# Patient Record
Sex: Female | Born: 1955
Health system: Southern US, Community
[De-identification: ages and names within clinical notes are randomized; demographics above are authoritative.]

## PROBLEM LIST (undated history)

## (undated) DIAGNOSIS — I739 Peripheral vascular disease, unspecified: Secondary | ICD-10-CM

## (undated) DIAGNOSIS — E119 Type 2 diabetes mellitus without complications: Secondary | ICD-10-CM

## (undated) DIAGNOSIS — I219 Acute myocardial infarction, unspecified: Secondary | ICD-10-CM

## (undated) DIAGNOSIS — Z91199 Patient's noncompliance with other medical treatment and regimen due to unspecified reason: Secondary | ICD-10-CM

## (undated) DIAGNOSIS — E785 Hyperlipidemia, unspecified: Secondary | ICD-10-CM

## (undated) DIAGNOSIS — I2510 Atherosclerotic heart disease of native coronary artery without angina pectoris: Secondary | ICD-10-CM

## (undated) DIAGNOSIS — L932 Other local lupus erythematosus: Secondary | ICD-10-CM

## (undated) DIAGNOSIS — F329 Major depressive disorder, single episode, unspecified: Secondary | ICD-10-CM

## (undated) DIAGNOSIS — M1990 Unspecified osteoarthritis, unspecified site: Secondary | ICD-10-CM

## (undated) DIAGNOSIS — F32A Depression, unspecified: Secondary | ICD-10-CM

## (undated) DIAGNOSIS — F1911 Other psychoactive substance abuse, in remission: Secondary | ICD-10-CM

## (undated) DIAGNOSIS — R222 Localized swelling, mass and lump, trunk: Secondary | ICD-10-CM

## (undated) DIAGNOSIS — Z9119 Patient's noncompliance with other medical treatment and regimen: Secondary | ICD-10-CM

## (undated) DIAGNOSIS — R803 Bence Jones proteinuria: Principal | ICD-10-CM

## (undated) DIAGNOSIS — I209 Angina pectoris, unspecified: Secondary | ICD-10-CM

## (undated) DIAGNOSIS — F419 Anxiety disorder, unspecified: Secondary | ICD-10-CM

## (undated) HISTORY — DX: Other local lupus erythematosus: L93.2

## (undated) HISTORY — DX: Depression, unspecified: F32.A

## (undated) HISTORY — PX: ANTERIOR CRUCIATE LIGAMENT REPAIR: SHX115

## (undated) HISTORY — DX: Peripheral vascular disease, unspecified: I73.9

## (undated) HISTORY — DX: Bence Jones proteinuria: R80.3

## (undated) HISTORY — DX: Other psychoactive substance abuse, in remission: F19.11

## (undated) HISTORY — DX: Patient's noncompliance with other medical treatment and regimen: Z91.19

## (undated) HISTORY — PX: CORONARY ANGIOPLASTY WITH STENT PLACEMENT: SHX49

## (undated) HISTORY — PX: HERNIA REPAIR: SHX51

## (undated) HISTORY — DX: Anxiety disorder, unspecified: F41.9

## (undated) HISTORY — DX: Hyperlipidemia, unspecified: E78.5

## (undated) HISTORY — PX: BREAST SURGERY: SHX581

## (undated) HISTORY — PX: TONSILLECTOMY: SUR1361

## (undated) HISTORY — DX: Atherosclerotic heart disease of native coronary artery without angina pectoris: I25.10

## (undated) HISTORY — DX: Major depressive disorder, single episode, unspecified: F32.9

## (undated) HISTORY — DX: Patient's noncompliance with other medical treatment and regimen due to unspecified reason: Z91.199

## (undated) HISTORY — PX: EXCISIONAL HEMORRHOIDECTOMY: SHX1541

---

## 1997-07-16 DIAGNOSIS — L932 Other local lupus erythematosus: Secondary | ICD-10-CM

## 1997-07-16 HISTORY — PX: ABDOMINAL HYSTERECTOMY: SHX81

## 1997-07-16 HISTORY — DX: Other local lupus erythematosus: L93.2

## 1997-12-14 ENCOUNTER — Encounter: Admission: RE | Admit: 1997-12-14 | Discharge: 1998-03-14 | Payer: Self-pay | Admitting: Orthopedic Surgery

## 1998-01-08 ENCOUNTER — Emergency Department (HOSPITAL_COMMUNITY): Admission: EM | Admit: 1998-01-08 | Discharge: 1998-01-08 | Payer: Self-pay | Admitting: Emergency Medicine

## 1998-02-01 ENCOUNTER — Ambulatory Visit (HOSPITAL_COMMUNITY): Admission: RE | Admit: 1998-02-01 | Discharge: 1998-02-01 | Payer: Self-pay | Admitting: Orthopedic Surgery

## 1998-02-15 ENCOUNTER — Encounter: Admission: RE | Admit: 1998-02-15 | Discharge: 1998-05-16 | Payer: Self-pay | Admitting: Orthopedic Surgery

## 1998-06-19 ENCOUNTER — Emergency Department (HOSPITAL_COMMUNITY): Admission: EM | Admit: 1998-06-19 | Discharge: 1998-06-19 | Payer: Self-pay | Admitting: Emergency Medicine

## 1998-07-08 ENCOUNTER — Emergency Department (HOSPITAL_COMMUNITY): Admission: EM | Admit: 1998-07-08 | Discharge: 1998-07-08 | Payer: Self-pay | Admitting: *Deleted

## 1998-10-25 ENCOUNTER — Inpatient Hospital Stay (HOSPITAL_COMMUNITY): Admission: RE | Admit: 1998-10-25 | Discharge: 1998-10-27 | Payer: Self-pay | Admitting: *Deleted

## 1999-02-21 ENCOUNTER — Encounter: Payer: Self-pay | Admitting: Family Medicine

## 1999-02-21 ENCOUNTER — Ambulatory Visit (HOSPITAL_COMMUNITY): Admission: RE | Admit: 1999-02-21 | Discharge: 1999-02-21 | Payer: Self-pay | Admitting: Family Medicine

## 1999-02-23 ENCOUNTER — Ambulatory Visit (HOSPITAL_COMMUNITY): Admission: RE | Admit: 1999-02-23 | Discharge: 1999-02-23 | Payer: Self-pay | Admitting: Family Medicine

## 1999-02-23 ENCOUNTER — Encounter: Payer: Self-pay | Admitting: Family Medicine

## 1999-06-05 ENCOUNTER — Other Ambulatory Visit: Admission: RE | Admit: 1999-06-05 | Discharge: 1999-06-05 | Payer: Self-pay | Admitting: *Deleted

## 1999-08-21 ENCOUNTER — Emergency Department (HOSPITAL_COMMUNITY): Admission: EM | Admit: 1999-08-21 | Discharge: 1999-08-21 | Payer: Self-pay | Admitting: *Deleted

## 1999-08-30 ENCOUNTER — Ambulatory Visit (HOSPITAL_COMMUNITY): Admission: RE | Admit: 1999-08-30 | Discharge: 1999-08-31 | Payer: Self-pay | Admitting: Cardiology

## 2000-03-01 ENCOUNTER — Emergency Department (HOSPITAL_COMMUNITY): Admission: EM | Admit: 2000-03-01 | Discharge: 2000-03-01 | Payer: Self-pay | Admitting: Emergency Medicine

## 2000-03-01 ENCOUNTER — Encounter: Payer: Self-pay | Admitting: Emergency Medicine

## 2000-03-26 ENCOUNTER — Encounter: Payer: Self-pay | Admitting: Emergency Medicine

## 2000-03-26 ENCOUNTER — Emergency Department (HOSPITAL_COMMUNITY): Admission: EM | Admit: 2000-03-26 | Discharge: 2000-03-26 | Payer: Self-pay | Admitting: Emergency Medicine

## 2000-05-13 ENCOUNTER — Encounter: Payer: Self-pay | Admitting: Emergency Medicine

## 2000-05-13 ENCOUNTER — Emergency Department (HOSPITAL_COMMUNITY): Admission: EM | Admit: 2000-05-13 | Discharge: 2000-05-13 | Payer: Self-pay | Admitting: Emergency Medicine

## 2000-06-11 ENCOUNTER — Other Ambulatory Visit: Admission: RE | Admit: 2000-06-11 | Discharge: 2000-06-11 | Payer: Self-pay | Admitting: *Deleted

## 2000-07-02 ENCOUNTER — Ambulatory Visit (HOSPITAL_COMMUNITY): Admission: RE | Admit: 2000-07-02 | Discharge: 2000-07-03 | Payer: Self-pay | Admitting: Orthopedic Surgery

## 2000-08-01 ENCOUNTER — Inpatient Hospital Stay (HOSPITAL_COMMUNITY): Admission: RE | Admit: 2000-08-01 | Discharge: 2000-08-04 | Payer: Self-pay | Admitting: Orthopedic Surgery

## 2000-08-13 ENCOUNTER — Encounter: Admission: RE | Admit: 2000-08-13 | Discharge: 2000-11-11 | Payer: Self-pay | Admitting: Orthopedic Surgery

## 2000-11-12 ENCOUNTER — Encounter: Admission: RE | Admit: 2000-11-12 | Discharge: 2000-11-21 | Payer: Self-pay | Admitting: Orthopedic Surgery

## 2000-11-22 ENCOUNTER — Encounter: Admission: RE | Admit: 2000-11-22 | Discharge: 2000-11-22 | Payer: Self-pay | Admitting: Orthopedic Surgery

## 2000-11-22 ENCOUNTER — Encounter: Payer: Self-pay | Admitting: Orthopedic Surgery

## 2001-01-07 ENCOUNTER — Emergency Department (HOSPITAL_COMMUNITY): Admission: EM | Admit: 2001-01-07 | Discharge: 2001-01-07 | Payer: Self-pay | Admitting: Emergency Medicine

## 2001-05-04 ENCOUNTER — Emergency Department (HOSPITAL_COMMUNITY): Admission: EM | Admit: 2001-05-04 | Discharge: 2001-05-04 | Payer: Self-pay | Admitting: Emergency Medicine

## 2001-05-25 ENCOUNTER — Emergency Department (HOSPITAL_COMMUNITY): Admission: EM | Admit: 2001-05-25 | Discharge: 2001-05-25 | Payer: Self-pay | Admitting: Emergency Medicine

## 2001-05-26 ENCOUNTER — Encounter: Payer: Self-pay | Admitting: Emergency Medicine

## 2001-05-26 ENCOUNTER — Emergency Department (HOSPITAL_COMMUNITY): Admission: EM | Admit: 2001-05-26 | Discharge: 2001-05-26 | Payer: Self-pay | Admitting: Emergency Medicine

## 2001-05-28 ENCOUNTER — Other Ambulatory Visit: Admission: RE | Admit: 2001-05-28 | Discharge: 2001-05-28 | Payer: Self-pay | Admitting: *Deleted

## 2001-05-30 ENCOUNTER — Ambulatory Visit (HOSPITAL_COMMUNITY): Admission: RE | Admit: 2001-05-30 | Discharge: 2001-05-31 | Payer: Self-pay | Admitting: Cardiology

## 2001-06-30 ENCOUNTER — Emergency Department (HOSPITAL_COMMUNITY): Admission: EM | Admit: 2001-06-30 | Discharge: 2001-06-30 | Payer: Self-pay | Admitting: *Deleted

## 2001-09-22 ENCOUNTER — Encounter: Admission: RE | Admit: 2001-09-22 | Discharge: 2001-10-22 | Payer: Self-pay | Admitting: Orthopedic Surgery

## 2002-02-22 ENCOUNTER — Emergency Department (HOSPITAL_COMMUNITY): Admission: EM | Admit: 2002-02-22 | Discharge: 2002-02-22 | Payer: Self-pay | Admitting: Emergency Medicine

## 2002-03-22 ENCOUNTER — Emergency Department (HOSPITAL_COMMUNITY): Admission: EM | Admit: 2002-03-22 | Discharge: 2002-03-22 | Payer: Self-pay | Admitting: *Deleted

## 2002-04-09 ENCOUNTER — Encounter: Admission: RE | Admit: 2002-04-09 | Discharge: 2002-04-09 | Payer: Self-pay | Admitting: *Deleted

## 2002-04-09 ENCOUNTER — Encounter: Payer: Self-pay | Admitting: *Deleted

## 2002-04-28 ENCOUNTER — Ambulatory Visit (HOSPITAL_COMMUNITY): Admission: RE | Admit: 2002-04-28 | Discharge: 2002-04-28 | Payer: Self-pay | Admitting: Orthopedic Surgery

## 2002-04-29 ENCOUNTER — Encounter: Payer: Self-pay | Admitting: Orthopedic Surgery

## 2002-05-28 ENCOUNTER — Other Ambulatory Visit: Admission: RE | Admit: 2002-05-28 | Discharge: 2002-05-28 | Payer: Self-pay | Admitting: *Deleted

## 2003-04-29 ENCOUNTER — Encounter: Payer: Self-pay | Admitting: *Deleted

## 2003-04-29 ENCOUNTER — Encounter: Admission: RE | Admit: 2003-04-29 | Discharge: 2003-04-29 | Payer: Self-pay | Admitting: *Deleted

## 2003-05-28 ENCOUNTER — Emergency Department (HOSPITAL_COMMUNITY): Admission: EM | Admit: 2003-05-28 | Discharge: 2003-05-29 | Payer: Self-pay | Admitting: Emergency Medicine

## 2003-06-03 ENCOUNTER — Other Ambulatory Visit: Admission: RE | Admit: 2003-06-03 | Discharge: 2003-06-03 | Payer: Self-pay | Admitting: *Deleted

## 2003-07-29 ENCOUNTER — Emergency Department (HOSPITAL_COMMUNITY): Admission: EM | Admit: 2003-07-29 | Discharge: 2003-07-29 | Payer: Self-pay | Admitting: Emergency Medicine

## 2003-10-09 ENCOUNTER — Emergency Department (HOSPITAL_COMMUNITY): Admission: EM | Admit: 2003-10-09 | Discharge: 2003-10-09 | Payer: Self-pay | Admitting: Emergency Medicine

## 2003-10-16 ENCOUNTER — Emergency Department (HOSPITAL_COMMUNITY): Admission: EM | Admit: 2003-10-16 | Discharge: 2003-10-16 | Payer: Self-pay | Admitting: Emergency Medicine

## 2003-10-28 ENCOUNTER — Encounter: Admission: RE | Admit: 2003-10-28 | Discharge: 2003-12-16 | Payer: Self-pay | Admitting: Orthopedic Surgery

## 2003-11-26 ENCOUNTER — Encounter: Admission: RE | Admit: 2003-11-26 | Discharge: 2003-12-07 | Payer: Self-pay | Admitting: Orthopaedic Surgery

## 2004-03-03 ENCOUNTER — Emergency Department (HOSPITAL_COMMUNITY): Admission: EM | Admit: 2004-03-03 | Discharge: 2004-03-03 | Payer: Self-pay

## 2004-05-02 ENCOUNTER — Inpatient Hospital Stay (HOSPITAL_COMMUNITY): Admission: EM | Admit: 2004-05-02 | Discharge: 2004-05-03 | Payer: Self-pay | Admitting: Emergency Medicine

## 2004-05-22 ENCOUNTER — Encounter: Admission: RE | Admit: 2004-05-22 | Discharge: 2004-05-22 | Payer: Self-pay | Admitting: *Deleted

## 2004-06-01 ENCOUNTER — Other Ambulatory Visit: Admission: RE | Admit: 2004-06-01 | Discharge: 2004-06-01 | Payer: Self-pay | Admitting: *Deleted

## 2004-10-29 ENCOUNTER — Emergency Department (HOSPITAL_COMMUNITY): Admission: EM | Admit: 2004-10-29 | Discharge: 2004-10-29 | Payer: Self-pay | Admitting: Emergency Medicine

## 2005-02-10 ENCOUNTER — Emergency Department (HOSPITAL_COMMUNITY): Admission: EM | Admit: 2005-02-10 | Discharge: 2005-02-10 | Payer: Self-pay | Admitting: Emergency Medicine

## 2005-02-18 ENCOUNTER — Emergency Department (HOSPITAL_COMMUNITY): Admission: EM | Admit: 2005-02-18 | Discharge: 2005-02-18 | Payer: Self-pay | Admitting: Emergency Medicine

## 2005-04-09 ENCOUNTER — Emergency Department (HOSPITAL_COMMUNITY): Admission: EM | Admit: 2005-04-09 | Discharge: 2005-04-10 | Payer: Self-pay | Admitting: Emergency Medicine

## 2005-06-11 ENCOUNTER — Other Ambulatory Visit: Admission: RE | Admit: 2005-06-11 | Discharge: 2005-06-11 | Payer: Self-pay | Admitting: *Deleted

## 2005-06-13 ENCOUNTER — Encounter: Admission: RE | Admit: 2005-06-13 | Discharge: 2005-06-13 | Payer: Self-pay | Admitting: *Deleted

## 2005-09-02 ENCOUNTER — Emergency Department (HOSPITAL_COMMUNITY): Admission: EM | Admit: 2005-09-02 | Discharge: 2005-09-02 | Payer: Self-pay | Admitting: Emergency Medicine

## 2005-09-04 ENCOUNTER — Ambulatory Visit (HOSPITAL_COMMUNITY): Admission: RE | Admit: 2005-09-04 | Discharge: 2005-09-04 | Payer: Self-pay | Admitting: Family Medicine

## 2006-06-13 ENCOUNTER — Other Ambulatory Visit: Admission: RE | Admit: 2006-06-13 | Discharge: 2006-06-13 | Payer: Self-pay | Admitting: *Deleted

## 2006-06-14 ENCOUNTER — Emergency Department (HOSPITAL_COMMUNITY): Admission: EM | Admit: 2006-06-14 | Discharge: 2006-06-14 | Payer: Self-pay | Admitting: Emergency Medicine

## 2006-06-17 ENCOUNTER — Encounter: Admission: RE | Admit: 2006-06-17 | Discharge: 2006-06-17 | Payer: Self-pay | Admitting: *Deleted

## 2006-07-30 ENCOUNTER — Inpatient Hospital Stay (HOSPITAL_COMMUNITY): Admission: AD | Admit: 2006-07-30 | Discharge: 2006-07-31 | Payer: Self-pay | Admitting: Cardiology

## 2006-10-08 ENCOUNTER — Encounter: Admission: RE | Admit: 2006-10-08 | Discharge: 2006-10-08 | Payer: Self-pay | Admitting: Cardiology

## 2006-11-19 ENCOUNTER — Encounter: Admission: RE | Admit: 2006-11-19 | Discharge: 2006-11-19 | Payer: Self-pay | Admitting: Family Medicine

## 2007-02-12 ENCOUNTER — Encounter: Admission: RE | Admit: 2007-02-12 | Discharge: 2007-05-13 | Payer: Self-pay | Admitting: Orthopaedic Surgery

## 2007-03-25 ENCOUNTER — Observation Stay (HOSPITAL_COMMUNITY): Admission: AD | Admit: 2007-03-25 | Discharge: 2007-03-26 | Payer: Self-pay | Admitting: Cardiology

## 2007-06-23 ENCOUNTER — Other Ambulatory Visit: Admission: RE | Admit: 2007-06-23 | Discharge: 2007-06-23 | Payer: Self-pay | Admitting: *Deleted

## 2007-07-14 ENCOUNTER — Encounter: Admission: RE | Admit: 2007-07-14 | Discharge: 2007-07-14 | Payer: Self-pay | Admitting: *Deleted

## 2008-01-28 ENCOUNTER — Emergency Department (HOSPITAL_COMMUNITY): Admission: EM | Admit: 2008-01-28 | Discharge: 2008-01-28 | Payer: Self-pay | Admitting: Emergency Medicine

## 2008-01-31 ENCOUNTER — Emergency Department (HOSPITAL_COMMUNITY): Admission: EM | Admit: 2008-01-31 | Discharge: 2008-01-31 | Payer: Self-pay | Admitting: Emergency Medicine

## 2008-03-19 ENCOUNTER — Emergency Department (HOSPITAL_BASED_OUTPATIENT_CLINIC_OR_DEPARTMENT_OTHER): Admission: EM | Admit: 2008-03-19 | Discharge: 2008-03-19 | Payer: Self-pay | Admitting: Emergency Medicine

## 2008-06-02 ENCOUNTER — Encounter: Admission: RE | Admit: 2008-06-02 | Discharge: 2008-06-02 | Payer: Self-pay | Admitting: Gastroenterology

## 2008-06-22 ENCOUNTER — Other Ambulatory Visit: Admission: RE | Admit: 2008-06-22 | Discharge: 2008-06-22 | Payer: Self-pay | Admitting: Gynecology

## 2008-08-09 ENCOUNTER — Encounter: Admission: RE | Admit: 2008-08-09 | Discharge: 2008-08-09 | Payer: Self-pay | Admitting: Gynecology

## 2009-01-03 ENCOUNTER — Inpatient Hospital Stay (HOSPITAL_COMMUNITY): Admission: EM | Admit: 2009-01-03 | Discharge: 2009-01-05 | Payer: Self-pay | Admitting: Emergency Medicine

## 2009-01-13 ENCOUNTER — Encounter (HOSPITAL_COMMUNITY): Admission: RE | Admit: 2009-01-13 | Discharge: 2009-04-13 | Payer: Self-pay | Admitting: Cardiology

## 2009-02-24 ENCOUNTER — Ambulatory Visit: Payer: Self-pay | Admitting: Surgery

## 2009-03-24 ENCOUNTER — Ambulatory Visit (HOSPITAL_COMMUNITY): Admission: RE | Admit: 2009-03-24 | Discharge: 2009-03-25 | Payer: Self-pay | Admitting: Cardiology

## 2009-08-22 ENCOUNTER — Encounter: Admission: RE | Admit: 2009-08-22 | Discharge: 2009-08-22 | Payer: Self-pay | Admitting: Gynecology

## 2009-11-19 ENCOUNTER — Emergency Department (HOSPITAL_COMMUNITY): Admission: EM | Admit: 2009-11-19 | Discharge: 2009-11-19 | Payer: Self-pay | Admitting: Emergency Medicine

## 2010-05-03 ENCOUNTER — Encounter: Admission: RE | Admit: 2010-05-03 | Discharge: 2010-05-03 | Payer: Self-pay | Admitting: Family Medicine

## 2010-05-08 ENCOUNTER — Ambulatory Visit: Payer: Self-pay | Admitting: Cardiology

## 2010-05-15 ENCOUNTER — Ambulatory Visit: Payer: Self-pay | Admitting: Cardiology

## 2010-09-15 ENCOUNTER — Other Ambulatory Visit: Payer: Self-pay | Admitting: Gynecology

## 2010-09-15 DIAGNOSIS — Z1231 Encounter for screening mammogram for malignant neoplasm of breast: Secondary | ICD-10-CM

## 2010-09-25 ENCOUNTER — Ambulatory Visit
Admission: RE | Admit: 2010-09-25 | Discharge: 2010-09-25 | Disposition: A | Discharge status: Home / Self Care | Payer: Federal, State, Local not specified - PPO | Source: Ambulatory Visit | Attending: Gynecology | Admitting: Gynecology

## 2010-09-25 DIAGNOSIS — Z1231 Encounter for screening mammogram for malignant neoplasm of breast: Secondary | ICD-10-CM

## 2010-10-09 ENCOUNTER — Other Ambulatory Visit (INDEPENDENT_AMBULATORY_CARE_PROVIDER_SITE_OTHER): Payer: Federal, State, Local not specified - PPO | Admitting: *Deleted

## 2010-10-09 ENCOUNTER — Other Ambulatory Visit: Payer: Self-pay | Admitting: Cardiology

## 2010-10-09 ENCOUNTER — Telehealth: Payer: Self-pay | Admitting: *Deleted

## 2010-10-09 DIAGNOSIS — R0789 Other chest pain: Secondary | ICD-10-CM

## 2010-10-09 DIAGNOSIS — Z79899 Other long term (current) drug therapy: Secondary | ICD-10-CM

## 2010-10-09 NOTE — Telephone Encounter (Signed)
Pt called, c/o bruising for several weeks and today pt developed a nose bleed.  Pt is unsure if bruising is coming from her Lupus or Plavix.  No other complaints.  Norma Fredrickson NP notified and instructed RN to have pt come in today for CBC.  Pt notified and will come in at 11 am.    KSpencer RN

## 2010-10-10 ENCOUNTER — Telehealth: Payer: Self-pay | Admitting: *Deleted

## 2010-10-10 LAB — CBC WITH DIFFERENTIAL/PLATELET
Eosinophils Relative: 1 % (ref 0–5)
HCT: 37.7 % (ref 36.0–46.0)
Lymphocytes Relative: 55 % — ABNORMAL HIGH (ref 12–46)
Lymphs Abs: 3.4 10*3/uL (ref 0.7–4.0)
MCV: 92.9 fL (ref 78.0–100.0)
Monocytes Absolute: 0.4 10*3/uL (ref 0.1–1.0)
Neutro Abs: 2.4 10*3/uL (ref 1.7–7.7)
Platelets: 291 10*3/uL (ref 150–400)
RBC: 4.06 MIL/uL (ref 3.87–5.11)
WBC: 6.2 10*3/uL (ref 4.0–10.5)

## 2010-10-10 NOTE — Telephone Encounter (Signed)
RN informed pt of lab results and instructed pt to cut back Plavix to every other day.  Also, pt told to remain on Aspirin 81 mg.  Pt will call back in two weeks and let us know how bruising is and if pt has had any more nose bleeds.

## 2010-10-10 NOTE — Telephone Encounter (Signed)
Message copied by Barnetta Hammersmith on Tue Oct 10, 2010  9:55 AM ------      Message from: Norma Fredrickson      Created: Tue Oct 10, 2010  8:27 AM       Hemoglobin and platelets are ok.If no stent in the past 12 months, then cut back the Plavix to every other day. Continue the low dose aspirin.

## 2010-10-20 LAB — COMPREHENSIVE METABOLIC PANEL
ALT: 21 U/L (ref 0–35)
Alkaline Phosphatase: 66 U/L (ref 39–117)
BUN: 13 mg/dL (ref 6–23)
CO2: 29 mEq/L (ref 19–32)
Chloride: 109 mEq/L (ref 96–112)
GFR calc non Af Amer: >60 mL/min (ref >60)
Glucose, Bld: 101 mg/dL — ABNORMAL HIGH (ref 70–99)
Potassium: 3.6 mEq/L (ref 3.5–5.1)
Sodium: 145 mEq/L (ref 135–145)
Total Bilirubin: 0.3 mg/dL (ref 0.3–1.2)

## 2010-10-20 LAB — POCT I-STAT, CHEM 8
Calcium, Ion: 1.23 mmol/L (ref 1.12–1.32)
Chloride: 110 mEq/L (ref 96–112)
Creatinine, Ser: 0.5 mg/dL (ref 0.4–1.2)
Glucose, Bld: 95 mg/dL (ref 70–99)
HCT: 36 % (ref 36.0–46.0)

## 2010-10-20 LAB — CBC
HCT: 34.7 % — ABNORMAL LOW (ref 36.0–46.0)
Hemoglobin: 11.4 g/dL — ABNORMAL LOW (ref 12.0–15.0)
RBC: 3.75 MIL/uL — ABNORMAL LOW (ref 3.87–5.11)
RDW: 14.8 % (ref 11.5–15.5)
WBC: 4.7 10*3/uL (ref 4.0–10.5)

## 2010-10-20 LAB — POCT I-STAT 3, ART BLOOD GAS (G3+)
Acid-base deficit: 1 mmol/L (ref 0.0–2.0)
Bicarbonate: 22.6 mEq/L (ref 20.0–24.0)
pCO2 arterial: 34 mmHg — ABNORMAL LOW (ref 35.0–45.0)
pO2, Arterial: 156 mmHg — ABNORMAL HIGH (ref 80.0–100.0)

## 2010-10-20 LAB — CARDIAC PANEL(CRET KIN+CKTOT+MB+TROPI)
CK, MB: 1 ng/mL (ref 0.3–4.0)
CK, MB: 1.2 ng/mL (ref 0.3–4.0)
Total CK: 93 U/L (ref 7–177)
Troponin I: 0.01 ng/mL (ref 0.00–0.06)

## 2010-10-20 LAB — D-DIMER, QUANTITATIVE: D-Dimer, Quant: <0.22 ug/mL-FEU (ref 0.00–0.48)

## 2010-10-23 ENCOUNTER — Telehealth: Payer: Self-pay | Admitting: Cardiology

## 2010-10-23 LAB — CBC
HCT: 38.5 % (ref 36.0–46.0)
Hemoglobin: 12.7 g/dL (ref 12.0–15.0)
MCV: 91 fL (ref 78.0–100.0)
MCV: 92.1 fL (ref 78.0–100.0)
Platelets: 270 10*3/uL (ref 150–400)
Platelets: 278 10*3/uL (ref 150–400)
Platelets: 300 10*3/uL (ref 150–400)
RBC: 3.83 MIL/uL — ABNORMAL LOW (ref 3.87–5.11)
RDW: 14.1 % (ref 11.5–15.5)
RDW: 14.1 % (ref 11.5–15.5)
WBC: 3.8 10*3/uL — ABNORMAL LOW (ref 4.0–10.5)
WBC: 4.2 10*3/uL (ref 4.0–10.5)

## 2010-10-23 LAB — DIFFERENTIAL
Basophils Absolute: 0 10*3/uL (ref 0.0–0.1)
Basophils Absolute: 0 10*3/uL (ref 0.0–0.1)
Basophils Relative: 0 % (ref 0–1)
Basophils Relative: 1 % (ref 0–1)
Eosinophils Absolute: 0 10*3/uL (ref 0.0–0.7)
Eosinophils Relative: 1 % (ref 0–5)
Lymphs Abs: 2.8 10*3/uL (ref 0.7–4.0)
Monocytes Absolute: 0.3 10*3/uL (ref 0.1–1.0)
Neutrophils Relative %: 27 % — ABNORMAL LOW (ref 43–77)

## 2010-10-23 LAB — CK TOTAL AND CKMB (NOT AT ARMC)
CK, MB: 1 ng/mL (ref 0.3–4.0)
CK, MB: 1.4 ng/mL (ref 0.3–4.0)
Relative Index: INVALID (ref 0.0–2.5)
Relative Index: INVALID (ref 0.0–2.5)
Total CK: 71 U/L (ref 7–177)

## 2010-10-23 LAB — COMPREHENSIVE METABOLIC PANEL
AST: 26 U/L (ref 0–37)
Albumin: 4.1 g/dL (ref 3.5–5.2)
Alkaline Phosphatase: 76 U/L (ref 39–117)
BUN: 13 mg/dL (ref 6–23)
CO2: 22 mEq/L (ref 19–32)
Chloride: 109 mEq/L (ref 96–112)
Creatinine, Ser: 0.6 mg/dL (ref 0.4–1.2)
GFR calc non Af Amer: >60 mL/min (ref >60)
Potassium: 3.9 mEq/L (ref 3.5–5.1)
Total Bilirubin: 0.4 mg/dL (ref 0.3–1.2)

## 2010-10-23 LAB — TROPONIN I: Troponin I: 0.02 ng/mL (ref 0.00–0.06)

## 2010-10-23 LAB — HEPARIN LEVEL (UNFRACTIONATED)
Heparin Unfractionated: 1.07 IU/mL — ABNORMAL HIGH (ref 0.30–0.70)
Heparin Unfractionated: 1.32 IU/mL — ABNORMAL HIGH (ref 0.30–0.70)
Heparin Unfractionated: <0.1 IU/mL — ABNORMAL LOW (ref 0.30–0.70)

## 2010-10-23 LAB — BASIC METABOLIC PANEL
Calcium: 9.3 mg/dL (ref 8.4–10.5)
Creatinine, Ser: 0.74 mg/dL (ref 0.4–1.2)
GFR calc Af Amer: >60 mL/min (ref >60)
GFR calc non Af Amer: >60 mL/min (ref >60)

## 2010-10-23 LAB — PROTIME-INR: Prothrombin Time: 11.7 seconds (ref 11.6–15.2)

## 2010-10-23 NOTE — Telephone Encounter (Signed)
[name], Ask Weyman Croon if she can just go to QOD schedule for her Plavix. She has not had a recent stent if I remember correctly.

## 2010-10-23 NOTE — Telephone Encounter (Signed)
Pt called, to inform RN that bruising is completely gone with the Plavix 75 mg every other day.  RN advised pt to continue Plavix 75 mg every other day.

## 2010-10-23 NOTE — Telephone Encounter (Signed)
Follow-up two week call question about continued use of Plavix after symptoms cleared.

## 2010-11-28 NOTE — Discharge Summary (Signed)
[name], [name] NO.:  [id number]   MEDICAL RECORD NO.:  [id number]          PATIENT TYPE:  INP   LOCATION:  3729                         FACILITY:  [facility]   PHYSICIAN:  Elmore Guise., M.D.DATE OF BIRTH:  July 30, 1955   DATE OF ADMISSION:  03/25/2007  DATE OF DISCHARGE:  03/26/2007                               DISCHARGE SUMMARY   DISCHARGE DIAGNOSES:  1. History of coronary artery disease.  2. Dyslipidemia.  3. Cutaneous lupus.  4. Anxiety and depression.  5. Tobacco dependence.  6. History of noncompliance.   HISTORY OF PRESENT ILLNESS:  [name] a very pleasant 55 year old  African American female who presented with increased stress and  substernal chest pain.  She was admitted for observation and to rule out  myocardial infarction.   HOSPITAL COURSE:  The patient's hospital course was uncomplicated.  On  arrival to the hospital the patient had significant improvement in her  symptoms.  With rest, she had no recurrence of her chest pain.  She  ruled out for myocardial function and serial EKGs showed no significant  changes.  She has now been up and ambulatory, back on her normal  medications with no further symptoms.  Her last cardiac evaluation was  earlier this year when she underwent cardiac catheterization showing  continued small vessel disease.  She will have further workup as an  outpatient.   DISCHARGE MEDICATIONS:  1. Aspirin 81 mg daily.  2. Vytorin 10/20 mg daily.  3. Plaquenil 200 mg daily.  4. Lexapro 10 mg daily.  5. Toprol XL 50 mg daily.  6. Nitroglycerin p.r.n. chest pain.  7. Ambien p.r.n. insomnia.   FOLLOW UP:  Her follow-up appointment will be with Dr. Tamera Reason cardiology on October 10.  She is to call the office with any  questions or concerns.  I did discuss the importance of  medication  compliance as well as total cessation of all tobacco products.  The  patient understands the importance of all of  these instructions.      Elmore Guise., M.D.  Electronically Signed    TWK/MEDQ  D:  03/27/2007  T:  03/27/2007  Job:  [id number]

## 2010-11-28 NOTE — Discharge Summary (Signed)
[name], [name] NO.:  [id number]   MEDICAL RECORD NO.:  [id number]          PATIENT TYPE:  INP   LOCATION:  2502                         FACILITY:  [facility]   PHYSICIAN:  Colleen Can. Deborah Chalk, M.D.DATE OF BIRTH:  03/16/1956   DATE OF ADMISSION:  01/03/2009  DATE OF DISCHARGE:  01/05/2009                               DISCHARGE SUMMARY   DISCHARGE DIAGNOSES:  1. Chest pain with subsequent elective cardiac catheterization and      subsequent stenting x2 to the right coronary artery.  2. Ongoing substance abuse.  3. Cutaneous lupus.  4. Anxiety and depression.  5. Hyperlipidemia, on Vytorin.  6. Known ischemic heart disease with remote PCI to the RCA.   HISTORY OF PRESENT ILLNESS:  [name] presented for admission after having  an approximate 1-week episode of neck burning.  She had been seen in the  office the week prior to admission with a burning sensation in her neck  and it had started after eating a pastrami sandwich.  She was placed on  proton pump inhibitor.  Her EKG was unchanged.  Her CK and troponin were  unremarkable.  Her symptoms were very different from her previous chest  pain syndrome.  She subsequently saw GI, and had a negative evaluation.  She subsequently presented with exertional neck burning and had more ST  and T wave changes inferiorly, and subsequently was admitted from the  office for further evaluation.   Please see the history and physical for further patient presentation and  profile.   LABORATORY DATA ON ADMISSION:  CBC was normal.  Chemistries were normal  except for glucose of 103.  Her cardiac enzymes were negative.  Her  chest x-ray showed slight hyperaeration and borderline cardiomegaly with  no active lung disease.   HOSPITAL COURSE:  The patient was admitted.  She was placed on IV  nitroglycerin and IV heparin.  Her cardiac enzymes were negative.  We  proceeded on with cardiac catheterization the following day.  That  procedure was tolerated well without any no known complications.  The  left main coronary artery was normal.  The LAD was normal.  The left  circumflex had 50-60% narrowings.  The intermediate was normal.  The  right coronary artery was 100% occluded prior to the old stent.  Subsequently 2 stents were placed with a 2.75 x 23 mm distal and a 2.75  x 28 mm proximal.  Overall, satisfactory result was obtained.  Postprocedure, she was transferred to 2500 and today on January 05, 2009,  she is doing well without complaints.  She has been up and ambulatory  without problems, groin unremarkable.  Postprocedure lab shows a glucose  of 127, BUN was 8, and creatinine 0.7.  Her white count was 3.8 and  platelets are 270.  Her hematocrit was 35.  Overall, she is felt to be a  satisfactory candidate for discharge today.   DISCHARGE CONDITION:  Stable.   DISCHARGE DIET:  Low-salt, heart-healthy.   WOUND CARE:  She is use an ice pack if needed to the groin.   She is not  to engage in any heavy lifting for 1 week.  She may shower  and bathe and her activity is to be increased as tolerated.   We will have her return to work next Tuesday.  We will plan on seeing  her back in the office in approximately 2 weeks and she is asked to call  to set that appointment up.   DISCHARGE MEDICINES:  1. Enteric-coated aspirin 325 a day.  2. Vytorin 10-40 daily.  3. Toprol-XL 25 mg a day.  4. Plavix 75 mg a day for the next 1 year.  5. Ambien p.r.n.  6. Nitroglycerin p.r.n.   She is to call if any problems arise in the interim.   Greater than 30 minutes spent for discharge.      [name], N.P.      Colleen Can. Deborah Chalk, M.D.  Electronically Signed    LC/MEDQ  D:  01/05/2009  T:  01/05/2009  Job:  [id number]

## 2010-11-28 NOTE — H&P (Signed)
[name], [name]               ACCOUNT NO.:  [id number]   MEDICAL RECORD NO.:  [id number]           PATIENT TYPE:  INP   LOCATION:                               FACILITY:  [facility]   PHYSICIAN:  Colleen Can. Deborah Chalk, M.D.DATE OF BIRTH:  01/24/1956   DATE OF ADMISSION:  01/03/2009  DATE OF DISCHARGE:                              HISTORY & PHYSICAL   CHIEF COMPLAINT:  Chest pain.   HISTORY OF PRESENT ILLNESS:  [name] comes into the office today as a work-  in appointment for recurrent chest pain.  She was seen last week with a  burning sensation in her neck.  That had started after eating a pastrami  sandwich.  She was placed on proton pump inhibitor.  She has had a  negative GI evaluation.  Now she is presenting with exertional neck  discomfort.  She has more ST and T-wave changes inferiorly.  She is  subsequently admitted for further evaluation.  At the time of her visit`  today, she is currently pain free.   PAST MEDICAL HISTORY:  1. Known ischemic heart disease.  She had remote angioplasty to the      right coronary in February 2001.  She had stent placement to the      right coronary in November 2002.  Her last catheterization was in      January 2008 and at that time showed an EF of 55-60%, totally      occluded continuation branch of the left circumflex in the AV      groove with right to left collaterals, 50-60% narrowing in the      proximal portion of the left circumflex, 20-30% narrowing in the      right coronary after the acute margin, but before the stent that      was previously placed with persistent patency of the stent in the      right coronary.  Her findings suggested that she could be safely      managed medically.  2. Ongoing substance abuse.  3. Cutaneous lupus.  4. Anxiety and depression.  5. Hyperlipidemia.  6. Past history of noncompliance.  7. History of Raynaud's.  8. History of iritis.   SURGICAL HISTORY:  1. Hemorrhoidectomy.  2. Partial  hysterectomy in 1999 and subsequent total hysterectomy in      2000.  3. Right knee surgery in 1998 and 1999.  4. Left inguinal hernia surgery.  5. Benign breast lump excision on the left.  6. Ventral hernia x2 in the past.   ALLERGIES:  None known.   CURRENT MEDICATIONS:  1. Aspirin which she takes occasionally.  2. Vytorin 10/40 daily.  3. Toprol 25 mg a day.  4. Ambien p.r.n.  5. Nexium 40 mg a day.  6. Imdur 30 mg a day as well.   FAMILY HISTORY:  Father died at 55 with heart disease, hypertension and  stroke.  He actually died of heart failure.  Her mother died at age 33  with a history of lung cancer and hypertension.  She states she has  stopped  smoking cigarettes but she does smoke marijuana and she does  have social alcohol use.   REVIEW OF SYSTEMS:  She has had no recent fever, flu or chills.  She has  had no cough.  She is not short of breath.  Her chest pain is as  described above.  It is clearly different from her previous chest pain  syndrome, but is now exertional in nature.  She has had no significant  belching or burping.  She had no abdominal pain.  No constipation or  diarrhea.  Otherwise, all other review of systems are negative.   PHYSICAL EXAMINATION:  GENERAL:  She is pleasant.  She is in no acute  distress and she states she is pain free.  VITAL SIGNS:  Blood pressure is 110/80 sitting, 108/80 standing, heart  rate 52 and regular.  Her weight 126 pounds and she is 4 feet 11 inches.  SKIN:  Warm and dry.  Color is unremarkable.  HEENT:  Normocephalic, atraumatic.  Pupils are equal and reactive.  Conjunctiva is clear.  Sclerae is nonicteric.  NECK:  Supple without masses and no JVD.  LUNGS:  Clear to auscultate.  She is not short of breath.  CARDIAC:  Exam shows a regular rhythm.  No murmur.  ABDOMEN:  Soft positive bowel sounds, nontender.  EXTREMITIES: Without edema.  MUSCULOSKELETAL:  Shows gait and range of motion to be intact.  Strength  is  symmetric.  NEUROLOGIC:  Shows no gross focal deficits.   PERTINENT LABORATORY DATA:  EKG shows worsening ST and T-wave changes  inferiorly.  Other labs are pending.   OVERALL IMPRESSION:  1. Atypical chest pain, question of unstable angina.  2. Abnormal EKG.  3. Known ischemic heart disease.   PLAN:  We will proceed on with admission to the hospital.  She will play  be placed on IV nitroglycerin and IV heparin.  Will make plans for her  to undergo repeat cardiac catheterization tomorrow afternoon per Dr.  Deborah Chalk.  Home medicines will be continued.      [name], N.P.      Colleen Can. Deborah Chalk, M.D.  Electronically Signed    LC/MEDQ  D:  01/03/2009  T:  01/03/2009  Job:  [id number]

## 2010-11-28 NOTE — Cardiovascular Report (Signed)
NAMEDONN, Krista NO.:  [id number]   MEDICAL RECORD NO.:  [id number]          PATIENT TYPE:  INP   LOCATION:  2502                         FACILITY:  [facility]   PHYSICIAN:  Colleen Can. Deborah Chalk, M.D.DATE OF BIRTH:  [birth year]   DATE OF PROCEDURE:  01/04/2009  DATE OF DISCHARGE:                            CARDIAC CATHETERIZATION   [name] is a 55 year old female with history of previous angioplasty  of right coronary artery in 2002.  She has had ongoing cigarette abuse,  a history of cutaneous lupus.  She has a history of previous  noncompliance.  She presents with ST and T wave changes inferiorly and  was admitted for cardiac catheterization and possible angioplasty.   PROCEDURES:  1. Left heart catheterization with selective coronary angiography.  2. Left ventricular angiography.  3. Angioplasty and stent placed in the right coronary artery.   TYPE AND SITE OF ENTRY:  Percutaneous right femoral artery with Angio-  Seal.   CATHETERS:  A 6-French 4 curve Judkins right-left coronary cath, 6-  French pigtail ventriculographic catheter, 6-French 3.5 cm curve with  side holes guide, Prowater guidewire, initially a 2.5 x 12 mm Apex  balloon, subsequent stents being a 2.75 x 23 mm Xience stent (drug  eluting) distally with a 2.75 x 28 mm Xience stent more proximal  location but in one long segmental area just prior to the crux.   MEDICATIONS GIVEN PRIOR TO PROCEDURE:  Valium 10 mg p.o.   MEDICATIONS GIVEN DURING THE PROCEDURE:  IV nitro, Plavix, Angiomax, and  Versed.   COMMENTS:  The patient tolerated the procedure well.   HEMODYNAMIC DAT:  The aortic pressure is 152/85, LV is 157/2.  There is  no aortic valve gradient on pullback.   ANGIOGRAPHIC DATA:  Left ventricular angiogram was performed in RAO  position.  Overall cardiac size and silhouette were normal.  Global  ejection fraction is 60%.   Coronary arteries.  The coronary arteries arise and in  general  distribute normal, but there is a high atrial branch that gives  collaterals from the right coronary artery to the continuation of the  left circumflex and AV groove.   1. Left main coronary artery is normal.  2. Left anterior descending is essentially normal, wraps around the      apex.  3. Left circumflex has main continuation as a marginal vessel.  It has      a 50-60% narrowing proximally.  4. Intermediate coronary is normal.  5. Right coronary artery.  The right coronary artery is totally      occluded just prior to the crux.  It is prior to the previous      placed stent.  There is mild 20-30% narrowing in the proximal      portions of the right coronary artery.   ANGIOPLASTY PROCEDURE:  Using a 6-French 3.5 guide with side holes, we  had satisfactory backup.  We easily crossed the occluded portion of the  right coronary artery with Prowater guidewire.  There was a long segment  of stenotic area that was somewhat surprising from the  crux back to the  acute margin of the heart.  Once all of these areas were dilated, we had  satisfactory distal flow and the collaterals from the left coronary  system that were previously noted were reversed.  We initially placed a  2.75 x 23 mm Xience stent distally, inflated it to 14 atmospheres.  We  returned to place a 28 mm x 2.75 Xience stent in the more proximal  location and overlap that approximately 2-4 mm.  This supplied a long  area of stented vessel but it was satisfactory.  I went back to what  would appear to be normal vessel.  The distal flow was TIMI grade 3.  Intracoronary nitroglycerin was given.   OVERALL IMPRESSION:  1. Totally occluded right coronary artery with left-to-right      collaterals initially.  2. Successful stent placement in the right coronary artery distally      with 2 sequential 28 and then subsequent 23 mm stent that were 2.75      mm in diameter and drug eluting.  3. Normal left ventricular  function.  4. Moderate coronary atherosclerosis in the left circumflex system      with the presence of right-to-left collaterals by an atrial branch      to a continuation of left circumflex.   DISCUSSION:  [name] will need to be on Plavix certainly for a year and  perhaps even longer term than that.  She does have moderate  atherosclerosis in the left circumflex and we will need to have  aggressive risk factor modification.      Colleen Can. Deborah Chalk, M.D.  Electronically Signed     SNT/MEDQ  D:  01/04/2009  T:  01/05/2009  Job:  [id number]

## 2010-11-28 NOTE — H&P (Signed)
NAMELAPORCHE, MARTELLE              ACCOUNT NO.:  [id number]   MEDICAL RECORD NO.:  [id number]          PATIENT TYPE:  INP   LOCATION:  3729                         FACILITY:  [facility]   PHYSICIAN:  Colleen Can. Deborah Chalk, M.D.DATE OF BIRTH:  08-Jan-1956   DATE OF ADMISSION:  03/25/2007  DATE OF DISCHARGE:                              HISTORY & PHYSICAL   CHIEF COMPLAINT:  Chest pain.   HISTORY OF PRESENT ILLNESS:  [name] is a 55 year old black female who  has known ischemic heart disease.  She has ongoing substance abuse as  well as hyperlipidemia.  She has been noncompliant with medicines.  She  presents to the office as a work-in appointment earlier this morning  from work with complaints of exertional chest pain.  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  She began having 8/10 chest discomfort  located over the left breast.  It is clearly exertional in nature.  She  has taken nitroglycerin x2 with reduction of the intensity.  She  continues to have discomfort at rest.  It has been associated with  diaphoresis.  She has not been short of breath and had no nausea or  vomiting.  She admits that she has only been taking her Toprol  occasionally.  Lipid lowering medicines have only been taken on an  occasional basis as well.  She has been under more stress at home.  Unfortunately, she continues to smoke about 1/2 pack of cigarettes a  day.   PAST MEDICAL HISTORY:  1. Known ischemic heart disease.  She had a previous admission in      January 2008 for chest pain that was consistent with unstable      angina.  She had cardiac catheterization showing totally occluded      continuation branch of the left circumflex in the A-V groove with      right-to-left collaterals, 50-60% narrowing in the proximal      portions of the left circumflex with 20-30% narrowing in the right      coronary.  She was started on medical management.  2. Ongoing substance abuse.  3. Hyperlipidemia.  4.  Cutaneous lupus.  5. Anxiety and depression.   ALLERGIES:  None.   CURRENT MEDICINES:  1. Baby aspirin daily.  2. Plaquenil 200 mg daily.  3. Vytorin 10/20 occasionally.  4. Tylenol p.r.n.  5. Toprol XL 25 mg taken occasionally.   SOCIAL HISTORY:  She is single.  She smokes 1/2 pack of cigarettes a  day.  She has had past marijuana use.  She does consume alcohol.  She  denies cocaine.   FAMILY HISTORY:  Her father died at 85 with heart disease, hypertension,  and stroke and actually died of heart failure.  Her mother died at age  59 with lung cancer and hypertension.   REVIEW OF SYSTEMS:  As noted above.   EXAM:  She is a pleasant black female.  She is currently in no acute  distress.  Her weight is 110 pounds.  Blood pressure is 120/88, heart rate 68 and  regular, respirations are  18.  She is afebrile.  Skin is warm and dry.  Color is unremarkable.  LUNGS:  Somewhat coarse.  CARDIAC:  Regular rhythm.  She has no chest wall tenderness.  ABDOMEN:  Soft.  EXTREMITIES:  Without edema.  NEUROLOGIC:  No gross focal deficits.   PERTINENT LABS:  Her EKG shows inferior T wave changes.   OVERALL IMPRESSION:  1. Exertional chest pain.  2. Known ischemic heart disease.  3. Ongoing substance abuse.  4. History of noncompliance.   PLAN:  She will be admitted to the hospital.  IV nitroglycerin will be  initiated as well as subcutaneous Lovenox.  We will check serial cardiac  enzymes.  Medicines will be restarted.  Toprol will actually be  increased to 50 mg as long as her blood pressure and heart rate will  support that.      [name], N.P.      Colleen Can. Deborah Chalk, M.D.  Electronically Signed    LC/MEDQ  D:  03/25/2007  T:  03/25/2007  Job:  [id number]

## 2010-12-01 NOTE — Discharge Summary (Signed)
NAMEGLENDI, [name] NO.:  [id number]   MEDICAL RECORD NO.:  [id number]          PATIENT TYPE:  INP   LOCATION:  3705                         FACILITY:  [facility]   PHYSICIAN:  Elmore Guise., M.D.DATE OF BIRTH:  1956/02/21   DATE OF ADMISSION:  05/02/2004  DATE OF DISCHARGE:  05/03/2004                                 DISCHARGE SUMMARY   DISCHARGE DIAGNOSES:  1.  Atypical chest pain.  2.  History of coronary artery disease status post percutaneous coronary      intervention 2002.  3.  Dyslipidemia.  4.  Tobacco dependence.  5.  Depression.  6.  History of substance abuse.   HISTORY OF PRESENT ILLNESS:  The patient is a very pleasant 55 year old  African-American female admitted on May 02, 2004 after an episode of  chest pain while playing bingo.  The patient reports increased stress over  the last couple months in taking care of her nieces and nephews.  She had  stopped most of her medications and her chief complaints are really dry  eyes.  She has no exertional kind of symptoms.  She reported left-sided  chest pain after a game of bingo yesterday.  She went home directly.  Had no  exertional worsening.  Tried to take a nitroglycerin, however, had no  available medications there, so called 911.  She had total relief of her  chest pain after one nitroglycerin.   HOSPITAL COURSE:  The patient was admitted for observation.  She remained  chest pain free.  Her serial EKGs and serial enzymes were all negative for  any type of heart involvement.  She was counseled on tobacco cessation as  well as avoiding any illegal substances.  She will be discharged on the  following medications.   1.  Aspirin 325 mg daily.  2.  Crestor 10 mg daily.  3.  Lexapro 10 mg daily.  4.  Nitroglycerin spray on a p.r.n. basis.   Pain management is nonapplicable.   Activity is as tolerated.  However, we will limit all strenuous activity and  work related activity until she  has a stress test.  Her diet is low  cholesterol, low fat.  Special instructions were for tobacco cessation.  She  will follow up for stress test at 8:45 on October 21 to evaluate for any  inducible ischemia.  Should she have any further problems, she will notify  the office for further treatment and recommendations.      TWK/MEDQ  D:  05/03/2004  T:  05/03/2004  Job:  [id number]

## 2010-12-01 NOTE — Cardiovascular Report (Signed)
NAMEJONETTE, [name] NO.:  [id number]   MEDICAL RECORD NO.:  [id number]          PATIENT TYPE:  INP   LOCATION:  2040                         FACILITY:  [facility]   PHYSICIAN:  Colleen Can. Deborah Chalk, M.D.DATE OF BIRTH:  August 26, 1955   DATE OF PROCEDURE:  07/31/2006  DATE OF DISCHARGE:  07/31/2006                            CARDIAC CATHETERIZATION   HISTORY:  [name] is a 55 year old female with a history of inferior  myocardial infarction.  She has had previous stent in her distal right  coronary artery.  She presents with worsening chest pain and angina.  She has ongoing cigarette abuse.   PROCEDURE:  Left heart catheterization with selective coronary  angiography, left ventricular angiography.   TYPE AND SITE OF ENTRY:  Percutaneous right femoral artery with Angio-  Seal.   CATHETER:  6-French 4 curved Judkins right and left coronary catheter, 6-  French pigtail ventriculographic catheter.   CONTRAST MATERIAL:  Omnipaque.   MEDICATIONS:  Given prior procedure, Valium 10 mg.   MEDICATIONS:  Given during the procedure, Versed 2 mg IV.   COMMENT:  The patient tolerated the procedure well.   HEMODYNAMIC DATA:  The aortic pressure was 99/65, LV was 123/2-14.  There is no aortic valve gradient on pullback.   ANGIOGRAPHIC DATA:  1. Right coronary artery.  The right coronary artery is a dominant      vessel.  There is 30-40% irregularities distal to the acute margin.      There is a stent between this area of segmental disease and the      crux.  The stent is widely patent.  Beyond the stent, there is a      posterior descending and posterior lateral branch.  Posterior      lateral branch gives extensive collaterals to the distal left      circumflex vessel that is occluded from the more proximal portions      of the left circumflex.  There is a secondary source of collaterals      to this circumflex portion via an atrial branch off more proximal      portions of  the right coronary artery.  2. Left main coronary artery is normal.  3. Intermediate coronary. There is moderate size intermediate coronary      and is free of significant disease.  4. Left anterior descending. Left anterior descending extends to and      wraps around the apex.  There are small diagonal vessels.  Left      anterior descending is free of significant disease.  5. Left circumflex. Left circumflex continues primarily as a large      obtuse marginal.  The continuation branch of left circumflex is      totally occluded.  It receives collaterals from the right coronary      artery.  In the more proximal portion of the left circumflex, there      is a 50-60% narrowing and tortuosity.   Left ventricular angiogram was performed in RAO position.  Overall  cardiac size and silhouette normal.  There is a discrete area  of  inferior basal hypokinesia.  The global ejection fraction would be  estimated at 55-60%.  There is no mitral regurgitation.   OVERALL IMPRESSION:  1. Totally occluded continuation branch of left circumflex in the AV      groove with right to left collaterals.  2. There is 50-60% narrowing in the proximal portions of the left      circumflex.  3. There is 20-30% narrowings in the right coronary artery after the      acute margin but before the stent that was previously placed with      persistent patency of this stent to the right coronary artery that      was previously placed just before the crux.   DISCUSSION:  These findings would suggest that [name] can be safely  managed medically.  We will encourage smoking cessation as well as  medical management.      Colleen Can. Deborah Chalk, M.D.  Electronically Signed     SNT/MEDQ  D:  07/31/2006  T:  08/01/2006  Job:  [id number]

## 2010-12-01 NOTE — H&P (Signed)
[place]. [facility]  Patient:    [name], [name] Visit Number: [id number] MRN: [id number]          Service Type: EMS Location: ED Attending Physician:  Hanley Seamen Dictated by:   Jennet Maduro Earl Gala, R.N., A.N.P.-C. Admit Date:  05/26/2001 Discharge Date: 05/26/2001   CC:         [facility]  Demetria Pore. Coral Spikes, M.D.   History and Physical  CHIEF COMPLAINT:  Recurrent exertional chest discomfort.  HISTORY OF PRESENT ILLNESS:  [name] is a 55 year old black female who has known coronary disease.  She had angioplasty to the right coronary artery in February 2001.  She had last been seen in our office in August 2001 and was basically felt to be stable from a cardiac standpoint at that point in time. She has continued to smoke cigarettes and presents to the office for evaluation on May 27, 2001 with complaints of exertional chest discomfort.  She notes that over the past several weeks - even possibly to the past several months - that she has begun having chest tightness that occurs with walking. It is associated with some shortness of breath.  The discomfort is identical to her previous chest pain syndrome.  However, before, she had considerable shortness of breath.  She is continuing tobacco abuse as well as marijuana. There is no other substance abuse reported.  She has been placed on Pletal for reported lower extremity vascular compromise.  She was subsequently seen in the office and referred on for elective coronary angiography.  PAST MEDICAL HISTORY: 1. Atherosclerotic cardiovascular disease.  She had angioplasty to the right    coronary artery on August 30, 1999.  Her other findings at that time    showed mild inferior basilar hypokinesis, two-vessel coronary disease,    with a totally occluded distal right coronary with subsequent angioplasty,    totally occluded distal left circumflex with collaterals from the right  coronary artery via way of atrial branches, normal right heart pressures,    and a relatively low cardiac output. 2. Hypercholesterolemia, off statin therapy. 3. Ongoing tobacco abuse. 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 5. Status post right knee surgery followed by Dr. Montez Morita.  ALLERGIES:  None known.  CURRENT MEDICATIONS: 1. Aspirin daily. 2. Premarin daily. 3. Celebrex p.r.n. 4. Plaquenil 200 mg a day.  FAMILY HISTORY:  Both of her parents are deceased.  Father had heart disease, hypertension, as well as stroke and subsequently died with heart failure.  Her mother died at the age of 84 due to hypertension and liver cancer.  SOCIAL HISTORY:  She is single.  She is working for the [employer] and is currently on [employer] due to a previous knee injury.  She is continuing to smoke cigarettes as well as marijuana.  She does drink occasional beer.  REVIEW OF SYSTEMS:  Is basically as noted above.  She was seen and evaluation in the emergency room department on May 26, 2001 and reportedly had a negative evaluation.  She has had no fever, flu, or cough.  No real shortness of breath.  No palpitations.  Her chest pain is as described above and is identical to her previous chest pain syndrome, and otherwise review of systems is unremarkable.  PHYSICAL EXAMINATION:  GENERAL:  She is a thin black female who appears younger than her stated age.  VITAL SIGNS:  Blood pressure 90/50 sitting, 110/70 standing.  Heart rate 72 and regular.  Weight is 114 pounds.  She is afebrile.  SKIN:  Warm and dry.  Color is unremarkable.  NECK:  Supple.  There is no JVD, no bruits, no lymphadenopathy, no thyromegaly.  LUNGS:  Clear to auscultation.  There is no dyspnea noted.  BACK:  Unremarkable.  CARDIOVASCULAR:  Shows a split S2.  There was no murmur that could be appreciated.  She is in a regular rhythm.  ABDOMEN:  Soft, positive bowel sounds, without  masses.  EXTREMITIES:  Show somewhat diminished pulses with no edema.  NEUROLOGIC:  She is intact with no gross focal deficit.  LABORATORY DATA:  A 12-lead electrocardiogram showing sinus rhythm.  There are inverted T waves in lead III, which is unchanged.  There are no acute changes.  Other labs are pending.  OVERALL IMPRESSION: 1. Recurrent exertional chest discomfort. 2. Known atherosclerotic cardiovascular disease with known two-vessel coronary    disease and history of angioplasty to the right coronary. 3. Hypercholesterolemia. 4. Substance abuse. 5. Cutaneous lupus.  PLAN:  The patient is placed back on Pravachol 20 mg a day.  She is given a prescription for sublingual nitroglycerin to use.  She is reminded to stay on her aspirin therapy and once again, is advised to stop smoking.  Will proceed on with coronary angiography on Friday, May 30, 2001.  The procedure is reviewed in detail and she is willing to proceed.Dictated by:   Jennet Maduro Earl Gala, R.N., A.N.P.-C. Attending Physician:  Hanley Seamen DD:  05/28/01 TD:  05/28/01 Job: [id number]

## 2010-12-01 NOTE — Discharge Summary (Signed)
[place]. [facility]  Patient:    [name], [name] Visit Number: [id number] MRN: [id number]          Service Type: CAT Location: 3700 [phone] Attending Physician:  Eleanora Neighbor Dictated by:   Jennet Maduro Earl Gala, R.N., A.N.P. Admit Date:  05/30/2001 Discharge Date: 05/31/2001   CC:         Demetria Pore. Coral Spikes, M.D.                           Discharge Summary  PRIMARY DISCHARGE DIAGNOSES: 1. Recurrent angina with subsequent coronary angiography showing normal left    main, essentially normal left anterior descending, normal large    intermediate, 50% narrowing in the early portion of the circumflex with a    small nub of the continuation branch which is totally occluded and fills    from right-to-left collaterals, and a 90% focal stenosis in the right    coronary artery just before the crux with subsequent stent placement. 2. Ongoing tobacco abuse as well as substance abuse. 3. Hypercholesterolemia, currently restarted on Pravachol. 4. Cutaneous lupus.  HISTORY OF PRESENT ILLNESS:  [name] is a 55 year old black female who has known coronary disease.  She had previous angioplasty to the right coronary in February of 2001. She presented to our office on May 27, 2001, with complaints of recurrent chest pain.  She was subsequently set up for elective coronary angiography.  Please see the dictated history and physical for further patient presentation and profile.  LABORATORY DATA ON ADMISSION:  Hematocrit was 32.8. Chemistries were satisfactory.  HOSPITAL COURSE:  The patient was admitted on May 30, 2001, to undergo elective coronary angiography. The procedure was tolerated well without any known complications.  The results are as stated above.  An expressed 12 mm stent which was 2.5 mm in diameter was placed across the lesion. There was TIMI-3 flow throughout the procedure and the overall satisfactory result was felt to be excellent  with no residual narrowing. She received TB3A inhibitors post procedure and was transferred to the [facility] for further monitoring and evaluation.  The following day, on May 31, 2001, she was seen by Peter M. [place], M.D., and was deemed stable for discharge.  Her post procedural cardiac enzyme was negative and she was felt to be a stable candidate for discharge.  DISCHARGE CONDITION:  Stable.  DISCHARGE MEDICATIONS:  She would resume all of her home medications which included the Pletal as before, aspirin daily, Plavix 75 mg for 21 days, and Pravachol 20 mg a day.  New prescription for nitroglycerin was also provided.  DISCHARGE INSTRUCTIONS:  She is to call our office for any problems and will ask her to follow up with the nurse practitioner in approximately 10 days for reevaluation.  She was strongly encouraged not to smoke cigarettes or other type products. She is to call for any problems. Dictated by:   Jennet Maduro Earl Gala, R.N., A.N.P. Attending Physician:  Eleanora Neighbor DD:  06/03/01 TD:  06/03/01 Job: [id number]

## 2010-12-01 NOTE — H&P (Signed)
[place]. [facility]  Patient:    [name], [name]                     MRN: [id number] Adm. Date:  [id number] Disc. Date: [id number] Attending:  Wende Mott                         History and Physical  CHIEF COMPLAINT: Pain and weakness of the right knee.  HISTORY OF PRESENT ILLNESS: This patient is a 55 year old black female who has had a history of having had ACL reconstruction to her right knee approximately two years ago.  The patient more recently had noted progressive weakness in the right knee at work as well as at home, with slippage of her kneecap and of the knee itself.  The patient developed pain about the patella and knee swelling.  The patient was seen in the office and found to have loosening of the tibial ACL screw, and significant instability of her knee.  PAST MEDICAL/SURGICAL HISTORY:  1. Hysterectomy.  2. Right knee surgery.  3. Hernia repair.  4. Lupus.  ALLERGIES: No known drug allergies.  CURRENT MEDICATIONS:  1. Premarin.  2. Celebrex.  3. Plaquenil.  4. Aspirin.  SOCIAL HISTORY: The patient occasionally uses alcohol, once a week, and smokes half-a-pack of cigarettes.  FAMILY HISTORY: Noncontributory.  REVIEW OF SYSTEMS: No cardiac or respiratory symptoms.  No urinary or bowel symptoms.  PHYSICAL EXAMINATION:  VITAL SIGNS: Temperature 98.2 degrees, pulse 80, respirations 16, blood pressure 110/60.  Height 4 feet 11 inches.  Weight 102 pounds.  HEENT: Head normocephalic, atraumatic.  Conjunctivae and sclerae clear.  NECK: Supple.  CHEST: Clear.  CARDIAC: S1 and S2, regular.  EXTREMITIES: Old surgical scar of right knee noted, tender both medially and laterally and over a palpable screw at the anterior tibial area.  Range of motion is good.  Positive drawer test.  Positive Lachman test with +3 shift. Tender over entire patella, with weakness of quadriceps muscle, with muscle atrophy and patellofemoral  crepitus, with tenderness to palpation.  LABORATORY DATA: X-rays obtained in the office revealed loosening of the tibial screw and loss of joint space.  IMPRESSION:  1. Loose hardware of right knee.  2. Internal derangement of right knee.  3. Unstable right knee. DD:  07/17/00 TD:  07/18/00 Job: [id number]

## 2010-12-01 NOTE — Cardiovascular Report (Signed)
[place]. [facility]  Patient:    [name], [name] Visit Number: [id number] MRN: [id number]          Service Type: CAT Location: 3700 [phone] Attending Physician:  Eleanora Neighbor Dictated by:   Colleen Can. Deborah Chalk, M.D. Proc. Date: 05/30/01 Admit Date:  05/30/2001   CC:         Demetria Pore. Coral Spikes, M.D.   Cardiac Catheterization  HISTORY:  Ms. Guerin is a 55 year old female with a history of lupus and previous angioplasty of the right coronary artery in February 2001.  She presents with recurrent angina.  She is known to have a total occlusion of the left circumflex.  PROCEDURE:  Left heart catheterization with selective coronary angiography and left ventricular angiography with stent placement in the right coronary artery.  CARDIOLOGIST:  Colleen Can. Deborah Chalk, M.D.  TYPE AND SITE OF ENTRY:  Percutaneous right femoral artery.  CATHETERS:  6-French 4 curved Judkins right and left coronary catheters, 6-French pigtail ventricular guiding catheter.  CONTRAST MATERIAL:  Omnipaque.  COMMENTS:  The patient tolerated the procedure well.  MEDICATIONS GIVEN PRIOR TO THE PROCEDURE:  Valium 10 mg p.o.  MEDICATIONS GIVEN DURING PROCEDURE:  Versed, heparin, Integrilin, and IV nitroglycerin.  HEMODYNAMIC DATA:  The aortic pressure was 96/70.  LV was 96/6-16.  There was no aortic valve gradient noted on pullback.  ANGIOGRAPHIC DATA:  1. Left main coronary artery is normal. 2. Left anterior descending artery:  The left anterior descending artery is a    reasonably large vessel that extends around the apex.  It is essentially    normal. 3. Intermediate Coronary: There is a large intermediate coronary which is    normal. 4. Left circumflex: The left circumflex has a 50% narrowing in the early    portion of the vessel.  There is a small nub of the continuation branch    which is totally occluded and fills from right-to-left collaterals. 5. Right coronary  artery: The right coronary artery is a dominant vessel that    supplies extensive collaterals to the continuation branch of the left    circumflex.  There is a 90% focal stenosis in the right coronary artery    before the crux.  LEFT VENTRICULAR ANGIOGRAM:  The left ventricular angiogram was performed in the RAO position.  Overall cardiac size and silhouette were normal.  There was a moderate and discrete inferior hypokinesis.  Global ejection fraction was 60%.  There was no mitral regurgitation, intracoronary calcification, or intracavitary filling defect.  ANGIOPLASTY PROCEDURE:  We then turned our attention to the right coronary artery. A JR4 guide with side holes and high-torque floppy guidewire were used.  We placed an Express 12 mm stent, 2.5 mm in diameter, across the lesion.  It was inflated to a maximum of 18 atmospheres before there was final expansion of the balloon.  This produced an excellent result with no residual gradient.  There remained TIMI-3 flow throughout the procedure.  OVERALL IMPRESSION: 1. Severe stenosis in the right coronary artery before the crux with    successful stent placement. 2. Totally occluded distal left circumflex with right-to-left collaterals. 3. Discrete inferior hypokinesis with overall well preserved global ejection    fraction. 4. Minimal coronary disease in the left anterior descending system. Dictated by:   Colleen Can Deborah Chalk, M.D. Attending Physician:  Eleanora Neighbor DD:  05/30/01 TD:  05/30/01 Job: [id number]

## 2010-12-01 NOTE — H&P (Signed)
NAMEGWYNN, CHALKER NO.:  [id number]   MEDICAL RECORD NO.:  [id number]          PATIENT TYPE:  EMS   LOCATION:  MAJO                         FACILITY:  [facility]   PHYSICIAN:  Colleen Can. Deborah Chalk, M.D.DATE OF BIRTH:  05/04/1956   DATE OF ADMISSION:  06/14/2006  DATE OF DISCHARGE:  06/14/2006                              HISTORY & PHYSICAL   CHIEF COMPLAINT:  Chest pain.   HISTORY OF PRESENT ILLNESS:  [name] is a very pleasant 55 year old black  female who has known ischemic heart disease.  She has ongoing substance  abuse as well as hyperlipidemia.  She has been taken off of her  cholesterol medicine due to possible adverse reaction since Christmas.  She presents to the office with complaints of chest pain off and on over  the course of the past three weeks.  She was seen on December 27th after  having a crushing-like sensation three days previous.  This required  nitroglycerin.  She has been very tired and easily fatigued since that  time.  Continues to have a pressure-like sensation over the left breast  that is exertional in nature.  She was seen in the office today on  July 30, 2006, and basically has had chest pain all day.  She was  treated with nitroglycerin and subsequently referred for admission for  plans for repeat catheterization.   PAST MEDICAL HISTORY:  1. Known atherosclerotic cardiovascular disease.  She had angioplasty      to the right coronary in February 2001.  She had known residual      disease at that time.  She subsequently had stent placement to the      right coronary in November 2002 and that was the time of her last      catheterization.  Other findings at that time showed severe      stenosis in the right coronary before the crux with successful      stent placement with a 12.0-mm x 2.5-mm express stent.  She has a      known totally occluded distal left circumflex of the right and left      collaterals and well-preserved LV  function.  2. Ongoing tobacco use.  3. Cutaneous lupus.  4. Hyperlipidemia.  SHE IS INTOLERANT TO ZOCOR.   ALLERGIES:  None.   CURRENT MEDICINES:  Aspirin  Plaquenil 200 mg a day.  Nitroglycerin p.r.n.   SURGICAL HISTORY:  1. Hemorrhoidectomy.  2. Partial hysterectomy with subsequent total hysterectomy in April      2000.  3. Previous knee surgery x2.  4. Previous inguinal hernia repair.  5. History of a benign left breast lump.   FAMILY HISTORY:  Father died at 74 with heart disease, hypertension,  stroke, and actually died of heart failure.  Mother died at 73 with lung  cancer and had a history of hypertension.   SOCIAL HISTORY:  She is single.  She does smoke 1/2 pack of cigarettes a  day and also uses marijuana frequently.  She does consume alcohol.  She  denies any cocaine use.   REVIEW OF  SYSTEMS:  As noted above.  Otherwise, unremarkable.   ON EXAM:  GENERAL:  She is a thin black female in no acute distress.  She is complaining of chest pressure during the visit.  VITAL SIGNS:  Weight 116.  Blood pressure 119/80 sitting, 119/78  standing, subsequently up to 150/90.  Heart rate 72.  Respirations 18.  Afebrile.  SKIN:  Warm and dry.  Color is unremarkable.  LUNGS:  Somewhat coarse.  CARDIAC EXAM:  Shows a regular rhythm.  ABDOMEN:  Soft.  Positive bowel sounds.  Nontender.  EXTREMITIES:  Without edema.  NEUROLOGIC:  Shows no gross focal deficits.   PERTINENT LABS:  Her EKG shows a left atrial abnormality with T-waves  inferiorly consistent with ischemia.  Her other labs are pending.   OVERALL IMPRESSION:  1. Unstable angina.  2. Known ischemic heart disease.  3. Substance abuse.  4. Hyperlipidemia.   PLAN:  She will be admitted to the hospital.  She will be placed on IV  nitroglycerin and IV Heparin.  We will restart Lipitor and continue with  Aspirin.  We will make plans for her to undergo cardiac catheterization  on the afternoon of 07/31/06.      [name], N.P.      Colleen Can. Deborah Chalk, M.D.  Electronically Signed    LC/MEDQ  D:  07/30/2006  T:  07/30/2006  Job:  [id number]   cc:   Demetria Pore. Coral Spikes, M.D.

## 2010-12-01 NOTE — Discharge Summary (Signed)
[place]. [facility]  Patient:    [name], [name]                     MRN: [id number] Adm. Date:  [id number] Disc. Date: [id number] Attending:  Wende Mott                           Discharge Summary  ADMITTING DIAGNOSES:  1. Internal derangement of right knee.  2. Loose anterior cruciate ligament screw of right knee.  DISCHARGE DIAGNOSES:  1. Disrupted reconstructed anterior cruciate ligament of right knee.  2. Loose anterior cruciate ligament screw of right knee.  3. Synovitis of right knee.  HISTORY OF PRESENT ILLNESS: This patient is a 55 year old with a history of a reconstructed ACL approximately two years ago.  The patient has had progressive worsening of symptoms over the past few weeks with instability, pain, and swelling anteriorly, and progressive weakening of the right knee.  PHYSICAL EXAMINATION:  Examination of the right knee revealed a palpable screw anteriorly with tenderness anteriorly, medially, and laterally, with patellofemoral crepitus with subluxation of the patella, quadriceps atrophy, and instability of the right knee.  HOSPITAL COURSE: The patient underwent removal of the loose ACL screw and arthroscopic synovectomy of the right knee.  The patient tolerated the procedure quite well and was able to be discharged home with the use of a cane.  DISCHARGE MEDICATIONS: Vicodin 1 q.4h p.r.n. pain.  DISCHARGE INSTRUCTIONS: The patient was instructed in use of ice packs and elevation.  FOLLOW-UP: The patient is to return to the office in one week.  DISCHARGE CONDITION: The patient was discharged in stable and satisfactory condition. DD:  07/17/00 TD:  07/18/00 Job: [id number]

## 2010-12-01 NOTE — Discharge Summary (Signed)
[place]. [facility]  Patient:    [name], [name]                     MRN: [id number] Adm. Date:  [id number] Disc. Date: [id number] Attending:  Eleanora Neighbor Dictator:   Jennet Maduro. Earl Gala, R.N., A.N.P. CC:         Colleen Can. Deborah Chalk, M.D.             [facility]             Demetria Pore. Coral Spikes, M.D.                           Discharge Summary  DISCHARGE DIAGNOSES: 1.  Chest pain, with a previous negative 2-D echocardiogram and negative adenosine     Cardiolite, yet with recurrent exertional chest pain and shortness of breath,     with subsequent cardiac catheterization and subsequent angioplasty of the     right coronary artery. 2.  Residual coronary disease with 50 per cent left circumflex proximally, with 100     per cent of the A-V groove branch that fills by atrial branches from the right     coronary. 3.  Cutaneous lupus. 4.  Hypercholesterolemia, currently to be initiated on Statin therapy. 5.  Ongoing tobacco abuse.  HISTORY OF PRESENT ILLNESS:  [name] is a 55 year old black female who presents for cardiac catheterization due to complaints of exertional chest pressure and shortness of breath that has been occurring for the past two weeks.  She had a previous negative adenosine Cardiolite and negative echocardiogram back in August 2000.  It was felt that it was in her best interest to proceed on with cardiac catheterization to further define the etiology of her symptoms.  Please see the dictated History and Physical for further patient presentation and profile.  ADMISSION LABORATORY DATA:  EKG showed normal sinus rhythm.  Cholesterol was 237, HDL 71, LDL 149, triglycerides 86.  PT and PTT were unremarkable.  Chemistries revealed a sodium of 138, potassium 4.4, chloride 99, CO2 32, BUN 12, creatinine 0.7, glucose 127.  CBC showed WBC of 9.2, hemoglobin 13, hematocrit 40, platelets 395,000.  HOSPITAL COURSE:  Ms.  [name] was admitted from the short-stay center in order o undergo cardiac catheterization.  This procedure was performed without any known complications.  Left and right heart catheterizations were performed.  LV demonstrated mild inferobasal hypokinesis with ejection fraction 50 to 60 per cent, left main coronary normal, left circumflex with a 50 per cent proximal narrowing, 100 per cent narrowing of the A-V groove branch that fills by atrial branches from the right coronary.  LAD is normal.  There are left-to-right collaterals to the  distal right coronary.  Angioplasty was subsequently performed to the right coronary artery with a 1.5 - balloon and subsequent 2.0 - balloon to a maximum f 16 atmospheres with a less than 30 per cent residual stenoses.  There were attempts to cross the total occlusion of the left circumflex but this was unsuccessful. It was very tortuous with poor backup.  At this time the procedure was stopped and she was subsequently transferred to 6500 for further monitoring and evaluation. She did receive IV ReoPro post infusion and today, August 31, 1999, she is doing well.  She has no complaints, she has been up and ambulatory without chest pain, the groin has remained stable, hematocrit has dropped slightly at 31.7  and this is to be followed up as an outpatient.  CK-MB is negative, and she is felt to be stable for discharge today.  DISCHARGE CONDITION:  Improved.  DISCHARGE MEDICATIONS: 1.  She will finish her prednisone as previously prescribed. 2.  Plaquenil 200 mg q.d. 3.  Premarin 0.625 mg q.d. 4.  Aspirin 1 q.d. 5.  Zocor 40 mg q.d. 6.  Imdur 30 mg q.d. 7.  Also given prescription for nitroglycerin p.r.n.  ACTIVITY:  To be light for the next few days.  She is not to return to work until September 04, 1999 and will do light duty x one week and then may return with no  restrictions.  DIET:  Low-fat/low-cholesterol.  WOUND CARE:  She is  to place an ice pack to the groin and instructed to call for any problems.  DISPOSITION:  We will otherwise see her back in our office in approximately ten  days, and she is asked to call to set that appointment up. DD:  08/31/99 TD:  08/31/99 Job: [id number]

## 2010-12-01 NOTE — Discharge Summary (Signed)
[name], [name] NO.:  [id number]   MEDICAL RECORD NO.:  [id number]          PATIENT TYPE:  INP   LOCATION:  2040                         FACILITY:  [facility]   PHYSICIAN:  [name], M.D.DATE OF BIRTH:  July 31, 1955   DATE OF ADMISSION:  07/30/2006  DATE OF DISCHARGE:  07/31/2006                               DISCHARGE SUMMARY   PRIMARY DISCHARGE DIAGNOSIS:  1. Chest pain, consistent with unstable angina with subsequent cardiac      catheterization showing a totally occluded continuation branch of      the left circumflex in the atrioventricular groove with right-to-      left collaterals, 50% to 60% narrowing in the proximal portions of      the left circumflex, with 28% to 30% narrowing in the right      coronary.  The patient will continue with medical management.  2. Ongoing substance abuse.  3. Hyperlipidemia with reinitiation of statin therapy.  4. Cutaneous lupus.   HISTORY OF PRESENT ILLNESS:  The patient is a 55 year old, black female  who has nonischemic heart disease.  She has ongoing substance abuse as  well as hyperlipidemia.  She was taken off of Vytorin at Christmas due  to possible adverse reaction when she was complaining of significant  fatigue and sleepiness.  She has had 3 weeks of chest pain off and on  that is exertional in nature.  When she was seen in the office as a work-  in appointment,  she had had chest pain all day that was responsive to  nitroglycerin.  She was, subsequently, admitted to the hospital from the  office for further evaluation.   Please see the history and physical dictated on July 30, 2006, for  further patient presentation and profile.   LABORATORY DATA:  On admission, her EKG showed a left atrial abnormality  with T-wave inversion inferiorly that was consistent with ischemia.   Her cardiac enzymes were negative. Drug screen was positive only for  marijuana.  Urinalysis was unremarkable.  CBC was  normal.  TSH was  normal. Chemistries were all normal as well.   Chest x-ray showed cardiomegaly and premature vascular calcification  with no acute findings.   HOSPITAL COURSE:  The patient was admitted electively.  She was placed  on IV nitroglycerin, IV heparin.  She was subsequently pain-free.  She  ruled out negative for myocardial infarction per serial enzymes.  We  proceeded on with cardiac catheterization the following afternoon.  Those results are as noted above.  The patient is felt to need to  continue with cardiovascular risk factor modification.  After her bed  rest with complete and the groin pain remained unremarkable, she was  discharged that evening.   DISCHARGE CONDITION:  Stable.   DISCHARGE MEDICINES:  She will resume all of her previous medicines that  she was taking before.  This includes:  1. Aspirin.  2. Plaquenil 200 mg daily.  3. Will reinitiate Zocor 40 mg daily.  4. She is also placed on Toprol XL 25 mg daily.   We will plan on  seeing her back in the office on Monday morning at 9  a.m., certainly sooner if any problems would arise in the interim, and  she is to call if any problems arise.      [name], N.P.      [name], M.D.  Electronically Signed    LC/MEDQ  D:  08/02/2006  T:  08/02/2006  Job:  [id number]   cc:   [name]. [name], M.D.  [name], [place].P.

## 2010-12-01 NOTE — Cardiovascular Report (Signed)
[place]. [facility]  Patient:    [name], [name]                     MRN: [id number] Proc. Date: 08/30/99 Adm. Date:  [id number] Attending:  Eleanora Neighbor CC:         Demetria Pore. Coral Spikes, M.D.                        Cardiac Catheterization  INDICATIONS:  [name] is a 55 year old female with a history of discoid lupus. She had negative adenosine Cardiolite and echocardiogram in August 2000.  She was referred for evaluation of recurrent clear-cut exertional chest discomfort and dyspnea.  She is a cigarette smoker and has cutaneous lupus.  PROCEDURES PERFORMED:  Left heart catheterization with selective coronary angiography, left ventricular angiography with angioplasty of the right coronary artery with right heart catheterization.  TYPE AND SITE OF ENTRY:  Percutaneous, right femoral artery; percutaneous, right femoral vein.  CATHETERS:  Six French 3.5 curved Judkins left and right coronary catheters, 6-French pigtail ventriculography catheter, 7-French Swan-Ganz thermodilution catheter, 6-French JR-3.5 with sideholes guide catheter, a 1.5 x 20 mm Adante and a 2.0 x 20 mm Adante with a high-torque floppy guide wire and subsequent attempt o angioplasty the left circumflex with a 3.5 curved Judkins 6-French guide and then attempt with a 6-French Voda guide (unsuccessful on the last).  CONTRAST MATERIAL:  Isovue.  MEDICATIONS GIVEN DURING PROCEDURE:  ReoPro, heparin 2700 units IV.  COMMENTS:  The patient tolerated the procedure well.  She did receive 2 mg of Versed and had nice sedation.  RESULTS:  HEMODYNAMIC DATA: 1. The right atrial pressure had an A-wave, V-wave of 3, and mean of 3. 2. RV pressure 24/5. 3. PA pressure 26/10, with a mean of 16. 4. Pulmonary capillary wedge pressure had A-wave of 7, V-wave of 9, mean of 7. 5. Aortic pressure 122/83. 6. LV pressure 140/14. 7. Pulmonary artery saturation 65. 8. Aortic saturation  95.  Fick cardiac output was 2.61, with cardiac index of 1.74. Thermodilution cardiac output was 3.0, with cardiac index of 2.0.  ANGIOGRAPHIC DATA: 1. The left main coronary artery was normal.  2. The left anterior descending artery was essentially normal.  It had a large    proximal diagonal vessel.  3. The left circumflex had tortuosity in its early mid portion.  There was a small    nub present in the vessel in the AV groove and this was filled by collaterals    from the right coronary artery.  4. The right coronary artery was totally occluded before the crux.  It was a    relatively small vessel.  There were extensive left-to-right collaterals from    the left anterior descending artery.  There are collaterals from the right    coronary artery going to the continuation branch of the left circumflex.  LEFT VENTRICULAR ANGIOGRAM:  The left ventricular angiogram was performed in the RAO position.  Overall cardiac size and silhouette were normal.  There was a discrete area of inferior hypokinesis.  The global ejection fraction would be approximately 55%.  ANGIOPLASTY PROCEDURE:  We changed for a 6-French guide with sideholes and used a high-torque floppy guide wire.  We were able to cross the total occlusion.  We initially dilated with a 1.5 mm CrossSail.  The balloon was satisfactorily inflated, but after inflation and with flow there was an enlargement of the  vessel. We returned with the 2.0 mm and inflated it to a maximum of 16 atm, which gave s a 2.2 mm diameter balloon.  There was a mild 30% residual stenosis as we finished and we elected not to proceed on with higher balloon pressures or a larger balloon.   We then turned our attention to the left coronary artery.  With repeat infusion, we had reversed the collateral flow to the distal right coronary artery.  The left  circumflex had a short segment of disease in a tortuous segment and out of that  segment there  was clearly the nub of the occluded vessel to the AV groove.  We attempted to cross this with a guide wire and had problems with tortuosity and backup.  There was also a moderate stenosis before and after the origin of this nub and it was somewhat concerning to be aggressively manipulating within this vessel with these two moderate stenoses before and after the totally occluded side-branch. We attempted to cross with a high-torque floppy and then a high-torque intermediate and returned and tried to use a Voda guide, but we were unable to cross.  We decided to abort the procedure at that point in time.  OVERALL IMPRESSION: 1. Mild inferior basilar hypokinesis. 2. Two-vessel coronary artery disease; totally occluded distal right coronary    artery with successful angioplasty. 3. Totally occluded distal left circumflex with collaterals from the right coronary    artery via way of atrial branches, but with unsuccessful attempt at    recanalization and dilatation of that vessel. 4. Normal right heart pressures. 5. Relatively low cardiac output. DD:  08/30/99 TD:  08/30/99 Job: [id number]

## 2010-12-01 NOTE — Op Note (Signed)
[place]. [facility]  Patient:    [name], [name]                     MRN: [id number] Proc. Date: 07/02/00 Adm. Date:  [id number] Disc. Date: [id number] Attending:  Wende Mott                           Operative Report  PREOPERATIVE DIAGNOSES:  1. Nonfunctional anterior cruciate ligament with disruption.  2. Chronic thickening of synovium.  3. Loose anterior cruciate ligament screw.  POSTOPERATIVE DIAGNOSES:  1. Nonfunctional anterior cruciate ligament with disruption.  2. Chronic thickening of synovium.  3. Loose anterior cruciate ligament screw.  OPERATION/PROCEDURE:  1. Arthroscopic synovectomy with resection of loose fragments of     reconstructed anterior cruciate ligament.  2. Removal of anterior cruciate ligament screw from the tibia.  SURGEON: Kennieth Rad, M.D.  ANESTHESIA: General.  DESCRIPTION OF PROCEDURE: The patient was taken to the operating room after being given adequate preoperative medication and given general anesthesia and intubated.  The right knee was prepped with DuraPrep and draped in a sterile manner.  A tourniquet was used for hemostasis.  Initially a midline incision was made over the previous surgical scar, over the palpable ACL screw.  The screw was identified and removed.  Copious irrigation was then done and wound closure done.  Next, a puncture wound was made along the medial and lateral joint line and entrance was through the medial suprapatellar pouch area. Inspection of the joint revealed fibers of the ACL, old reconstructed ACL disruption, and obvious demonstration of complete attenuation of the ACL with shifting of the tibia underneath the femur.  Some chondromalacia changes of the patella were noted with thickened synovium noted of both medial and lateral compartments as well as suprapatellar pouch area.  Complete synovectomy was done and removal of the disrupted fibers of the ACL.  The lateral  femoral condyle was fairly well preserved as well as the lateral meniscus.  The medial compartment had some eburnation with some articular surface breakdown.  Copious irrigation was then done.  Wound closure was then done with 4-0 Nylon and a compressive dressing applied, and a knee immobilizer applied.  The patient tolerated the procedure quite well and was taken to the recovery room in stable and satisfactory condition. DD:  07/17/00 TD:  07/18/00 Job: [id number]

## 2010-12-01 NOTE — Op Note (Signed)
[place]. [facility]  Patient:    [name], [name]                     MRN: [id number] Proc. Date: 08/01/00 Adm. Date:  [id number] Disc. Date: [id number] Attending:  Wende Mott                           Operative Report  PREOPERATIVE DIAGNOSIS:  Reconstructed ACL tear and ACL screw right femur.  POSTOPERATIVE DIAGNOSIS:  Reconstructed ACL tear and ACL screw right femur.  ANESTHESIA:  General.  PROCEDURE:  Reconstruction right ACL with cadaver bone graft, bone-tendon-bone, and removal of old ACL screw right femur.  DESCRIPTION:  Patient taken to the operating room and after given adequate preoperative medication given general anesthesia in her bed.  The right lower leg was prepped with DuraPrep and draped in sterile manner.  A tourniquet was used for hemostasis.  A one-half inch puncture wound was made along the anteromedial and lateral joint line.  ______ was to the medial suprapatellar pouch area.  Inspection of the joint revealed the disrupted ACL reconstructed ligament.  This was completely resected with the use of arthroscopic shaver. Next, an ACL tibial guide was put in place and a small incision was made over the tibia.  With the knee in the flexed position a guidewire was placed through the guide up into the femur under direct vision with use of the arthroscope.  After adequate reaming through the tibia, copious irrigation was done and with the use of the shaver the entrance to the joint itself was debrided with the use of arthroscopic shaver and then smoothed with a rasp. This was done with the use of a 9 mm reamer.  Next, guidewire was placed up into the femur.  The previous ACL screw was identified and with the use of a screwdriver this was removed.  The guidewire was placed at a different site into the femur for the femoral attachment of the graft.  Next, reaming was done over the guidewire into the femur and this was done down to 20  mm into the femoral condyle.  Copious irrigation was done to remove any loose fragments.  Next, the graft was then worked on and thinned down with bone-tendon-bone graft, sizing it to 9 mm.  Drill holes were placed into both ends of the bone ends and sutures tied to the graft area.  Guidewire was placed through the femoral drill hole and penetrated through the quadriceps and incision made into the skin, allowing [name] to retrieve the guidewire. Suture was tied to the guidewire and placed through the tibia into the femur and pulled in place.  Excess bone was removed from the tibial end of the graft and a 9 mm x 20 mm absorbable screw was placed into the femur and into the tibia.  Anterior drawer and Lachmans test were done and the knee was found to be good and stable with good flexion and extension and without any impingement on the graft.  Further inspection was done for any other loose fragments and copious irrigation was done.  Wound closure was then done with 2-0 Vicryl and skin staples, compressive dressing was applied, the knee immobilizer applied. Patient tolerated the procedure quite well and went to the recovery room in stable and satisfactory condition. DD:  09/03/00 TD:  09/03/00 Job: [id number]

## 2010-12-01 NOTE — Discharge Summary (Signed)
[place]. [facility]  Patient:    [name], [name]                     MRN: [id number] Adm. Date:  [id number] Disc. Date: [id number] Attending:  Wende Mott                           Discharge Summary  ADMITTING DIAGNOSIS:  Torn ACL reconstruction and ACL screw, right femur.  DISCHARGE DIAGNOSIS:  Torn ACL reconstruction and ACL screw, right femur.  COMPLICATIONS:  None.  CONSULTATIONS:  None.  OPERATION:  Reconstruction right ACL with cadaver bone-tendon-bone graft and removal of ACL screw.  PERTINENT HISTORY:  This is a 55 year old who previously had arthroscopic surgery done for the removal of loose ACL screw and found to have disrupted reconstructed ACL tendon and incompetent tendon substance.  Patient returns presently for a reconstruction of the ACL tendon with complaint of instability, pain, and discomfort.  PERTINENT PHYSICAL DATA:  RIGHT KNEE:  Range of motion was good.  Positive Lachmans, positive pivot-shift and anterior drawer test.  Mild effusion.  Previous arthroscopic wounds have healed quite well.  HOSPITAL COURSE:  Patient underwent reconstruction with use of bone-tendon-bone graft.  Postoperative course was very benign.  Patient placed on IV pain medication, IV antibiotics, physical therapy, ambulation, placed in a postoperative ACL brace with 30 degrees of flexion and full extension. Patient did quite well postoperatively and was able to be discharged in stable and satisfactory condition.  DISCHARGE MEDICATIONS: 1. Cipro 250 daily. 2. Celebrex 200 daily. 3. Ambien 10 mg h.s.  SPECIAL INSTRUCTIONS:  Ice packs, elevation, use of crutches, partial weightbearing on the right side, 30 degree functional knee movement in the right knee with postoperative knee brace.  FOLLOW-UP:  Return to the office in one week.  DISPOSITION:  Patient discharged in stable and satisfactory condition. DD:  09/03/00 TD:  09/03/00 Job:  [id number]

## 2010-12-01 NOTE — H&P (Signed)
[place]. [facility]  Patient:    [name], [name]                     MRN: [id number] Adm. Date:  [id number] Disc. Date: [id number] Attending:  Wende Mott                         History and Physical  CHIEF COMPLAINT:  Painful swollen right knee with ACL tear.  HISTORY OF PRESENT ILLNESS:  This is a 55 year old female who recently had arthroscopic surgery to her right knee because of instability, swelling, and pain in the right knee.  Patient was found to have loose tibial ACL screw; also, a dysfunctional disruption of her ACL reconstruction.  Patient presently is to have ACL reconstructed with use of cadaver graft and removal of the femoral screw.  PAST MEDICAL HISTORY:  Hysterectomy in 1999.  Arthroscopic right knee surgery followed by ACL right knee reconstruction followed by arthroscopy with removal of tibial ACL screw.  Angioplasty in 2000.  Hernia repair.  ALLERGIES:  None known.  MEDICATIONS:  Premarin, ______, and Celebrex.  HABITS:  Occasional use of alcohol.  Smokes one-half pack a day.  FAMILY HISTORY:  Noncontributory.  REVIEW OF SYSTEMS:  Recurrent cough.  No urinary or bowel symptoms.  No cardiac symptoms.  PHYSICAL EXAMINATION:  VITAL SIGNS:  Temperature 97.7, pulse 62, respirations 12, blood pressure 90/60.  Height 4 feet 11 inches, weight 102.  HEENT:  Normocephalic.  Eyes show conjunctivae and sclerae clear.  LUNGS:  Some rhonchi at the bases.  ABDOMEN:  Soft, active bowel sounds.  EXTREMITIES:  Right knee:  Old surgical scars.  Positive anterior drawer test, positive Lachmans test.  There is audible and palpable click.  Neurovascular status is intact.  IMPRESSION:  Tear, right ACL reconstruction and ACL screw in the femur. DD:  09/03/00 TD:  09/03/00 Job: [id number]

## 2010-12-04 ENCOUNTER — Other Ambulatory Visit (INDEPENDENT_AMBULATORY_CARE_PROVIDER_SITE_OTHER): Payer: Federal, State, Local not specified - PPO | Admitting: *Deleted

## 2010-12-04 ENCOUNTER — Encounter: Payer: Self-pay | Admitting: Nurse Practitioner

## 2010-12-04 ENCOUNTER — Ambulatory Visit (INDEPENDENT_AMBULATORY_CARE_PROVIDER_SITE_OTHER): Payer: Federal, State, Local not specified - PPO | Admitting: Nurse Practitioner

## 2010-12-04 VITALS — BP 122/90 | HR 64 | Ht 59.0 in | Wt 111.4 lb

## 2010-12-04 DIAGNOSIS — T148XXA Other injury of unspecified body region, initial encounter: Secondary | ICD-10-CM | POA: Insufficient documentation

## 2010-12-04 DIAGNOSIS — Z720 Tobacco use: Secondary | ICD-10-CM | POA: Insufficient documentation

## 2010-12-04 DIAGNOSIS — I2510 Atherosclerotic heart disease of native coronary artery without angina pectoris: Secondary | ICD-10-CM

## 2010-12-04 DIAGNOSIS — I259 Chronic ischemic heart disease, unspecified: Secondary | ICD-10-CM

## 2010-12-04 DIAGNOSIS — E785 Hyperlipidemia, unspecified: Secondary | ICD-10-CM

## 2010-12-04 DIAGNOSIS — Z79899 Other long term (current) drug therapy: Secondary | ICD-10-CM

## 2010-12-04 DIAGNOSIS — I10 Essential (primary) hypertension: Secondary | ICD-10-CM | POA: Insufficient documentation

## 2010-12-04 DIAGNOSIS — E7800 Pure hypercholesterolemia, unspecified: Secondary | ICD-10-CM

## 2010-12-04 DIAGNOSIS — L932 Other local lupus erythematosus: Secondary | ICD-10-CM | POA: Insufficient documentation

## 2010-12-04 DIAGNOSIS — F419 Anxiety disorder, unspecified: Secondary | ICD-10-CM | POA: Insufficient documentation

## 2010-12-04 LAB — LIPID PANEL
Cholesterol: 123 mg/dL (ref 0–200)
HDL: 49.8 mg/dL (ref >39.00)
VLDL: 9 mg/dL (ref 0.0–40.0)

## 2010-12-04 LAB — BASIC METABOLIC PANEL
BUN: 14 mg/dL (ref 6–23)
CO2: 28 mEq/L (ref 19–32)
Calcium: 9.2 mg/dL (ref 8.4–10.5)
GFR: 104.15 mL/min (ref >60.00)
Glucose, Bld: 77 mg/dL (ref 70–99)

## 2010-12-04 LAB — HEPATIC FUNCTION PANEL
AST: 26 U/L (ref 0–37)
Albumin: 3.8 g/dL (ref 3.5–5.2)
Total Protein: 6 g/dL (ref 6.0–8.3)

## 2010-12-04 MED ORDER — METOPROLOL SUCCINATE ER 25 MG PO TB24: 25.0000 mg | ORAL_TABLET | Freq: Every day | ORAL | Status: DC

## 2010-12-04 MED ORDER — CLOPIDOGREL BISULFATE 75 MG PO TABS: 75.0000 mg | ORAL_TABLET | ORAL | Status: DC

## 2010-12-04 MED ORDER — EZETIMIBE-SIMVASTATIN 10-40 MG PO TABS: 1.0000 | ORAL_TABLET | Freq: Every day | ORAL | Status: DC

## 2010-12-04 NOTE — Assessment & Plan Note (Signed)
She is encouraged to stop. She does have a prescription for Chantix to try.

## 2010-12-04 NOTE — Assessment & Plan Note (Signed)
No meds taken today. She will continue with his current regimen.

## 2010-12-04 NOTE — Assessment & Plan Note (Signed)
Doing well clinically. We will keep her on her current regimen. We will have her follow up with Dr. Sanjuana Kava for follow up in 6 months since Dr. Deborah Chalk is retiring. Patient is agreeable to this plan and will call if any problems develop in the interim.

## 2010-12-04 NOTE — Patient Instructions (Addendum)
Stay on your current medicines. I have refilled your Toprol, Vytorin and your Plavix. Stop smoking! You will do better in the long run. Consider the Chantix

## 2010-12-04 NOTE — Assessment & Plan Note (Signed)
This is better with taking the Plavix every other day. We will continue this regimen.

## 2010-12-04 NOTE — Assessment & Plan Note (Signed)
We will check her labs today.  

## 2010-12-04 NOTE — Progress Notes (Signed)
    [name] Date of Birth: 04/27/1956   History of Present Illness: [name] is seen today for her 6 month visit. She is seen for Dr. Deborah Chalk. She has known ischemic heart disease. She has ongoing tobacco/marijuana use. She is not having chest pain. She feels good overall. She is trying to lose weight. She does not check her blood pressure at home. She has no complaint.   Current Outpatient Prescriptions on File Prior to Visit  Medication Sig Dispense Refill  . aspirin 81 MG tablet Take 81 mg by mouth daily.        . clopidogrel (PLAVIX) 75 MG tablet Take 75 mg by mouth every other day.       . ezetimibe-simvastatin (VYTORIN) 10-40 MG per tablet Take 1 tablet by mouth at bedtime.        . hydroxychloroquine (PLAQUENIL) 200 MG tablet Take 200 mg by mouth daily. 2 DAILY      . metoprolol succinate (TOPROL-XL) 25 MG 24 hr tablet Take 25 mg by mouth daily.        . Zolpidem Tartrate (AMBIEN PO) Take by mouth as needed.         No Known Allergies  Past Medical History  Diagnosis Date  . Bruising   . IHD (ischemic heart disease)   . Cutaneous lupus erythematosus   . Anxiety   . Depression   . Noncompliance   . PVD (peripheral vascular disease)     MILD  . H/O: substance abuse   . Hyperlipidemia     Past Surgical History  Procedure Date  . Cardiac catheterization 01/04/2009    CARDIAC SIZE NORMAL. EF IS 60%  . Coronary stent placement 12/2008    X2  . Coronary angioplasty 08/1999  . Coronary stent placement 05/2001    History  Smoking status  . Current Everyday Smoker -- 0.5 packs/day  Smokeless tobacco  . Not on file    History  Alcohol Use  . Yes    Family History  Problem Relation Age of Onset  . Hypertension Mother   . Lung cancer Mother   . Heart disease Father   . Hypertension Father   . Stroke Father   . Heart failure Father     Review of Systems: The review of systems is as above.  All other systems were reviewed and are negative.  Physical  Exam: BP 122/90  Pulse 64  Ht 4\' 11"  (1.499 m)  Wt 111 lb 6.4 oz (50.531 kg)  BMI 22.50 kg/m2 She has not had her medicines yet today. Weight is down 4 pounds from her last visit. Patient is very pleasant and in no acute distress. She smells of tobacco. Skin is warm and dry. Color is normal.  HEENT is unremarkable. Normocephalic/atraumatic. PERRL. Sclera are nonicteric. Neck is supple. No masses. No JVD. Lungs are coarse. Cardiac exam shows a regular rate and rhythm. Abdomen is soft. Extremities are without edema. Gait and ROM are intact. No gross neurologic deficits noted.  LABORATORY DATA:  PENDING   Assessment / Plan:

## 2010-12-05 ENCOUNTER — Telehealth: Payer: Self-pay | Admitting: Cardiology

## 2010-12-05 NOTE — Telephone Encounter (Signed)
Pt called regarding lab results. Informed her that they have not been reviewed and we would call her back as soon as they were reviewed.

## 2010-12-07 ENCOUNTER — Telehealth: Payer: Self-pay | Admitting: *Deleted

## 2010-12-07 NOTE — Telephone Encounter (Signed)
L/M for pt to call back regarding lab work 

## 2010-12-07 NOTE — Telephone Encounter (Signed)
Lab results reported to pt

## 2010-12-14 ENCOUNTER — Telehealth: Payer: Self-pay | Admitting: Cardiology

## 2010-12-14 ENCOUNTER — Other Ambulatory Visit (INDEPENDENT_AMBULATORY_CARE_PROVIDER_SITE_OTHER): Payer: Federal, State, Local not specified - PPO | Admitting: *Deleted

## 2010-12-14 DIAGNOSIS — T148XXA Other injury of unspecified body region, initial encounter: Secondary | ICD-10-CM

## 2010-12-14 NOTE — Telephone Encounter (Signed)
Bruises on left thigh- size of small frying pan, on right arm at fatting part.

## 2010-12-14 NOTE — Telephone Encounter (Signed)
Pt called, c/o large bruises on legs and arms.  Pt is concerned and would like to know what to do.  Pt is on Plavix 75 mg every other day, Aspirin 81 mg daily, and Plaquenil 200 mg BID.  Dr. Deborah Chalk notified and instructed RN to have pt come in today for CBC with diff.  Pt notified and will come in today for CBC.

## 2010-12-15 ENCOUNTER — Telehealth: Payer: Self-pay | Admitting: *Deleted

## 2010-12-15 LAB — CBC WITH DIFFERENTIAL/PLATELET
Basophils Absolute: 0 10*3/uL (ref 0.0–0.1)
Eosinophils Relative: 1 % (ref 0.0–5.0)
HCT: 35.4 % — ABNORMAL LOW (ref 36.0–46.0)
Hemoglobin: 12.3 g/dL (ref 12.0–15.0)
Lymphocytes Relative: 69.7 % — ABNORMAL HIGH (ref 12.0–46.0)
Lymphs Abs: 3.3 10*3/uL (ref 0.7–4.0)
Monocytes Relative: 6.5 % (ref 3.0–12.0)
Platelets: 260 10*3/uL (ref 150.0–400.0)
WBC: 4.8 10*3/uL (ref 4.5–10.5)

## 2010-12-15 NOTE — Telephone Encounter (Signed)
Dr. Deborah Chalk notified of CBC results and instructed RN to have pt f/u ASAP with Dr. Lendon Colonel regarding low neutrophils and that it is ok to stop Plavix due to pt's bruising. Pt notified of CBC results and was instructed to f/u Rheumatologist Dr. Lendon Colonel ASAP regarding low neutrophils and to stop Plavix.  Pt verbalized to RN understanding of instructions.

## 2010-12-20 NOTE — Progress Notes (Signed)
Pt notified of CBC results on 12/15/10.  See telephone call on 12/15/10.

## 2011-01-04 ENCOUNTER — Other Ambulatory Visit: Payer: Self-pay | Admitting: Cardiology

## 2011-02-04 IMAGING — CR DG CERVICAL SPINE 2 OR 3 VIEWS
3 series · 3 of 3 positions shown · non-contrast
Comparison: 01/28/2008.

CLINICAL DATA: Neck pain for for 5 days.

CERVICAL SPINE - 2-3 VIEW

[w c-spine lat]
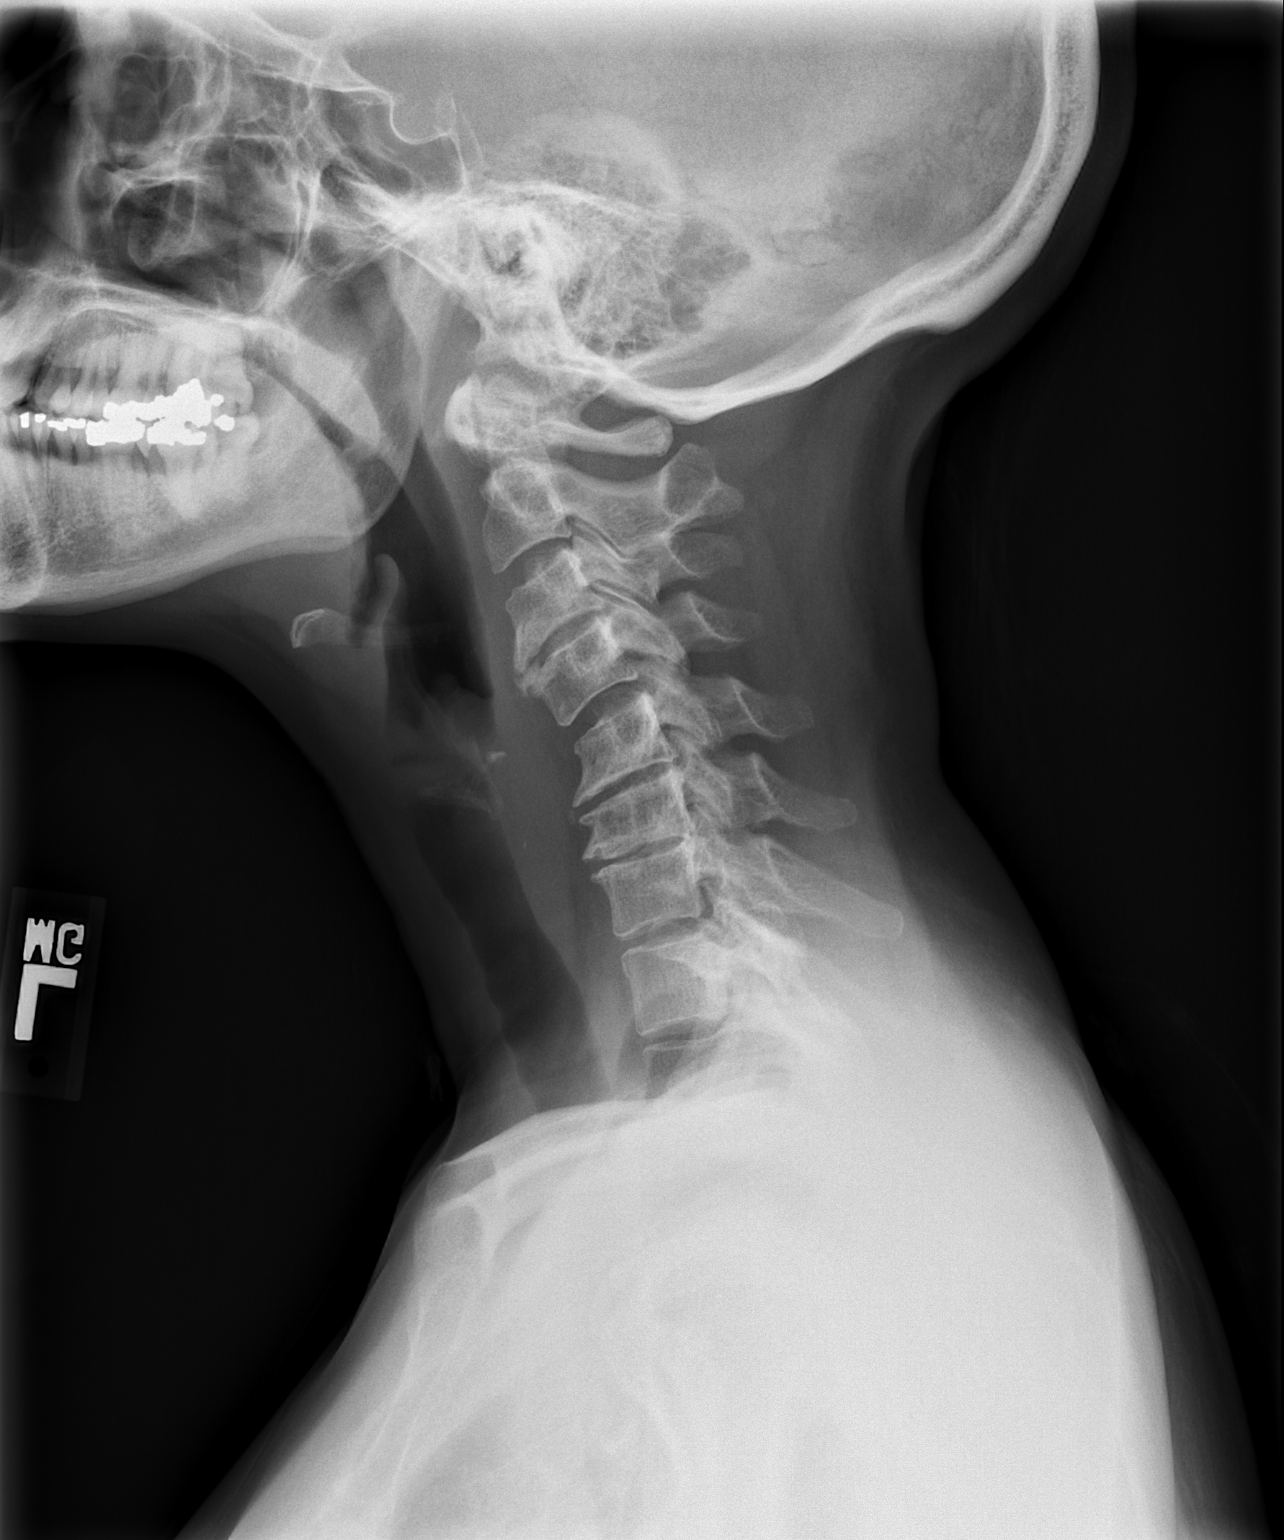

[w c-spine a.p. *]
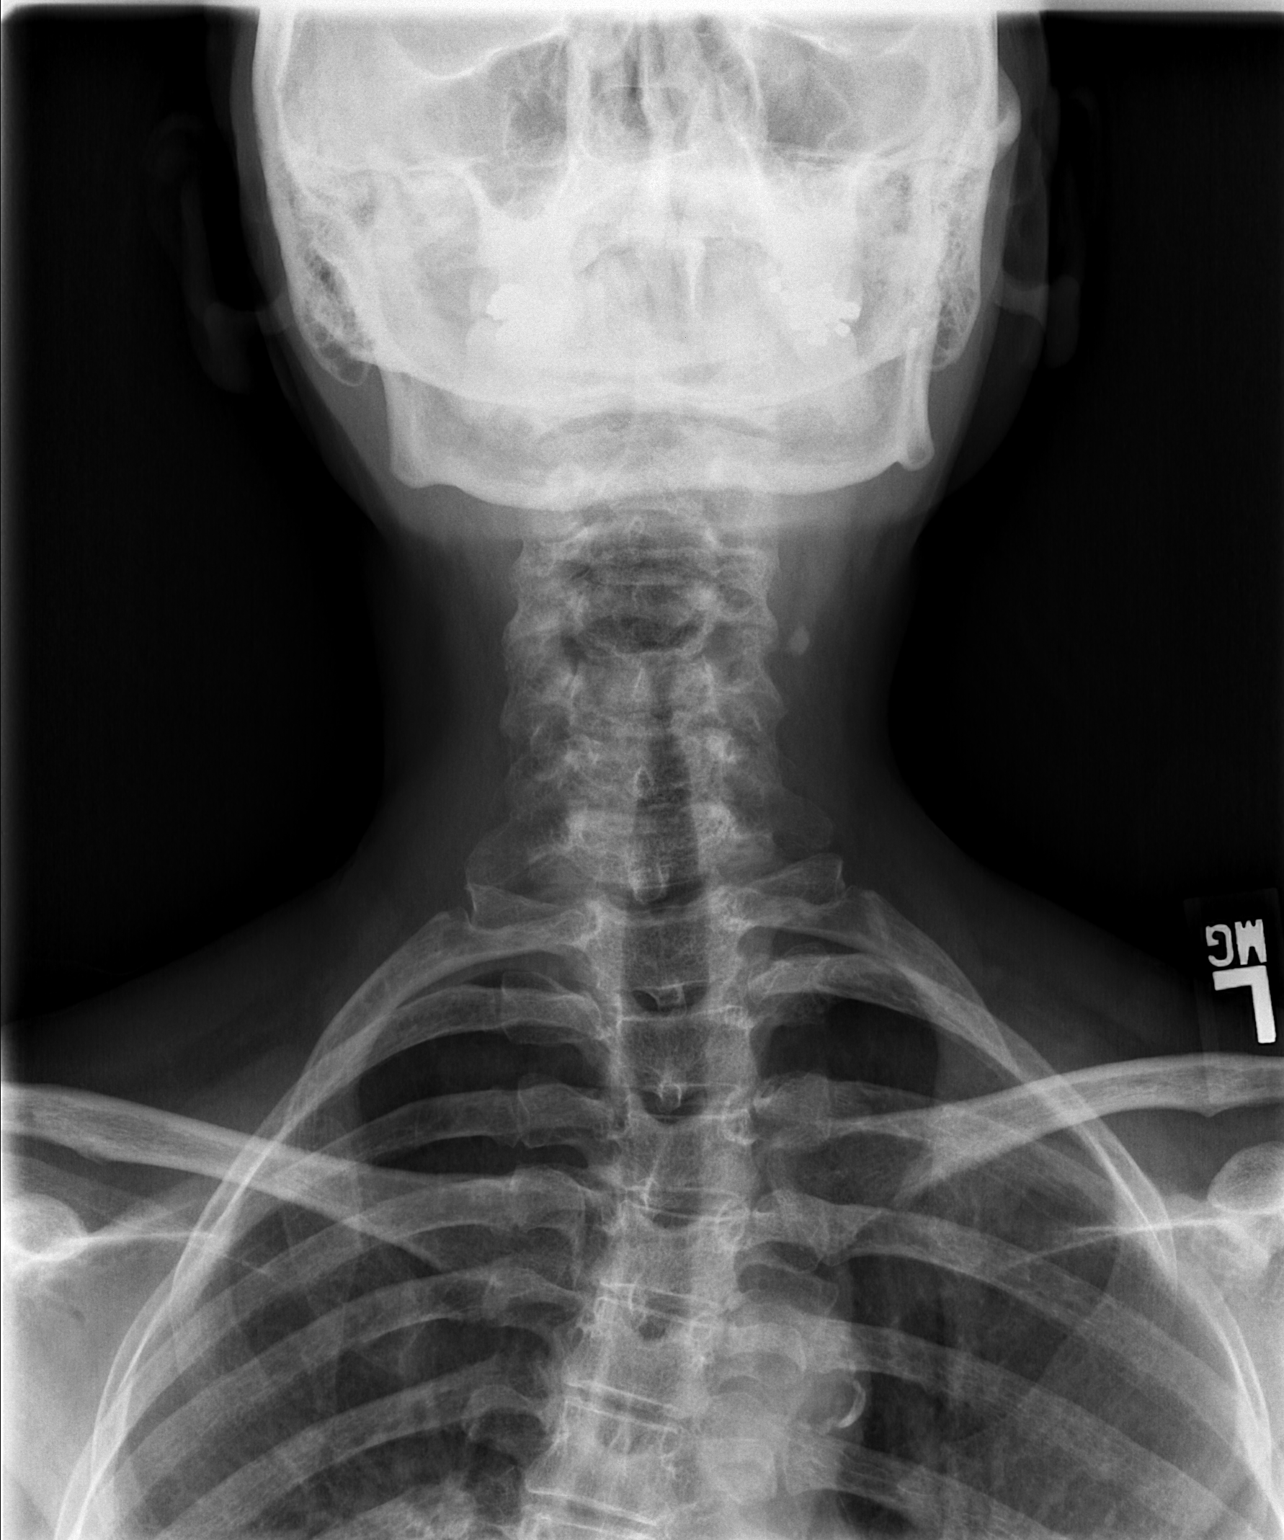

[w c-spine odontoid *]
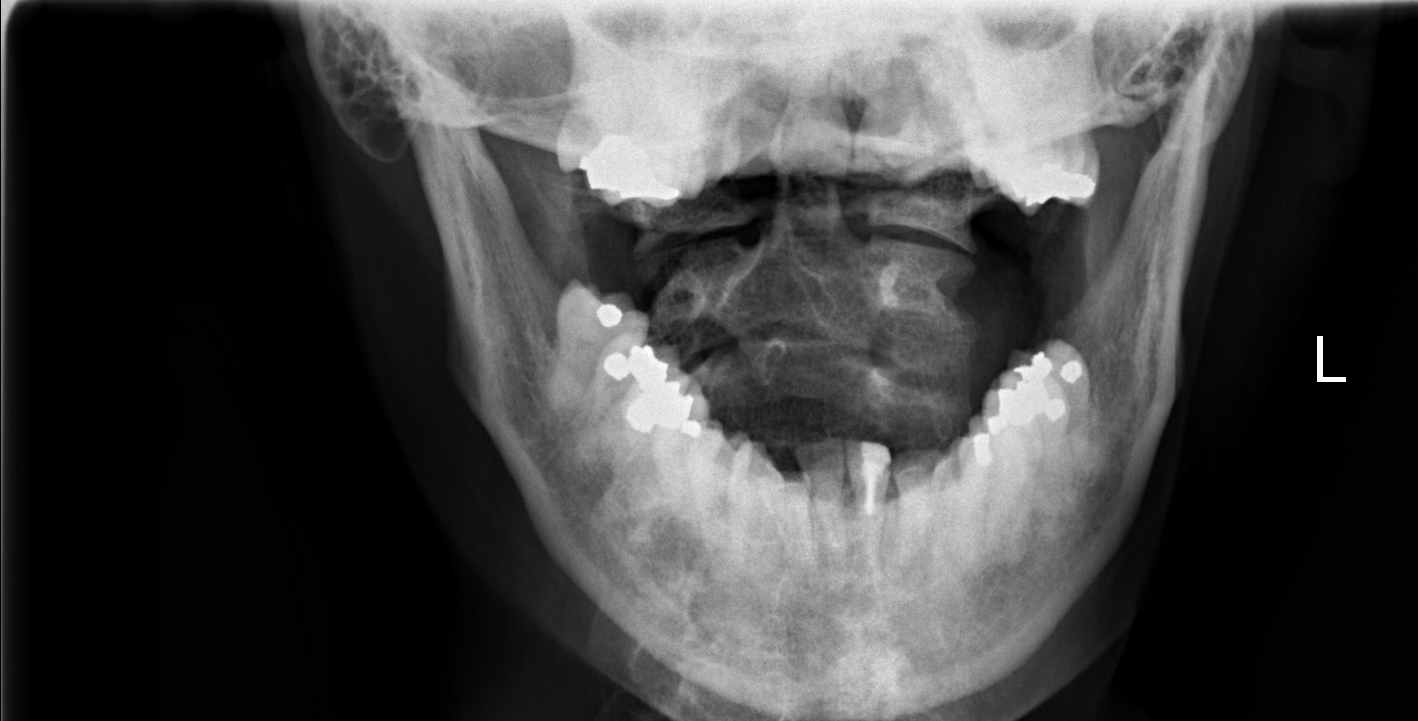

[3 of 3 positions shown; findings below may reference images not displayed]

FINDINGS: Degenerative disc space narrowing is noted AT THE C3-4,
C5-6, and C6-7 levels.  There are no fractures or subluxations and
there are no destructive changes.  Left carotid artery
calcification is present.  There is reversal of the normal cervical
lordosis.
IMPRESSION: Degenerative disc space narrowing noted C3-4, C5-6, and C6-7
levels.  Reversal of cervical lordosis.

## 2011-04-18 LAB — COMPREHENSIVE METABOLIC PANEL WITH GFR
ALT: 27
AST: 25
Albumin: 4.5
Calcium: 10
GFR calc Af Amer: >60
Sodium: 144
Total Protein: 7.2

## 2011-04-18 LAB — URINALYSIS, ROUTINE W REFLEX MICROSCOPIC
Bilirubin Urine: NEGATIVE
Glucose, UA: NEGATIVE
Hgb urine dipstick: NEGATIVE
Ketones, ur: NEGATIVE
Nitrite: NEGATIVE
Protein, ur: NEGATIVE
Specific Gravity, Urine: 1.023
Urobilinogen, UA: 0.2
pH: 6.5

## 2011-04-18 LAB — COMPREHENSIVE METABOLIC PANEL
Alkaline Phosphatase: 68
BUN: 23
CO2: 31
Chloride: 104
Creatinine, Ser: 0.8
GFR calc non Af Amer: >60
Glucose, Bld: 107 — ABNORMAL HIGH
Potassium: 4
Total Bilirubin: 0.4

## 2011-04-18 LAB — CBC
HCT: 42.9
Hemoglobin: 14.8
MCHC: 34.5
MCV: 90.7
Platelets: 344
RBC: 4.73
RDW: 14.3
WBC: 13.7 — ABNORMAL HIGH

## 2011-04-18 LAB — DIFFERENTIAL
Basophils Absolute: 0.1
Basophils Relative: 1
Eosinophils Absolute: 0
Eosinophils Relative: 0
Lymphocytes Relative: 43
Lymphs Abs: 5.9 — ABNORMAL HIGH
Monocytes Absolute: 0.5
Monocytes Relative: 3
Neutro Abs: 7.2
Neutrophils Relative %: 53

## 2011-04-18 LAB — POCT CARDIAC MARKERS
CKMB, poc: <1 — ABNORMAL LOW
Myoglobin, poc: 28.9
Troponin i, poc: <0.05

## 2011-04-18 LAB — LIPASE, BLOOD: Lipase: 103

## 2011-04-18 LAB — OCCULT BLOOD X 1 CARD TO LAB, STOOL: Fecal Occult Bld: NEGATIVE

## 2011-04-27 LAB — DIFFERENTIAL
Basophils Absolute: 0
Basophils Relative: 0
Eosinophils Relative: 1
Lymphocytes Relative: 66 — ABNORMAL HIGH
Neutro Abs: 1.4 — ABNORMAL LOW

## 2011-04-27 LAB — COMPREHENSIVE METABOLIC PANEL
AST: 19
Albumin: 4.1
Alkaline Phosphatase: 70
CO2: 30
Chloride: 104
GFR calc Af Amer: >60
GFR calc non Af Amer: >60
Potassium: 3.8
Total Bilirubin: 0.5

## 2011-04-27 LAB — CBC
HCT: 40.5
MCHC: 33.6
Platelets: 296
Platelets: 351
RBC: 4.39
RDW: 15 — ABNORMAL HIGH
WBC: 7.2

## 2011-04-27 LAB — CARDIAC PANEL(CRET KIN+CKTOT+MB+TROPI)
CK, MB: 1
CK, MB: 1.2
Relative Index: INVALID
Total CK: 61
Troponin I: 0.01
Troponin I: <0.01

## 2011-04-27 LAB — TSH: TSH: 1.035

## 2011-04-27 LAB — APTT: aPTT: 28

## 2011-05-29 ENCOUNTER — Encounter: Payer: Self-pay | Admitting: Cardiovascular Disease

## 2011-05-29 ENCOUNTER — Ambulatory Visit (INDEPENDENT_AMBULATORY_CARE_PROVIDER_SITE_OTHER): Payer: Federal, State, Local not specified - PPO | Admitting: Cardiovascular Disease

## 2011-05-29 VITALS — BP 116/80 | HR 57 | Ht 59.0 in | Wt 110.0 lb

## 2011-05-29 DIAGNOSIS — I2510 Atherosclerotic heart disease of native coronary artery without angina pectoris: Secondary | ICD-10-CM | POA: Insufficient documentation

## 2011-05-29 DIAGNOSIS — E785 Hyperlipidemia, unspecified: Secondary | ICD-10-CM

## 2011-05-29 DIAGNOSIS — F17200 Nicotine dependence, unspecified, uncomplicated: Secondary | ICD-10-CM

## 2011-05-29 DIAGNOSIS — Z720 Tobacco use: Secondary | ICD-10-CM

## 2011-05-29 NOTE — Assessment & Plan Note (Signed)
Stable. Continue current meds including ASA, beta blocker and statin. She has been off of Plavix and doing well.

## 2011-05-29 NOTE — Patient Instructions (Signed)
Your physician wants you to follow-up in: 12 months.   You will receive a reminder letter in the mail two months in advance. If you don't  receive a letter, please call our office to schedule the follow-up appointment.  Please continue current medications as directed.

## 2011-05-29 NOTE — Progress Notes (Signed)
History of Present Illness:55 yo AAF with history CAD, tobacco abuse, cutaneous lupus, anxiety, depression, HLD, mild PVD here today for cardiac follow up. She has been followed in the past by Dr. Deborah Chalk. Her cardiac history dates back to 2001 when she had a PCI of the RCA followed by a stent in the RCA in 2002. Her last cath was in 2010 at which time her RCA was subtotally occluded distally. There were two overlapping drug eluting stents placed in the distal RCA. Her LV function was normal. She was noted to have moderate disease in the Circumflex artery with minimal disease in the LAD.    She tells me that she has been doing well. She has had no chest pain, SOB, palpitations, near syncope or syncope. She has been smoking 1/2 ppd but is trying to stop.  Lipids were checked in May 2012 with total cholesterol 123, LDL 64. She is tolerating all meds.   Her primary care is Dr. Nolene Ebbs [facility])  Past Medical History  Diagnosis Date  . CAD (coronary artery disease)     Remote PCI to RCA in 2001, stent to the RCA in 2002, with last cath in 2010 with placement of two overlapping drug eluting  stents to the RCA  . Cutaneous lupus erythematosus   . Anxiety   . Depression   . Noncompliance   . PVD (peripheral vascular disease)     MILD  . H/O: substance abuse   . Hyperlipidemia     Past Surgical History  Procedure Date  . Cardiac catheterization 01/04/2009    CARDIAC SIZE NORMAL. EF IS 60%  . Coronary stent placement 12/2008    X2 to the RCA  . Coronary angioplasty 08/1999    RCA  . Coronary stent placement 05/2001    RCA    Current Outpatient Prescriptions  Medication Sig Dispense Refill  . aspirin 81 MG tablet Take 81 mg by mouth daily.        Marland Kitchen ezetimibe-simvastatin (VYTORIN) 10-40 MG per tablet Take 1 tablet by mouth at bedtime.  90 tablet  3  . HYDROcodone-acetaminophen (NORCO) 5-325 MG per tablet       . hydroxychloroquine (PLAQUENIL) 200 MG tablet Take 200 mg by mouth  daily. 2 DAILY      . metoprolol succinate (TOPROL XL) 25 MG 24 hr tablet Take 1 tablet (25 mg total) by mouth daily.  90 tablet  3  . valACYclovir (VALTREX) 500 MG tablet       . Zolpidem Tartrate (AMBIEN PO) Take by mouth as needed.         No Known Allergies  History   Social History  . Marital Status: Single    Spouse Name: N/A    Number of Children: N/A  . Years of Education: N/A   Occupational History  . Not on file.   Social History Main Topics  . Smoking status: Current Everyday Smoker -- 0.5 packs/day  . Smokeless tobacco: Never Used  . Alcohol Use: Yes  . Drug Use: Yes    Special: Marijuana  . Sexually Active:    Other Topics Concern  . Not on file   Social History Narrative  . No narrative on file    Family History  Problem Relation Age of Onset  . Hypertension Mother   . Lung cancer Mother   . Heart disease Father   . Hypertension Father   . Stroke Father   . Heart failure Father  Review of Systems:  As stated in the HPI and otherwise negative.   BP 116/80  Pulse 57  Ht 4\' 11"  (1.499 m)  Wt 110 lb (49.896 kg)  BMI 22.22 kg/m2  Physical Examination: General: Well developed, well nourished, NAD HEENT: OP clear, mucus membranes moist SKIN: warm, dry. No rashes. Neuro: No focal deficits Musculoskeletal: Muscle strength 5/5 all ext Psychiatric: Mood and affect normal Neck: No JVD, no carotid bruits, no thyromegaly, no lymphadenopathy. Lungs:Clear bilaterally, no wheezes, rhonci, crackles Cardiovascular: Regular rate and rhythm. No murmurs, gallops or rubs. Abdomen:Soft. Bowel sounds present. Non-tender.  Extremities: No lower extremity edema. Pulses are 2 + in the bilateral DP/PT.  EKG: sinus bradycardia, rate 57 bpm. Possible LAE. RVCD.  30 minutes spent reviewing old records with pt.

## 2011-05-29 NOTE — Assessment & Plan Note (Signed)
Lipids well controlled in May 2012. Continue statin. Cessation encouraged.

## 2011-06-13 ENCOUNTER — Telehealth: Payer: Self-pay | Admitting: Cardiovascular Disease

## 2011-06-13 NOTE — Telephone Encounter (Signed)
Pt calls today about taking ibuprofen her orthopaedist at [facility] prescribed for her inflammation. She is no longer on Plavix and is aware of the risk of increased bleeding taking ibuprofen. Pt also asked about the $29.00 fee charged for smoking. Referred pt to Billing dept and encouraged cessation. Mylo Red RN

## 2011-06-13 NOTE — Telephone Encounter (Signed)
New problem:   1. Patient was  directed by billing department to call our office regarding her statement. Patient was seen on 11/13 her statement is mark for  smoking cession  $ 29.00 . Office visit  195.00 diagnotic  test $ 78.00 . Patient is questioning why she was mark down for smoking cession. Patient states she  Does smokes. However, The Physician  didn't go-over any information concerning this. Patient also states she didn't go to any classes for smoking cession.   2. Can she take ibuprofen. For inflammation of the knee.

## 2011-06-13 NOTE — Telephone Encounter (Signed)
Left a message to call back.

## 2011-08-11 ENCOUNTER — Encounter (HOSPITAL_COMMUNITY): Payer: Self-pay | Admitting: Emergency Medicine

## 2011-08-11 ENCOUNTER — Other Ambulatory Visit: Payer: Self-pay

## 2011-08-11 ENCOUNTER — Emergency Department (HOSPITAL_COMMUNITY)
Admission: EM | Admit: 2011-08-11 | Discharge: 2011-08-11 | Disposition: A | Discharge status: Home / Self Care | Payer: Federal, State, Local not specified - PPO | Attending: Emergency Medicine | Admitting: Emergency Medicine

## 2011-08-11 DIAGNOSIS — Z79899 Other long term (current) drug therapy: Secondary | ICD-10-CM | POA: Insufficient documentation

## 2011-08-11 DIAGNOSIS — Z7982 Long term (current) use of aspirin: Secondary | ICD-10-CM | POA: Insufficient documentation

## 2011-08-11 DIAGNOSIS — M329 Systemic lupus erythematosus, unspecified: Secondary | ICD-10-CM | POA: Insufficient documentation

## 2011-08-11 DIAGNOSIS — I739 Peripheral vascular disease, unspecified: Secondary | ICD-10-CM | POA: Insufficient documentation

## 2011-08-11 DIAGNOSIS — R112 Nausea with vomiting, unspecified: Secondary | ICD-10-CM | POA: Insufficient documentation

## 2011-08-11 DIAGNOSIS — I498 Other specified cardiac arrhythmias: Secondary | ICD-10-CM | POA: Insufficient documentation

## 2011-08-11 DIAGNOSIS — I2510 Atherosclerotic heart disease of native coronary artery without angina pectoris: Secondary | ICD-10-CM | POA: Insufficient documentation

## 2011-08-11 DIAGNOSIS — F341 Dysthymic disorder: Secondary | ICD-10-CM | POA: Insufficient documentation

## 2011-08-11 DIAGNOSIS — R42 Dizziness and giddiness: Secondary | ICD-10-CM | POA: Insufficient documentation

## 2011-08-11 DIAGNOSIS — I252 Old myocardial infarction: Secondary | ICD-10-CM | POA: Insufficient documentation

## 2011-08-11 DIAGNOSIS — E785 Hyperlipidemia, unspecified: Secondary | ICD-10-CM | POA: Insufficient documentation

## 2011-08-11 HISTORY — DX: Acute myocardial infarction, unspecified: I21.9

## 2011-08-11 LAB — URINALYSIS, ROUTINE W REFLEX MICROSCOPIC
Bilirubin Urine: NEGATIVE
Glucose, UA: NEGATIVE mg/dL
Hgb urine dipstick: NEGATIVE
Ketones, ur: NEGATIVE mg/dL
Nitrite: NEGATIVE
Protein, ur: NEGATIVE mg/dL
Specific Gravity, Urine: 1.014 (ref 1.005–1.030)
Urobilinogen, UA: 0.2 mg/dL (ref 0.0–1.0)
pH: 6.5 (ref 5.0–8.0)

## 2011-08-11 LAB — CBC
HCT: 36.7 % (ref 36.0–46.0)
RDW: 14.6 % (ref 11.5–15.5)
WBC: 7 10*3/uL (ref 4.0–10.5)

## 2011-08-11 LAB — DIFFERENTIAL
Basophils Absolute: 0 10*3/uL (ref 0.0–0.1)
Basophils Relative: 0 % (ref 0–1)
Lymphocytes Relative: 43 % (ref 12–46)
Monocytes Absolute: 0.4 10*3/uL (ref 0.1–1.0)
Neutro Abs: 3.5 10*3/uL (ref 1.7–7.7)
Neutrophils Relative %: 50 % (ref 43–77)

## 2011-08-11 LAB — POCT I-STAT, CHEM 8
BUN: 13 mg/dL (ref 6–23)
Chloride: 108 mEq/L (ref 96–112)
Creatinine, Ser: 0.6 mg/dL (ref 0.50–1.10)
Glucose, Bld: 125 mg/dL — ABNORMAL HIGH (ref 70–99)
Potassium: 4.1 mEq/L (ref 3.5–5.1)

## 2011-08-11 LAB — URINE MICROSCOPIC-ADD ON

## 2011-08-11 MED ORDER — MECLIZINE HCL 25 MG PO TABS
50.0000 mg | ORAL_TABLET | Freq: Once | ORAL | Status: AC
  Administered 2011-08-11: 50 mg via ORAL
  Filled 2011-08-11: qty 2

## 2011-08-11 MED ORDER — ONDANSETRON HCL 4 MG PO TABS: 4.0000 mg | ORAL_TABLET | Freq: Four times a day (QID) | ORAL | Status: AC

## 2011-08-11 MED ORDER — MECLIZINE HCL 50 MG PO TABS: 25.0000 mg | ORAL_TABLET | Freq: Three times a day (TID) | ORAL | Status: AC | PRN

## 2011-08-11 MED ORDER — ACETAMINOPHEN 325 MG PO TABS
ORAL_TABLET | ORAL | Status: AC
  Filled 2011-08-11: qty 2

## 2011-08-11 MED ORDER — ACETAMINOPHEN 325 MG PO TABS
650.0000 mg | ORAL_TABLET | Freq: Once | ORAL | Status: AC
  Administered 2011-08-11: 650 mg via ORAL

## 2011-08-11 NOTE — ED Notes (Signed)
Per [facility], pt awoke with dizziness and headache this morning.  On arrival, pt found to be bradycardic with HR 40 and having n/v.  IV 22g left hand.

## 2011-08-11 NOTE — ED Provider Notes (Signed)
History     CSN: [id number]  Arrival date & time 08/11/11  1128   First MD Initiated Contact with Patient 08/11/11 1138      Chief Complaint  Patient presents with  . Dizziness  . Headache  . Bradycardia    (Consider location/radiation/quality/duration/timing/severity/associated sxs/prior treatment) The history is provided by the patient.  She woke this morning and sat up on the edge of the bed when she noticed she was very dizzy, like the room was spinning around. She experienced nausea with vomiting. Symptoms persisted and she came here for evaluation. She denies headache. She has not had these symptoms in the past. She reports a history of discoid lupus and heart disease. She denies chest pain. The patient reports that when she has been symptomatic with cardiac problems there was never isolated dizziness, there was always pain and SOB, and nothing like today's symptoms. She was found to be bradycardic by EMS, and she is unaware of this as part of her regular history.  Past Medical History  Diagnosis Date  . CAD (coronary artery disease)     Remote PCI to RCA in 2001, stent to the RCA in 2002, with last cath in 2010 with placement of two overlapping drug eluting  stents to the RCA  . Cutaneous lupus erythematosus   . Anxiety   . Depression   . Noncompliance   . PVD (peripheral vascular disease)     MILD  . H/O: substance abuse   . Hyperlipidemia   . Myocardial infarction     multiple - with stent placement    Past Surgical History  Procedure Date  . Abdominal hysterectomy     1999  . Right knee surgery   . Angioplasty     Family History  Problem Relation Age of Onset  . Hypertension Mother   . Lung cancer Mother   . Heart disease Father   . Hypertension Father   . Stroke Father   . Heart failure Father     History  Substance Use Topics  . Smoking status: Former Smoker -- 0.5 packs/day    Types: Cigarettes  . Smokeless tobacco: Never Used  . Alcohol Use:  4.2 oz/week    7 Cans of beer per week    OB History    Grav Para Term Preterm Abortions TAB SAB Ect Mult Living                  Review of Systems  Constitutional: Negative for fever and chills.  HENT: Negative.  Negative for ear pain and tinnitus.   Respiratory: Negative.   Cardiovascular: Negative.   Gastrointestinal: Positive for nausea and vomiting.  Musculoskeletal: Negative.   Skin: Negative.   Neurological: Positive for dizziness.    Allergies  Review of patient's allergies indicates no known allergies.  Home Medications   Current Outpatient Rx  Name Route Sig Dispense Refill  . ASPIRIN 81 MG PO TABS Oral Take 81 mg by mouth daily.      Marland Kitchen EZETIMIBE-SIMVASTATIN 10-40 MG PO TABS Oral Take 1 tablet by mouth at bedtime. 90 tablet 3  . HYDROXYCHLOROQUINE SULFATE 200 MG PO TABS Oral Take 400 mg by mouth daily. 2 DAILY    . METOPROLOL SUCCINATE ER 25 MG PO TB24 Oral Take 25 mg by mouth daily.    Marland Kitchen VARENICLINE TARTRATE 1 MG PO TABS Oral Take 1 mg by mouth 2 (two) times daily.    Marland Kitchen ZOLPIDEM TARTRATE 10 MG PO TABS Oral  Take 10 mg by mouth at bedtime as needed. For sleep      BP 149/82  Pulse 41  Temp(Src) 97.8 F (36.6 C) (Oral)  Resp 20  Ht 4\' 11"  (1.499 m)  Wt 110 lb (49.896 kg)  BMI 22.22 kg/m2  SpO2 100%  Physical Exam  ED Course  Procedures (including critical care   Patient reports improvement but not resolution to her symptoms. No further vomiting. I ambulated her and she states still dizzy, but better. Romberg negative.   Tolerating PO's without vomiting.   Labs Reviewed - No data to display No results found.   No diagnosis found.    MDM          Rodena Medin, PA-C 08/11/11 1527

## 2011-08-12 NOTE — ED Provider Notes (Signed)
Medical screening examination/treatment/procedure(s) were performed by non-physician practitioner and as supervising physician I was immediately available for consultation/collaboration.   Per pt, c/o sudden onset and persistence of constant "dizziness" that began this morning PTA.  Symptoms began when she got up this morning.  Pt describes her dizziness as "the room is spinning around."  Has been assoc with N/V, worsens with turning her head side to side.  Denies CP/palpitations, no SOB/cough, no headache, no visual changes, no focal motor weakness, no tingling/numbness in extremities.  VSS/bradycardic 40-50's (pt on beta blocker), afebrile, not orthostatic, resps easy, RRR, neuro exam non-focal.  Improved with antivert.  EKG without acute STTW changes, labs without acute abnl, Udip/micro with possible UTI, UC is pending (no c/o dysuria today).  Ambulates with steady gait, tol PO well.  Wants to go home.  Will d/c home with outpt f/u this week.     Laray Anger, DO 08/12/11 2004

## 2011-08-13 LAB — URINE CULTURE: Colony Count: >=100000

## 2011-08-16 NOTE — ED Notes (Signed)
Prescription called in to [facility] [address] at [phone] for cipro 500mg  po bid x7days per tatyana kirichenko, pa-c; no refills

## 2011-08-16 NOTE — ED Notes (Addendum)
+   Urine Rx for Cipro 500 mg tab disp 14 on tab po BID x 7 days need to be called to pharmacy written by Lemont Fillers

## 2012-01-07 ENCOUNTER — Encounter: Payer: Self-pay | Admitting: Cardiology

## 2012-02-29 ENCOUNTER — Telehealth: Payer: Self-pay | Admitting: Oncology

## 2012-02-29 ENCOUNTER — Encounter: Payer: Self-pay | Admitting: Oncology

## 2012-02-29 ENCOUNTER — Other Ambulatory Visit: Payer: Self-pay | Admitting: Oncology

## 2012-02-29 DIAGNOSIS — R803 Bence Jones proteinuria: Secondary | ICD-10-CM | POA: Insufficient documentation

## 2012-02-29 HISTORY — DX: Bence Jones proteinuria: R80.3

## 2012-02-29 NOTE — Telephone Encounter (Signed)
C/D on 8/16 for 9/25 visit.

## 2012-02-29 NOTE — Telephone Encounter (Signed)
Referring Dr. Zenovia Jordan Dx- M-Spike on [facility] NP packet mailed out.

## 2012-02-29 NOTE — Telephone Encounter (Signed)
Talked to pt , she is aware of appt for September 2014 as a new patient, she is aware of location

## 2012-03-03 ENCOUNTER — Telehealth: Payer: Self-pay | Admitting: Oncology

## 2012-03-03 NOTE — Telephone Encounter (Signed)
Talked to pt and gave her appt date for 9/11 , she is aware of MD visit

## 2012-03-18 ENCOUNTER — Telehealth: Payer: Self-pay | Admitting: Cardiovascular Disease

## 2012-03-18 MED ORDER — NITROGLYCERIN 0.4 MG SL SUBL: 0.4000 mg | SUBLINGUAL_TABLET | SUBLINGUAL | Status: DC | PRN

## 2012-03-18 NOTE — Telephone Encounter (Signed)
Pt needs a Rx called in for nitro asap to [facility] [address]

## 2012-03-26 ENCOUNTER — Encounter: Payer: Self-pay | Admitting: Oncology

## 2012-03-26 ENCOUNTER — Ambulatory Visit: Payer: Federal, State, Local not specified - PPO

## 2012-03-26 ENCOUNTER — Other Ambulatory Visit (HOSPITAL_BASED_OUTPATIENT_CLINIC_OR_DEPARTMENT_OTHER): Payer: Federal, State, Local not specified - PPO | Admitting: Lab

## 2012-03-26 DIAGNOSIS — R809 Proteinuria, unspecified: Secondary | ICD-10-CM

## 2012-03-26 DIAGNOSIS — R803 Bence Jones proteinuria: Secondary | ICD-10-CM

## 2012-03-26 LAB — CBC & DIFF AND RETIC
BASO%: 0.2 % (ref 0.0–2.0)
Basophils Absolute: 0 10e3/uL (ref 0.0–0.1)
EOS%: 0.5 % (ref 0.0–7.0)
Eosinophils Absolute: 0 10e3/uL (ref 0.0–0.5)
HCT: 40.8 % (ref 34.8–46.6)
HGB: 13.3 g/dL (ref 11.6–15.9)
Immature Retic Fract: 17.3 % — ABNORMAL HIGH (ref 1.60–10.00)
LYMPH%: 47 % (ref 14.0–49.7)
MCH: 30 pg (ref 25.1–34.0)
MCHC: 32.6 g/dL (ref 31.5–36.0)
MCV: 91.9 fL (ref 79.5–101.0)
MONO#: 0.3 10e3/uL (ref 0.1–0.9)
MONO%: 5.9 % (ref 0.0–14.0)
NEUT#: 2.6 10e3/uL (ref 1.5–6.5)
NEUT%: 46.4 % (ref 38.4–76.8)
Platelets: 289 10e3/uL (ref 145–400)
RBC: 4.44 10e6/uL (ref 3.70–5.45)
RDW: 15.3 % — ABNORMAL HIGH (ref 11.2–14.5)
Retic %: 1.53 % (ref 0.70–2.10)
Retic Ct Abs: 67.93 10e3/uL (ref 33.70–90.70)
WBC: 5.6 10e3/uL (ref 3.9–10.3)
lymph#: 2.6 10e3/uL (ref 0.9–3.3)

## 2012-03-26 LAB — MORPHOLOGY
PLT EST: ADEQUATE
RBC Comments: NORMAL

## 2012-03-26 LAB — COMPREHENSIVE METABOLIC PANEL (CC13)
Albumin: 4 g/dL (ref 3.5–5.0)
Alkaline Phosphatase: 62 U/L (ref 40–150)
BUN: 17 mg/dL (ref 7.0–26.0)
Glucose: 108 mg/dl — ABNORMAL HIGH (ref 70–99)
Potassium: 4.4 mEq/L (ref 3.5–5.1)
Total Bilirubin: 0.3 mg/dL (ref 0.20–1.20)

## 2012-03-26 NOTE — Progress Notes (Signed)
Checked in new pt w/ no financial concerns. °

## 2012-03-28 ENCOUNTER — Telehealth: Payer: Self-pay | Admitting: *Deleted

## 2012-03-28 LAB — KAPPA/LAMBDA LIGHT CHAINS
Kappa free light chain: 0.2 mg/dL — ABNORMAL LOW (ref 0.33–1.94)
Lambda Free Lght Chn: 0.85 mg/dL (ref 0.57–2.63)

## 2012-03-28 NOTE — Telephone Encounter (Signed)
Patient will collect a 24 hr urine on Sunday to bring in on 03-31-2012.  Asked can she drink beer or will this effect the test results.  UIFE/TP is ordered.  Called Lab and beer should not effect the test results per [place] [facility] [place].  Patient notified beer should not effect results.  Denies being a big drinker just likes to have a beer on her off days.

## 2012-04-02 LAB — UIFE/LIGHT CHAINS/TP QN, 24-HR UR
Free Kappa Lt Chains,Ur: 1.19 mg/dL (ref 0.14–2.42)
Free Lambda Excretion/Day: 1.19 mg/d
Free Lambda Lt Chains,Ur: 0.06 mg/dL (ref 0.02–0.67)
Gamma Globulin, Urine: DETECTED — AB
Time: 24 hours
Volume, Urine: 1990 mL

## 2012-04-09 ENCOUNTER — Ambulatory Visit (HOSPITAL_BASED_OUTPATIENT_CLINIC_OR_DEPARTMENT_OTHER): Payer: Federal, State, Local not specified - PPO | Admitting: Oncology

## 2012-04-09 ENCOUNTER — Ambulatory Visit: Payer: Federal, State, Local not specified - PPO

## 2012-04-09 ENCOUNTER — Telehealth: Payer: Self-pay | Admitting: Oncology

## 2012-04-09 ENCOUNTER — Other Ambulatory Visit: Payer: Federal, State, Local not specified - PPO | Admitting: Lab

## 2012-04-09 VITALS — BP 139/73 | HR 59 | Temp 97.1°F | Resp 18 | Ht 59.0 in | Wt 110.3 lb

## 2012-04-09 DIAGNOSIS — R809 Proteinuria, unspecified: Secondary | ICD-10-CM

## 2012-04-09 DIAGNOSIS — L930 Discoid lupus erythematosus: Secondary | ICD-10-CM

## 2012-04-09 DIAGNOSIS — L989 Disorder of the skin and subcutaneous tissue, unspecified: Secondary | ICD-10-CM

## 2012-04-09 DIAGNOSIS — R229 Localized swelling, mass and lump, unspecified: Secondary | ICD-10-CM

## 2012-04-09 DIAGNOSIS — R803 Bence Jones proteinuria: Secondary | ICD-10-CM

## 2012-04-09 NOTE — Progress Notes (Signed)
New Patient Hematology-Oncology Evaluation   [name] [id number] 1955/11/14 56 y.o. 04/09/2012  CC: Dr. Zenovia Jordan; Dr. Arvella Merles; [facility]; Dr. Campbell Stall   Reason for referral: Further evaluation of an M-spike reported on a routine spot urine analysis 02/12/2012 in a woman with discoid lupus   HPI:  Pleasant 56 year old woman looking much younger than her stated age diagnosed with discoid lupus back in 1999 when she developed nodular hyperpigmented skin lesions. Initial biopsy done by Dr. Campbell Stall. She uses when necessary topical steroid cream. She is not on any immunosuppressive drugs except for Plaquenil. She has not developed any other major manifestation of lupus except for Reynaud's phenomenon. She has no polyarthralgia, polymyalgia, no history of renal dysfunction. She has had multiple surgeries on her right knee due to anterior cruciate ligament tear with poor wound healing. As part of routine laboratory studies done on July 30 of this year, a spot urine was done which showed a Mspike of 7.1% with no reference range reported. Total protein in the spot urine 10.3 mg percent normal range 0-15. A serum protein electrophoresis done the same day did not show an M spike or an elevation in the gamma globulin fraction.  She was never told that she was anemic. There is no family history of any blood disorder.   PMH: Past Medical History  Diagnosis Date  . CAD (coronary artery disease)     Remote PCI to RCA in 2001, stent to the RCA in 2002, with last cath in 2010 with placement of two overlapping drug eluting  stents to the RCA  . Cutaneous lupus erythematosus   . Anxiety   . Depression   . Noncompliance   . PVD (peripheral vascular disease)     MILD  . H/O: substance abuse   . Hyperlipidemia   . Myocardial infarction     multiple - with stent placement  .  02/29/2012    Random urine "M spike" 7.1% 02/13/12  No hypertension. Previous MI x2 in  2000 requiring 2 stents and again in 2002 requiring an additional stent. Borderline hyperglycemia controlled with diet. No history of asthma, emphysema, tuberculosis, ulcers, hepatitis, yellow jaundice, malaria, kidney problems, thyroid disease, seizure, or stroke.  Past Surgical History  Procedure Date  . Abdominal hysterectomy     1999  . Right knee surgery multiple times for anterior cruciate ligament problems    . Angioplasty    tonsillectom at age 11y  Allergies: No Known Allergies  Medications: Aspirin 81 mg daily, clobetasol cream 0.05% when necessary, Flexeril 10 mg twice a day when necessary for recent left shoulder cramps, Vytorin 10/40 one at bedtime, Plaquenil 200 mg daily, Vistaril 10 mg when necessary, Toprol-XL 25 mg daily, Nitrostat 0.4 mg sublingual when necessary chest pain, Ambien 10 mg at bedtime when necessary sleep   Social History: She is single. Never married. Never pregnant. She works at the [employer].  reports that she has quit smoking. Her smoking use included Cigarettes. She smoked .5 packs per day. She has never used smokeless tobacco. She reports that she drinks about 4.2 ounces of alcohol per week. She reports that she uses illicit drugs (Marijuana).  Family History: Family History  Problem Relation Age of Onset  . Hypertension Mother   . Lung cancer Mother   . Heart disease Father   . Hypertension Father   . Stroke Father   . Heart failure Father     Review of Systems: Constitutional  symptoms:No constitutional symptoms  HEENT:No sore throat  Respiratory: No cough or dyspnea  Cardiovascular:  Occasional chest pressure when she gets very anxious  Gastrointestinal ROS: No change in bowel habit  Genito-Urinary ROS: No urinary tract symptoms  Hematological and Lymphatic: Musculoskeletal:For the last 2 months she has had atypical pain and cramping in the left scapular area. She was referred to neurology and had nerve conduction studies. She was put  on a trial of when necessary Flexeril  Neurologic:No headache or change in vision  Dermatologic:Chronic skin lesions from discoid lupus.  Remaining ROS negative.  Physical Exam: Blood pressure 139/73, pulse 59, temperature 97.1 F (36.2 C), temperature source Oral, resp. rate 18, height 4\' 11"  (1.499 m), weight 110 lb 4.8 oz (50.032 kg). Wt Readings from Last 3 Encounters:  04/09/12 110 lb 4.8 oz (50.032 kg)  08/11/11 110 lb (49.896 kg)  05/29/11 110 lb (49.896 kg)    General appearance:Thin, African American woman, looks younger than stated age  Head: Normal  Neck: Full range of motion  Lymph nodes:No cervical, supraclavicular, or axillary adenopathy  Breasts:Not examined  Lungs:Clear to auscultation resonant to percussion  Heart:Regular rhythm no murmur  Abdominal:Soft nontender no mass no organomegaly  GU:Not examined  Extremities:No edema no calf tenderness. No cyanosis.  Neurologic:Currently no vascular compromise  Skin:2-3 cm subcutaneous firm movable nodule left scapular area; Multiple nodular, hyperpigmented and macular skin lesions trunk and extremities from known discoid lupus    Lab Results: Lab Results  Component Value Date   WBC 5.6 03/26/2012   HGB 13.3 03/26/2012   HCT 40.8 03/26/2012   MCV 91.9 03/26/2012   PLT 289 03/26/2012     Chemistry      Component Value Date/Time   NA 141 03/26/2012 1004   NA 144 08/11/2011 1229   K 4.4 03/26/2012 1004   K 4.1 08/11/2011 1229   CL 107 03/26/2012 1004   CL 108 08/11/2011 1229   CO2 22 03/26/2012 1004   CO2 28 12/04/2010 0927   BUN 17.0 03/26/2012 1004   BUN 13 08/11/2011 1229   CREATININE 0.8 03/26/2012 1004   CREATININE 0.60 08/11/2011 1229      Component Value Date/Time   CALCIUM 9.6 03/26/2012 1004   CALCIUM 9.2 12/04/2010 0927   ALKPHOS 62 03/26/2012 1004   ALKPHOS 55 12/04/2010 0927   AST 31 03/26/2012 1004   AST 26 12/04/2010 0927   ALT 27 03/26/2012 1004   ALT 24 12/04/2010 0927   BILITOT 0.30 03/26/2012 1004    BILITOT 0.4 12/04/2010 0927     A 24-hour urine for total protein and immunofixation electrophoresis done through this office on 03/31/2012 shows normal amount of protein 36 mg) 10-140). This is nonselective proteinuria with no monoclonal free light chains.  Serum total immunoglobulins with a slight decrease in total IgG at 637 mg percent with normal IgA 207, and IgM 117. No monoclonal proteins seen on immunofixation electrophoresis  Serum free kappa light chains are actually decreased which is insignificant with normal lambda free light chains.   Impression and Plan:  #1.  Spurious result on spot urine protein electrophoresis. No concerned identified No evidence for a monoclonal gammopathy no further evaluation indicated  #2. Firm 2-3 cm subcutaneous left scapular skin lesion I suspect that this is a dermatofibroma. I am making a Gen. surgery referral for excision.  #3. Discoid lupus      Levert Feinstein, MD 04/09/2012, 11:09 AM

## 2012-04-09 NOTE — Telephone Encounter (Signed)
gve the pt he appt with dr Biagio Quint at [facility] for the skin nodule for this Friday

## 2012-04-11 ENCOUNTER — Ambulatory Visit (INDEPENDENT_AMBULATORY_CARE_PROVIDER_SITE_OTHER): Payer: Federal, State, Local not specified - PPO | Admitting: General Surgery

## 2012-04-11 ENCOUNTER — Encounter (INDEPENDENT_AMBULATORY_CARE_PROVIDER_SITE_OTHER): Payer: Self-pay | Admitting: General Surgery

## 2012-04-11 ENCOUNTER — Other Ambulatory Visit (INDEPENDENT_AMBULATORY_CARE_PROVIDER_SITE_OTHER): Payer: Self-pay | Admitting: General Surgery

## 2012-04-11 VITALS — BP 124/80 | HR 60 | Temp 99.0°F | Resp 18 | Ht 59.0 in | Wt 106.2 lb

## 2012-04-11 DIAGNOSIS — R229 Localized swelling, mass and lump, unspecified: Secondary | ICD-10-CM

## 2012-04-11 NOTE — Progress Notes (Signed)
Patient ID: [name], female   DOB: [birth year], 56 y.o.   MRN: [id number]  Chief Complaint  Patient presents with  . New Evaluation    skin nodule    HPI [name] is a 56 y.o. female.  Referred by Dr. Cyndie Chime for evaluation of left back mass.  She says that she noticed a "knot" on her left shoulder since March which feels like and "electric" feeling and mild discomfort when she turns certain ways.  She denies any change in the size or discomfort since then.  She denies any drainage, or redness.  She does have a history of MI in 2002 and has 3 stents in place.  She is followed by a cardiologist and is due to see them in November. HPI  Past Medical History  Diagnosis Date  . CAD (coronary artery disease)     Remote PCI to RCA in 2001, stent to the RCA in 2002, with last cath in 2010 with placement of two overlapping drug eluting  stents to the RCA  . Cutaneous lupus erythematosus   . Anxiety   . Depression   . Noncompliance   . PVD (peripheral vascular disease)     MILD  . H/O: substance abuse   . Hyperlipidemia   . Myocardial infarction     multiple - with stent placement  . Bence Jones proteinuria 02/29/2012    Random urine "M spike" 7.1% 02/13/12    Past Surgical History  Procedure Date  . Abdominal hysterectomy     1999  . Right knee surgery   . Angioplasty     Family History  Problem Relation Age of Onset  . Hypertension Mother   . Lung cancer Mother   . Heart disease Father   . Hypertension Father   . Stroke Father   . Heart failure Father     Social History History  Substance Use Topics  . Smoking status: Former Smoker -- 0.5 packs/day    Types: Cigarettes  . Smokeless tobacco: Never Used  . Alcohol Use: 4.2 oz/week    7 Cans of beer per week    No Known Allergies  Current Outpatient Prescriptions  Medication Sig Dispense Refill  . amoxicillin (AMOXIL) 500 MG capsule Before dental visit on 10/4      . aspirin 81 MG tablet Take 81 mg  by mouth daily.        . clobetasol cream (TEMOVATE) 0.05 %       . cyclobenzaprine (FLEXERIL) 10 MG tablet 10 mg 2 (two) times daily as needed.       . ezetimibe-simvastatin (VYTORIN) 10-40 MG per tablet Take 1 tablet by mouth at bedtime.      . hydroxychloroquine (PLAQUENIL) 200 MG tablet Take 200 mg by mouth daily. 2 DAILY      . hydrOXYzine (ATARAX/VISTARIL) 10 MG tablet       . metoprolol succinate (TOPROL-XL) 25 MG 24 hr tablet Take 25 mg by mouth daily.      . nitroGLYCERIN (NITROSTAT) 0.4 MG SL tablet Place 1 tablet (0.4 mg total) under the tongue every 5 (five) minutes as needed.  25 tablet  12  . zolpidem (AMBIEN) 10 MG tablet Take 10 mg by mouth at bedtime as needed. For sleep        Review of Systems Review of Systems All other review of systems negative or noncontributory except as stated in the HPI  Blood pressure 124/80, pulse 60, temperature 99 F (37.2 C),  temperature source Oral, resp. rate 18, height 4\' 11"  (1.499 m), weight 106 lb 3.2 oz (48.172 kg).  Physical Exam Physical Exam Physical Exam  Nursing note and vitals reviewed. Constitutional: She is oriented to person, place, and time. She appears well-developed and well-nourished. No distress.  HENT:  Head: Normocephalic and atraumatic.  Mouth/Throat: No oropharyngeal exudate.  Eyes: Conjunctivae and EOM are normal. Pupils are equal, round, and reactive to light. Right eye exhibits no discharge. Left eye exhibits no discharge. No scleral icterus.  Neck: Normal range of motion. Neck supple. No tracheal deviation present.  Cardiovascular: Normal rate, regular rhythm, normal heart sounds and intact distal pulses.   Pulmonary/Chest: Effort normal and breath sounds normal. No stridor. No respiratory distress. She has no wheezes.  Abdominal: Soft. Bowel sounds are normal. She exhibits no distension and no mass. There is no tenderness. There is no rebound and no guarding.  Musculoskeletal: Normal range of motion. She  exhibits no edema and no tenderness.  Neurological: She is alert and oriented to person, place, and time.  Skin: Skin is warm and dry. No rash noted. She is not diaphoretic. No erythema. No pallor. She has a well circumscribed, mobile, mass about 2cm wide just medial to the left scapula. No overlying skin changes. Psychiatric: She has a normal mood and affect. Her behavior is normal. Judgment and thought content normal.    Data Reviewed   Assessment    Left back mass I am not sure exactly what this is but it is mobile.  I do not think that this is causing the symptoms that she describes and I explained this to her.  I recommended MRI of the thorax preoperatively which may indicate a source for this discomfort as well as shed some light on what this mass may be.  Regardless, she would like to have this removed and so I discussed with her the procedure and the risks including infection, bleeding, pain, scarring, recurrence, persistent symptoms, nerve injury, and need for future surgery and she expressed understanding and desires to proceed with excision of left back mass.  We will have her see her cardiologist and get MRI prior to her scheduled OR date but will work on scheduling now.      Plan    Cardiac evaluation MRI Will set her up for surgical excision.       Lodema Pilot DAVID 04/11/2012, 9:48 AM

## 2012-04-13 ENCOUNTER — Emergency Department (HOSPITAL_COMMUNITY)
Admission: EM | Admit: 2012-04-13 | Discharge: 2012-04-13 | Disposition: A | Discharge status: Home / Self Care | Payer: Federal, State, Local not specified - PPO | Attending: Emergency Medicine | Admitting: Emergency Medicine

## 2012-04-13 ENCOUNTER — Encounter (HOSPITAL_COMMUNITY): Payer: Self-pay | Admitting: Emergency Medicine

## 2012-04-13 ENCOUNTER — Emergency Department (HOSPITAL_COMMUNITY): Payer: Federal, State, Local not specified - PPO

## 2012-04-13 DIAGNOSIS — R229 Localized swelling, mass and lump, unspecified: Secondary | ICD-10-CM | POA: Insufficient documentation

## 2012-04-13 DIAGNOSIS — F3289 Other specified depressive episodes: Secondary | ICD-10-CM | POA: Insufficient documentation

## 2012-04-13 DIAGNOSIS — I252 Old myocardial infarction: Secondary | ICD-10-CM | POA: Insufficient documentation

## 2012-04-13 DIAGNOSIS — Z9861 Coronary angioplasty status: Secondary | ICD-10-CM | POA: Insufficient documentation

## 2012-04-13 DIAGNOSIS — I2510 Atherosclerotic heart disease of native coronary artery without angina pectoris: Secondary | ICD-10-CM | POA: Insufficient documentation

## 2012-04-13 DIAGNOSIS — R0789 Other chest pain: Secondary | ICD-10-CM

## 2012-04-13 DIAGNOSIS — F17200 Nicotine dependence, unspecified, uncomplicated: Secondary | ICD-10-CM | POA: Insufficient documentation

## 2012-04-13 DIAGNOSIS — F411 Generalized anxiety disorder: Secondary | ICD-10-CM | POA: Insufficient documentation

## 2012-04-13 DIAGNOSIS — I739 Peripheral vascular disease, unspecified: Secondary | ICD-10-CM | POA: Insufficient documentation

## 2012-04-13 DIAGNOSIS — R071 Chest pain on breathing: Secondary | ICD-10-CM | POA: Insufficient documentation

## 2012-04-13 DIAGNOSIS — M546 Pain in thoracic spine: Secondary | ICD-10-CM | POA: Insufficient documentation

## 2012-04-13 DIAGNOSIS — F329 Major depressive disorder, single episode, unspecified: Secondary | ICD-10-CM | POA: Insufficient documentation

## 2012-04-13 DIAGNOSIS — E785 Hyperlipidemia, unspecified: Secondary | ICD-10-CM | POA: Insufficient documentation

## 2012-04-13 LAB — CBC
HCT: 36.4 % (ref 36.0–46.0)
MCH: 30.8 pg (ref 26.0–34.0)
MCV: 91.2 fL (ref 78.0–100.0)
Platelets: 305 10*3/uL (ref 150–400)
RDW: 15.2 % (ref 11.5–15.5)

## 2012-04-13 LAB — BASIC METABOLIC PANEL
BUN: 16 mg/dL (ref 6–23)
CO2: 25 mEq/L (ref 19–32)
Calcium: 9.8 mg/dL (ref 8.4–10.5)
Creatinine, Ser: 0.62 mg/dL (ref 0.50–1.10)
GFR calc Af Amer: >90 mL/min (ref >90)

## 2012-04-13 LAB — POCT I-STAT TROPONIN I: Troponin i, poc: 0 ng/mL (ref 0.00–0.08)

## 2012-04-13 NOTE — ED Notes (Signed)
Pt reports for 2 weeks having chest tightness to L side; denies SOB currently--had some earlier; denies n/v/diaphoresis

## 2012-04-13 NOTE — ED Notes (Signed)
EKG completed in triage. Pt put in gown and placed on cardiac monitor and continuous pulse ox.

## 2012-04-16 ENCOUNTER — Ambulatory Visit
Admission: RE | Admit: 2012-04-16 | Discharge: 2012-04-16 | Disposition: A | Discharge status: Home / Self Care | Payer: Federal, State, Local not specified - PPO | Source: Ambulatory Visit | Attending: General Surgery | Admitting: General Surgery

## 2012-04-16 DIAGNOSIS — R229 Localized swelling, mass and lump, unspecified: Secondary | ICD-10-CM

## 2012-04-16 MED ORDER — GADOBENATE DIMEGLUMINE 529 MG/ML IV SOLN
10.0000 mL | Freq: Once | INTRAVENOUS | Status: AC | PRN
  Administered 2012-04-16: 10 mL via INTRAVENOUS

## 2012-04-16 NOTE — ED Provider Notes (Signed)
History     CSN: [id number]  Arrival date & time 04/13/12  1659   First MD Initiated Contact with Patient 04/13/12 1917      Chief Complaint  Patient presents with  . Chest Pain     HPI Pt reports for 2 weeks having chest tightness to L side; denies SOB currently--had some earlier; denies n/v/diaphoresis  Past Medical History  Diagnosis Date  . CAD (coronary artery disease)     Remote PCI to RCA in 2001, stent to the RCA in 2002, with last cath in 2010 with placement of two overlapping drug eluting  stents to the RCA  . Cutaneous lupus erythematosus   . Anxiety   . Depression   . Noncompliance   . PVD (peripheral vascular disease)     MILD  . H/O: substance abuse   . Hyperlipidemia   . Myocardial infarction     multiple - with stent placement  . Bence Jones proteinuria 02/29/2012    Random urine "M spike" 7.1% 02/13/12    Past Surgical History  Procedure Date  . Abdominal hysterectomy     1999  . Right knee surgery   . Angioplasty   . Coronary angioplasty with stent placement     Family History  Problem Relation Age of Onset  . Hypertension Mother   . Lung cancer Mother   . Heart disease Father   . Hypertension Father   . Stroke Father   . Heart failure Father     History  Substance Use Topics  . Smoking status: Current Every Day Smoker -- 0.5 packs/day    Types: Cigarettes  . Smokeless tobacco: Never Used  . Alcohol Use: 4.2 oz/week    7 Cans of beer per week    OB History    Grav Para Term Preterm Abortions TAB SAB Ect Mult Living                  Review of Systems  All other systems reviewed and are negative.    Allergies  Review of patient's allergies indicates no known allergies.  Home Medications   Current Outpatient Rx  Name Route Sig Dispense Refill  . ASPIRIN 81 MG PO TABS Oral Take 81 mg by mouth daily.      Marland Kitchen EZETIMIBE-SIMVASTATIN 10-40 MG PO TABS Oral Take 1 tablet by mouth at bedtime.    Marland Kitchen HYDROXYCHLOROQUINE SULFATE 200 MG  PO TABS Oral Take 200 mg by mouth daily.     Marland Kitchen METOPROLOL SUCCINATE ER 25 MG PO TB24 Oral Take 25 mg by mouth at bedtime.     Marland Kitchen NITROGLYCERIN 0.4 MG SL SUBL Sublingual Place 0.4 mg under the tongue every 5 (five) minutes as needed. For chest pain    . ZOLPIDEM TARTRATE 10 MG PO TABS Oral Take 10 mg by mouth at bedtime as needed. For sleep    . AMOXICILLIN 500 MG PO CAPS Oral Take 2,000 mg by mouth once as needed. Before dental visit on 10/4      BP 138/86  Pulse 57  Temp 98.1 F (36.7 C) (Oral)  Resp 17  SpO2 100%  Physical Exam  Nursing note and vitals reviewed. Constitutional: She is oriented to person, place, and time. She appears well-developed. No distress.  HENT:  Head: Normocephalic and atraumatic.  Eyes: Pupils are equal, round, and reactive to light.  Neck: Normal range of motion.  Cardiovascular: Normal rate and intact distal pulses.   Pulmonary/Chest: No respiratory distress.  Abdominal: Normal appearance. She exhibits no distension.  Musculoskeletal: Normal range of motion.  Neurological: She is alert and oriented to person, place, and time. No cranial nerve deficit.  Skin: Skin is warm and dry. No rash noted.  Psychiatric: She has a normal mood and affect. Her behavior is normal.    ED Course  Procedures (including critical care time)  Labs Reviewed  BASIC METABOLIC PANEL - Abnormal; Notable for the following:    Glucose, Bld 114 (*)     All other components within normal limits  CBC  POCT I-STAT TROPONIN I  LAB REPORT - SCANNED   Mr Thoracic Spine W Wo Contrast  04/16/2012  *RADIOLOGY REPORT*  Clinical Data: Back pain radiating to the left chest.  Left-sided mass.  MRI THORACIC SPINE WITHOUT AND WITH CONTRAST  Technique:  Multiplanar and multiecho pulse sequences of the thoracic spine were obtained without and with intravenous contrast.  Contrast: 10mL MULTIHANCE GADOBENATE DIMEGLUMINE 529 MG/ML IV SOLN  Comparison: Radiography 04/13/2012.  Radiography  05/03/2010.  CT chest 09/04/2005.  Findings: There is curvature convex to the left in the upper thoracic region and to the right in the mid to lower thoracic region.  The patient does not show evidence of degenerative disc disease.  No disc space narrowing.  No osteophytes encroaching upon the neural spaces.  No herniated disc material.  No bony edema. Ample subarachnoid space surrounds the spinal cord throughout the region.  No primary cord lesion is seen.  There was clinical concern about a soft tissue mass lesion dorsal to the left scapula.  This study confirms the presence of an ovoid lesion measuring 9 x 19 x 15 mm apparently associated with the fascial layer between the subcutaneous fat and the deep fat of the chest wall.  The lesion shows contrast enhancement.  There is some mild edema and enhancement emanating out from the lesion.  The lesion appears separated from the underlying chest wall musculature and does not show muscle invasion.  This could be a mesenchymal neoplasm or could be inflammatory mass.  The findings are nonspecific.  IMPRESSION: Curvature of the spine but without evidence of degenerative change or neural compression.  9 x 19 x 15 mm ovoid mass-like lesion within the superficial fat dorsal to the scapular region on the left, without evidence of deep or muscular invasion.  Mild surrounding edema / enhancement.  The differential diagnosis is that of mesenchymal neoplasm versus inflammatory mass.   Original Report Authenticated By: Thomasenia Sales, M.D.      1. Chest wall pain   2. Single skin nodule       MDM          Nelia Shi, MD 04/16/12 1221

## 2012-04-17 ENCOUNTER — Ambulatory Visit: Payer: Federal, State, Local not specified - PPO | Admitting: Physician Assistant

## 2012-04-18 ENCOUNTER — Encounter: Payer: Self-pay | Admitting: Nurse Practitioner

## 2012-04-18 ENCOUNTER — Ambulatory Visit (INDEPENDENT_AMBULATORY_CARE_PROVIDER_SITE_OTHER): Payer: Federal, State, Local not specified - PPO | Admitting: Nurse Practitioner

## 2012-04-18 VITALS — BP 100/62 | HR 52 | Ht 59.0 in | Wt 108.8 lb

## 2012-04-18 DIAGNOSIS — R0789 Other chest pain: Secondary | ICD-10-CM

## 2012-04-18 DIAGNOSIS — Z01810 Encounter for preprocedural cardiovascular examination: Secondary | ICD-10-CM

## 2012-04-18 MED ORDER — ESCITALOPRAM OXALATE 20 MG PO TABS: 20.0000 mg | ORAL_TABLET | Freq: Every day | ORAL | Status: DC

## 2012-04-18 NOTE — Progress Notes (Signed)
[name] Date of Birth: Aug 19, 1955 Medical Record #[id number]  History of Present Illness: Deshaun is seen back today for a pre op visit. She is seen for Dr. Clifton James. She is a former patient of Dr. Ronnald Nian that I know quite well. She has known CAD with prior PCI of the RCA followed by stenting of the RCA in 2002. Last cath was in 2010 and showed that her RCA was subtotally occluded distally. She then had two overlapping drug eluting stents placed in the distal RCA with an overall satisfactory result obtained. She has a total of 3 stents in the RCA. LV function was normal. She does have moderate disease in the LCX with minimal in the LAD. She has been managed medically Her other issues include tobacco abuse, cutaneous lupus, anxiety, depression, HLD, mild PVD and ongoing tobacco abuse.   She comes in today. She is here alone. She is pretty stressed out. Her sister has moved in with her upon her getting out of prison. Shaquela had already raised her sister's two kids and got them out of her house. Her sister had some type of felony charge and will probably not be able to work. She is quite stressed and says she is about "to lose her mind". She has been having chest tightness over the last week or so. Does not really feel like her prior chest pain syndrome. Not using NTG. She has also been found to have a mass under her left shoulder that needs removal. She will be needing pre op clearance. She stays pretty active. Not short of breath. Still smoking.   Current Outpatient Prescriptions on File Prior to Visit  Medication Sig Dispense Refill  . amoxicillin (AMOXIL) 500 MG capsule Take 2,000 mg by mouth once as needed. Before dental visit on 10/4      . aspirin 81 MG tablet Take 81 mg by mouth daily.        Marland Kitchen ezetimibe-simvastatin (VYTORIN) 10-40 MG per tablet Take 1 tablet by mouth at bedtime.      . hydroxychloroquine (PLAQUENIL) 200 MG tablet Take 200 mg by mouth daily.       . metoprolol  succinate (TOPROL-XL) 25 MG 24 hr tablet Take 25 mg by mouth at bedtime.       . nitroGLYCERIN (NITROSTAT) 0.4 MG SL tablet Place 0.4 mg under the tongue every 5 (five) minutes as needed. For chest pain      . zolpidem (AMBIEN) 10 MG tablet Take 10 mg by mouth at bedtime as needed. For sleep      . escitalopram (LEXAPRO) 20 MG tablet Take 1 tablet (20 mg total) by mouth daily.  30 tablet  6    No Known Allergies  Past Medical History  Diagnosis Date  . CAD (coronary artery disease)     Remote PCI to RCA in 2001, stent to the RCA in 2002, with last cath in 2010 with placement of two overlapping drug eluting  stents to the RCA  . Cutaneous lupus erythematosus   . Anxiety   . Depression   . Noncompliance   . PVD (peripheral vascular disease)     MILD  . H/O: substance abuse   . Hyperlipidemia   . Myocardial infarction     multiple - with stent placement  . Bence Jones proteinuria 02/29/2012    Random urine "M spike" 7.1% 02/13/12    Past Surgical History  Procedure Date  . Abdominal hysterectomy     1999  .  Right knee surgery   . Angioplasty   . Coronary angioplasty with stent placement     History  Smoking status  . Current Every Day Smoker -- 0.5 packs/day  . Types: Cigarettes  Smokeless tobacco  . Never Used    History  Alcohol Use  . 4.2 oz/week  . 7 Cans of beer per week    Family History  Problem Relation Age of Onset  . Hypertension Mother   . Lung cancer Mother   . Heart disease Father   . Hypertension Father   . Stroke Father   . Heart failure Father     Review of Systems: The review of systems is per the HPI.  All other systems were reviewed and are negative.  Physical Exam: BP 100/62  Pulse 52  Ht 4\' 11"  (1.499 m)  Wt 108 lb 12.8 oz (49.351 kg)  BMI 21.97 kg/m2 Patient is very pleasant and in no acute distress. Skin is warm and dry. Color is normal.  HEENT is unremarkable. Normocephalic/atraumatic. PERRL. Sclera are nonicteric. Neck is supple.  No masses. No JVD. Lungs are clear. Cardiac exam shows a regular rate and rhythm. Abdomen is soft. Extremities are without edema. Gait and ROM are intact. No gross neurologic deficits noted.  LABORATORY DATA:   EKG from 10/1 showed sinus rhythm with nonspecific ST/T wave changes.     Chemistry      Component Value Date/Time   NA 140 04/13/2012 1709   NA 141 03/26/2012 1004   K 3.7 04/13/2012 1709   K 4.4 03/26/2012 1004   CL 103 04/13/2012 1709   CL 107 03/26/2012 1004   CO2 25 04/13/2012 1709   CO2 22 03/26/2012 1004   BUN 16 04/13/2012 1709   BUN 17.0 03/26/2012 1004   CREATININE 0.62 04/13/2012 1709   CREATININE 0.8 03/26/2012 1004      Component Value Date/Time   CALCIUM 9.8 04/13/2012 1709   CALCIUM 9.6 03/26/2012 1004   ALKPHOS 62 03/26/2012 1004   ALKPHOS 55 12/04/2010 0927   AST 31 03/26/2012 1004   AST 26 12/04/2010 0927   ALT 27 03/26/2012 1004   ALT 24 12/04/2010 0927   BILITOT 0.30 03/26/2012 1004   BILITOT 0.4 12/04/2010 0927     Lab Results  Component Value Date   CHOL 123 12/04/2010   HDL 49.80 12/04/2010   LDLCALC 64 12/04/2010   TRIG 45.0 12/04/2010   CHOLHDL 2 12/04/2010   MRI IMPRESSION: Curvature of the spine but without evidence of degenerative change or neural compression.  9 x 19 x 15 mm ovoid mass-like lesion within the superficial fat dorsal to the scapular region on the left, without evidence of deep or muscular invasion. Mild surrounding edema / enhancement. The differential diagnosis is that of mesenchymal neoplasm versus inflammatory mass.   Original Report Authenticated By: Thomasenia Sales, M.D.        Assessment / Plan: 1. Pre op visit - needs mass/nodule removed from under left shoulder.   2. CAD - has total 3 stents in the RCA. Reports chest tightness over the last week or two. She does not feel like it is like her past chest pain syndrome but she has presented differently in the past. Will obtain Myoview early next week with need for pre op  clearance. She is quite stressed. I have restarted her Lexapro. She has been on this in the past.   3. Ongoing substance abuse - she is not ready to stop  at this time.  We will see how the stress test turns out. No other changes in her medicines today. NTG prn.   Patient is agreeable to this plan and will call if any problems develop in the interim.    4. HLD

## 2012-04-18 NOTE — Patient Instructions (Addendum)
I am restarting your Lexapro.   We are going to get a stress test (lexiscan) early next week  Stay on your current medicines.

## 2012-04-21 ENCOUNTER — Telehealth: Payer: Self-pay | Admitting: Cardiovascular Disease

## 2012-04-21 ENCOUNTER — Encounter: Payer: Self-pay | Admitting: Cardiovascular Disease

## 2012-04-21 ENCOUNTER — Ambulatory Visit (HOSPITAL_COMMUNITY): Payer: Federal, State, Local not specified - PPO | Attending: Cardiology | Admitting: Radiology

## 2012-04-21 VITALS — BP 141/78 | Ht 59.0 in | Wt 106.0 lb

## 2012-04-21 DIAGNOSIS — R0789 Other chest pain: Secondary | ICD-10-CM

## 2012-04-21 DIAGNOSIS — Z8249 Family history of ischemic heart disease and other diseases of the circulatory system: Secondary | ICD-10-CM | POA: Insufficient documentation

## 2012-04-21 DIAGNOSIS — F17200 Nicotine dependence, unspecified, uncomplicated: Secondary | ICD-10-CM | POA: Insufficient documentation

## 2012-04-21 DIAGNOSIS — R0602 Shortness of breath: Secondary | ICD-10-CM

## 2012-04-21 DIAGNOSIS — Z01810 Encounter for preprocedural cardiovascular examination: Secondary | ICD-10-CM

## 2012-04-21 DIAGNOSIS — R079 Chest pain, unspecified: Secondary | ICD-10-CM

## 2012-04-21 DIAGNOSIS — I739 Peripheral vascular disease, unspecified: Secondary | ICD-10-CM | POA: Insufficient documentation

## 2012-04-21 DIAGNOSIS — I2510 Atherosclerotic heart disease of native coronary artery without angina pectoris: Secondary | ICD-10-CM

## 2012-04-21 MED ORDER — REGADENOSON 0.4 MG/5ML IV SOLN
0.4000 mg | Freq: Once | INTRAVENOUS | Status: AC
  Administered 2012-04-21: 0.4 mg via INTRAVENOUS

## 2012-04-21 MED ORDER — TECHNETIUM TC 99M SESTAMIBI GENERIC - CARDIOLITE
11.0000 | Freq: Once | INTRAVENOUS | Status: AC | PRN
  Administered 2012-04-21: 11 via INTRAVENOUS

## 2012-04-21 MED ORDER — AMINOPHYLLINE 25 MG/ML IV SOLN
75.0000 mg | Freq: Once | INTRAVENOUS | Status: AC
  Administered 2012-04-21: 75 mg via INTRAVENOUS

## 2012-04-21 MED ORDER — TECHNETIUM TC 99M SESTAMIBI GENERIC - CARDIOLITE
33.0000 | Freq: Once | INTRAVENOUS | Status: AC | PRN
  Administered 2012-04-21: 33 via INTRAVENOUS

## 2012-04-21 NOTE — Telephone Encounter (Signed)
New Problem:    Patient called in because she is experiencing a burning sensation in her chest.  Patient experienced in her chest yesterday as well.  Patient has an appointment for a Lexiscan today.  Please call back.

## 2012-04-21 NOTE — Progress Notes (Signed)
[facility] 3 NUCLEAR MED [address] [id number] [place] [address] [phone]  Cardiology Nuclear Med Study  [name] is a 56 y.o. female     MRN : [id number]     DOB: 11-21-1955  Procedure Date: 04/21/2012  Nuclear Med Background Indication for Stress Test:  Evaluation for Ischemia, Pending Surgical Clearance: Mass Removal from Left shoulder area, Stent Patency and PTCA Patency History:  '01 Angioplasty, '02 Stents: RCA 04/10/06: MPS: EF: 71% (Adenosine) '10 Heart Cath  Cardiac Risk Factors: Family History - CAD, Lipids, PVD and Smoker  Symptoms:  Chest Tightness   Nuclear Pre-Procedure Caffeine/Decaff Intake:  None NPO After: 8:00am   Lungs:  clear O2 Sat: 97% on room air. IV 0.9% NS with Angio Cath:  18g  IV Site: R Antecubital  IV Started by:  Stanton Kidney, EMT-P  Chest Size (in):  34 Cup Size: B  Height: 4\' 11"  (1.499 m)  Weight:  106 lb (48.081 kg)  BMI:  Body mass index is 21.41 kg/(m^2). Tech Comments:  meds as directed. This patient was very symptomatic so she was reversed with Aminophylline 75 mg IV and all symptoms were resolved completely.    Nuclear Med Study 1 or 2 day study: 1 day  Stress Test Type:  Eugenie Birks  Reading MD: Willa Rough, MD  Order Authorizing Provider:  C.McAlhany MD  Resting Radionuclide: Technetium 34m Sestamibi  Resting Radionuclide Dose: 11.0 mCi   Stress Radionuclide:  Technetium 75m Sestamibi  Stress Radionuclide Dose: 33.0 mCi           Stress Protocol Rest HR: 49 Stress HR: 90  Rest BP: 141/78 Stress BP: 164/83  Exercise Time (min): n/a METS: n/a   Predicted Max HR: 167 bpm % Max HR: 53.89 bpm Rate Pressure Product: 24401   Dose of Adenosine (mg):  n/a Dose of Lexiscan: 0.4 mg  Dose of Atropine (mg): n/a Dose of Dobutamine: n/a mcg/kg/min (at max HR)  Stress Test Technologist: Milana Na, EMT-P  Nuclear Technologist:  Domenic Polite, CNMT     Rest Procedure:  Myocardial perfusion  imaging was performed at rest 45 minutes following the intravenous administration of Technetium 66m Sestamibi. Rest ECG: Sinus Bradycardia  Stress Procedure:  The patient received IV Lexiscan 0.4 mg over 15-seconds.  Technetium 16m Sestamibi injected at 30-seconds.  There were no significant changes, sob, abdominal pain, chest, and throat thightness with Lexiscan.  Quantitative spect images were obtained after a 45 minute delay. Stress ECG: No significant change from baseline ECG  QPS Raw Data Images:  Normal; no motion artifact; normal heart/lung ratio. Stress Images:  There is a small area of decreased activity affecting the base inferolateral segment in the mid inferolateral segment. The degree of photon reduction is mild. Rest Images:  There is very slight decreased photon activity at the base of the inferolateral wall. Subtraction (SDS):  There is mild reversibility at the base of the inferior wall. Transient Ischemic Dilatation (Normal <1.22):  1.14 Lung/Heart Ratio (Normal <0.45):  0.32  Quantitative Gated Spect Images QGS EDV:  69 ml QGS ESV:  32 ml  Impression Exercise Capacity:  Adenosine study with no exercise. BP Response:  Normal blood pressure response. Clinical Symptoms:  shortness of breath ECG Impression:  No significant ST segment change suggestive of ischemia. Comparison with Prior Nuclear Study: No images to compare  Overall Impression:  The findings suggest a small area of mild ischemia at the base inferolateral and mid inferolateral segments.  LV Ejection Fraction: 53%.  LV Wall Motion:  Normal Wall Motion.  Willa Rough, MD

## 2012-04-21 NOTE — Telephone Encounter (Signed)
Spoke with pt who states yesterday after work and while driving home she had a "burning in her chest up in the top part"  She laid down and slept which resolved the discomfort.  Reports that it felt like at the end of a stress test when they use the chemical.  She is not having the pain today.  She is scheduled for a myoview for surgical clearance today.  She will keep that appt.

## 2012-04-22 ENCOUNTER — Telehealth (HOSPITAL_COMMUNITY): Payer: Self-pay | Admitting: Radiology

## 2012-04-22 NOTE — Telephone Encounter (Signed)
Patient called [name] today with throat tightness.  Patient had nuclear stress test 04/21/12. She received Lexiscan and developed throat tightness with infusion.  Aminophylline 75 mg given and symptoms relieved. I informed her that the symptoms were not related to the Lexiscan that she received yesterday.  I instructed patient to try a NTG SL. If not relieved after 3 doses then go to  ER.  I told patient that I would send a note in reference to this and you would be contacting her with the nuclear stress test results.  Cindy Agustin Swatek,RN

## 2012-04-23 ENCOUNTER — Encounter (HOSPITAL_COMMUNITY): Payer: Self-pay | Admitting: *Deleted

## 2012-04-23 ENCOUNTER — Other Ambulatory Visit: Payer: Federal, State, Local not specified - PPO

## 2012-04-23 ENCOUNTER — Ambulatory Visit (HOSPITAL_COMMUNITY): Admit: 2012-04-23 | Payer: Self-pay | Admitting: Cardiovascular Disease

## 2012-04-23 ENCOUNTER — Other Ambulatory Visit: Payer: Self-pay | Admitting: Nurse Practitioner

## 2012-04-23 ENCOUNTER — Telehealth: Payer: Self-pay | Admitting: Cardiovascular Disease

## 2012-04-23 ENCOUNTER — Encounter (HOSPITAL_COMMUNITY)
Admission: EM | Disposition: A | Discharge status: Home / Self Care | Payer: Self-pay | Source: Home / Self Care | Attending: Cardiovascular Disease

## 2012-04-23 ENCOUNTER — Inpatient Hospital Stay (HOSPITAL_COMMUNITY)
Admission: EM | Admit: 2012-04-23 | Discharge: 2012-04-24 | DRG: 852 | Disposition: A | Discharge status: Home / Self Care | Payer: Federal, State, Local not specified - PPO | Attending: Cardiovascular Disease | Admitting: Cardiovascular Disease

## 2012-04-23 ENCOUNTER — Other Ambulatory Visit: Payer: Self-pay

## 2012-04-23 ENCOUNTER — Encounter: Payer: Self-pay | Admitting: *Deleted

## 2012-04-23 DIAGNOSIS — R739 Hyperglycemia, unspecified: Secondary | ICD-10-CM | POA: Diagnosis present

## 2012-04-23 DIAGNOSIS — Z90710 Acquired absence of both cervix and uterus: Secondary | ICD-10-CM

## 2012-04-23 DIAGNOSIS — I2510 Atherosclerotic heart disease of native coronary artery without angina pectoris: Secondary | ICD-10-CM

## 2012-04-23 DIAGNOSIS — Z8249 Family history of ischemic heart disease and other diseases of the circulatory system: Secondary | ICD-10-CM

## 2012-04-23 DIAGNOSIS — Z720 Tobacco use: Secondary | ICD-10-CM

## 2012-04-23 DIAGNOSIS — F3289 Other specified depressive episodes: Secondary | ICD-10-CM | POA: Diagnosis present

## 2012-04-23 DIAGNOSIS — Z7982 Long term (current) use of aspirin: Secondary | ICD-10-CM

## 2012-04-23 DIAGNOSIS — Z01810 Encounter for preprocedural cardiovascular examination: Secondary | ICD-10-CM

## 2012-04-23 DIAGNOSIS — I10 Essential (primary) hypertension: Secondary | ICD-10-CM

## 2012-04-23 DIAGNOSIS — Z823 Family history of stroke: Secondary | ICD-10-CM

## 2012-04-23 DIAGNOSIS — D649 Anemia, unspecified: Secondary | ICD-10-CM | POA: Diagnosis present

## 2012-04-23 DIAGNOSIS — R7309 Other abnormal glucose: Secondary | ICD-10-CM | POA: Diagnosis present

## 2012-04-23 DIAGNOSIS — R079 Chest pain, unspecified: Secondary | ICD-10-CM | POA: Diagnosis present

## 2012-04-23 DIAGNOSIS — E785 Hyperlipidemia, unspecified: Secondary | ICD-10-CM

## 2012-04-23 DIAGNOSIS — E876 Hypokalemia: Secondary | ICD-10-CM

## 2012-04-23 DIAGNOSIS — I200 Unstable angina: Secondary | ICD-10-CM

## 2012-04-23 DIAGNOSIS — F411 Generalized anxiety disorder: Secondary | ICD-10-CM | POA: Diagnosis present

## 2012-04-23 DIAGNOSIS — Z955 Presence of coronary angioplasty implant and graft: Secondary | ICD-10-CM

## 2012-04-23 DIAGNOSIS — Z9861 Coronary angioplasty status: Secondary | ICD-10-CM

## 2012-04-23 DIAGNOSIS — F329 Major depressive disorder, single episode, unspecified: Secondary | ICD-10-CM | POA: Diagnosis present

## 2012-04-23 DIAGNOSIS — L930 Discoid lupus erythematosus: Secondary | ICD-10-CM | POA: Diagnosis present

## 2012-04-23 DIAGNOSIS — R229 Localized swelling, mass and lump, unspecified: Secondary | ICD-10-CM | POA: Diagnosis present

## 2012-04-23 HISTORY — PX: LEFT HEART CATHETERIZATION WITH CORONARY ANGIOGRAM: SHX5451

## 2012-04-23 HISTORY — DX: Angina pectoris, unspecified: I20.9

## 2012-04-23 LAB — CBC
HCT: 35.1 % — ABNORMAL LOW (ref 36.0–46.0)
MCHC: 34.2 g/dL (ref 30.0–36.0)
MCV: 90.7 fL (ref 78.0–100.0)
RDW: 14.5 % (ref 11.5–15.5)

## 2012-04-23 LAB — BASIC METABOLIC PANEL
BUN: 13 mg/dL (ref 6–23)
CO2: 24 mEq/L (ref 19–32)
Chloride: 106 mEq/L (ref 96–112)
Creatinine, Ser: 0.57 mg/dL (ref 0.50–1.10)
Glucose, Bld: 168 mg/dL — ABNORMAL HIGH (ref 70–99)

## 2012-04-23 LAB — CK TOTAL AND CKMB (NOT AT ARMC)
CK, MB: 1.5 ng/mL (ref 0.3–4.0)
Relative Index: INVALID (ref 0.0–2.5)

## 2012-04-23 LAB — POCT ACTIVATED CLOTTING TIME: Activated Clotting Time: 374 s

## 2012-04-23 LAB — APTT: aPTT: 30 seconds (ref 24–37)

## 2012-04-23 SURGERY — LEFT HEART CATHETERIZATION WITH CORONARY ANGIOGRAM
Anesthesia: LOCAL

## 2012-04-23 MED ORDER — ACETAMINOPHEN 325 MG PO TABS: 650.0000 mg | ORAL_TABLET | ORAL | Status: DC | PRN

## 2012-04-23 MED ORDER — MIDAZOLAM HCL 2 MG/2ML IJ SOLN
INTRAMUSCULAR | Status: AC
  Filled 2012-04-23: qty 2

## 2012-04-23 MED ORDER — BIVALIRUDIN 250 MG IV SOLR
INTRAVENOUS | Status: AC
  Filled 2012-04-23: qty 250

## 2012-04-23 MED ORDER — ONDANSETRON HCL 4 MG/2ML IJ SOLN: 4.0000 mg | Freq: Four times a day (QID) | INTRAMUSCULAR | Status: DC | PRN

## 2012-04-23 MED ORDER — NITROGLYCERIN 0.2 MG/ML ON CALL CATH LAB
INTRAVENOUS | Status: AC
  Filled 2012-04-23: qty 1

## 2012-04-23 MED ORDER — OXYCODONE-ACETAMINOPHEN 5-325 MG PO TABS: 1.0000 | ORAL_TABLET | ORAL | Status: DC | PRN

## 2012-04-23 MED ORDER — NITROGLYCERIN 0.4 MG SL SUBL: 0.4000 mg | SUBLINGUAL_TABLET | SUBLINGUAL | Status: DC | PRN

## 2012-04-23 MED ORDER — HYDROXYCHLOROQUINE SULFATE 200 MG PO TABS
200.0000 mg | ORAL_TABLET | Freq: Every day | ORAL | Status: DC
Start: 2012-04-23 — End: 2012-04-24
  Administered 2012-04-23: 21:00:00 200 mg via ORAL
  Filled 2012-04-23 (×2): qty 1

## 2012-04-23 MED ORDER — MORPHINE SULFATE 2 MG/ML IJ SOLN
2.0000 mg | INTRAMUSCULAR | Status: DC | PRN
  Administered 2012-04-23: 2 mg via INTRAVENOUS
  Filled 2012-04-23: qty 1

## 2012-04-23 MED ORDER — ZOLPIDEM TARTRATE 5 MG PO TABS: 10.0000 mg | ORAL_TABLET | Freq: Every evening | ORAL | Status: DC | PRN

## 2012-04-23 MED ORDER — METOPROLOL SUCCINATE ER 25 MG PO TB24
25.0000 mg | ORAL_TABLET | Freq: Every day | ORAL | Status: DC
  Administered 2012-04-23: 25 mg via ORAL
  Filled 2012-04-23 (×2): qty 1

## 2012-04-23 MED ORDER — SODIUM CHLORIDE 0.9 % IV SOLN: 250.0000 mL | INTRAVENOUS | Status: DC | PRN

## 2012-04-23 MED ORDER — SODIUM CHLORIDE 0.9 % IJ SOLN: 3.0000 mL | Freq: Two times a day (BID) | INTRAMUSCULAR | Status: DC

## 2012-04-23 MED ORDER — SODIUM CHLORIDE 0.9 % IV SOLN
INTRAVENOUS | Status: AC
  Administered 2012-04-23: 19:00:00 via INTRAVENOUS

## 2012-04-23 MED ORDER — ALPRAZOLAM 0.25 MG PO TABS: 0.2500 mg | ORAL_TABLET | Freq: Two times a day (BID) | ORAL | Status: DC | PRN

## 2012-04-23 MED ORDER — CLOPIDOGREL BISULFATE 300 MG PO TABS
ORAL_TABLET | ORAL | Status: AC
  Filled 2012-04-23: qty 2

## 2012-04-23 MED ORDER — DIAZEPAM 5 MG PO TABS: 5.0000 mg | ORAL_TABLET | ORAL | Status: DC

## 2012-04-23 MED ORDER — ASPIRIN 81 MG PO CHEW: 324.0000 mg | CHEWABLE_TABLET | ORAL | Status: DC

## 2012-04-23 MED ORDER — HEPARIN (PORCINE) IN NACL 2-0.9 UNIT/ML-% IJ SOLN
INTRAMUSCULAR | Status: AC
  Filled 2012-04-23: qty 1000

## 2012-04-23 MED ORDER — ASPIRIN 81 MG PO CHEW
81.0000 mg | CHEWABLE_TABLET | Freq: Every day | ORAL | Status: DC
  Administered 2012-04-24: 09:00:00 81 mg via ORAL
  Filled 2012-04-23: qty 1

## 2012-04-23 MED ORDER — ZOLPIDEM TARTRATE 5 MG PO TABS
5.0000 mg | ORAL_TABLET | Freq: Every evening | ORAL | Status: DC | PRN
  Administered 2012-04-23: 5 mg via ORAL
  Filled 2012-04-23: qty 1

## 2012-04-23 MED ORDER — ZOLPIDEM TARTRATE 5 MG PO TABS
10.0000 mg | ORAL_TABLET | Freq: Every evening | ORAL | Status: DC | PRN
  Administered 2012-04-23: 5 mg via ORAL

## 2012-04-23 MED ORDER — EZETIMIBE-SIMVASTATIN 10-40 MG PO TABS
1.0000 | ORAL_TABLET | Freq: Every day | ORAL | Status: DC
  Administered 2012-04-23: 21:00:00 1 via ORAL
  Filled 2012-04-23 (×2): qty 1

## 2012-04-23 MED ORDER — LIDOCAINE HCL (PF) 1 % IJ SOLN
INTRAMUSCULAR | Status: AC
  Filled 2012-04-23: qty 30

## 2012-04-23 MED ORDER — CLOPIDOGREL BISULFATE 75 MG PO TABS
75.0000 mg | ORAL_TABLET | Freq: Every day | ORAL | Status: DC
  Administered 2012-04-24: 75 mg via ORAL
  Filled 2012-04-23: qty 1

## 2012-04-23 MED ORDER — SODIUM CHLORIDE 0.9 % IJ SOLN: 3.0000 mL | INTRAMUSCULAR | Status: DC | PRN

## 2012-04-23 MED ORDER — SODIUM CHLORIDE 0.9 % IV SOLN
INTRAVENOUS | Status: DC
  Administered 2012-04-23: 50 mL/h via INTRAVENOUS

## 2012-04-23 MED ORDER — FENTANYL CITRATE 0.05 MG/ML IJ SOLN
INTRAMUSCULAR | Status: AC
  Filled 2012-04-23: qty 2

## 2012-04-23 NOTE — Interval H&P Note (Signed)
History and Physical Interval Note:  04/23/2012 4:46 PM  [name]  has presented today for cardiac cath with the diagnosis of Chest pain  The various methods of treatment have been discussed with the patient and family. After consideration of risks, benefits and other options for treatment, the patient has consented to  Procedure(s) (LRB) with comments: LEFT HEART CATHETERIZATION WITH CORONARY ANGIOGRAM (N/A) as a surgical intervention .  The patient's history has been reviewed, patient examined, no change in status, stable for surgery.  I have reviewed the patient's chart and labs.  Questions were answered to the patient's satisfaction.     Milon Dethloff

## 2012-04-23 NOTE — Telephone Encounter (Signed)
Left message to call back.  Per Dr. Clifton James pt needs cath based on stress test results.  Will try and arrange for tomorrow with Dr. Excell Seltzer.

## 2012-04-23 NOTE — Telephone Encounter (Signed)
New problem:  Would like to be set up for lab work.

## 2012-04-23 NOTE — Telephone Encounter (Signed)
Spoke with pt and reviewed stress test results and recommendation for cath.  She would like to proceed.  Cath scheduled for April 24, 2012 at 11:30 with Dr. Excell Seltzer in [facility].  Pt will come into office today for lab work and I will review instructions with her at that time.

## 2012-04-23 NOTE — Telephone Encounter (Signed)
Admitted today. cdm

## 2012-04-23 NOTE — Progress Notes (Signed)
Site area: right groin  Site Prior to Removal:  Level 0  Pressure Applied For 20 MINUTES    Minutes Beginning at 2005  Manual:   yes  Patient Status During Pull:  Tolerated well, moderately anxious but calmed effectively after IV morphine and relaxation/visualization assisted by RN.   Post Pull Groin Site:  Level 0  Post Pull Instructions Given:  yes  Post Pull Pulses Present:  yes  Palpable but weak  Dressing Applied:  yes, pressure drsg  4x4's/tape  Comments:  See freq vs.  Update given to primary RN Donna Christen.

## 2012-04-23 NOTE — H&P (Signed)
[name] Date of Birth: Oct 03, 1955 Medical Record #[id number]  History of Present Illness: [name] is seen today in the office. She was here to pick up pre cath orders and have labs drawn in light of upcoming cardiac catheterization tomorrow. She began having chest discomfort in the parking lot with shortness of breath.   She has known CAD. She is a former patient of Dr. Ronnald Nian and is establishing her care here with Dr. Clifton James. She has known CAD with prior PCI of the RCA followed by stenting of the RCA i n2002. Her last cath was in 2010 and showed that her RCA was subtotally occluded distally. She then had two overlapping drug eluting stents placed in the distal RCA with an overall satisfactory result obtained. She has a total of 3 stents in the RCA. LV function has been normal. She has moderate disease in the LCX with minimal LAD disease. Her other problems include ongoing tobacco/marijuana use, cutaneous lupus, anxiety, depression, HLD, stress/depression and PVD.   I saw her earlier this week for a pre op clearance. She has a mass under her left shoulder that needs to be removed. Pre op clearance was asked for. She endorsed an approximate one week history of chest pain, midsternal and up to the jaw, that she felt was more like stress. Her sister has just moved in with her upon her getting out of prison. She also has had a pain that goes around the left breast which is felt to be related to this mass. She had not been using NTG. Stress Myoview was ordered and this was performed yesterday. This was abnormal with a small area of mild ischemia at the base inferolateral and mid inferolateral segments. EF was 53%. Cardiac cath was then recommended. She was on her way here to pick up her orders and have her labs drawn. She had recurrent chest pain with shortness of breath in the parking lot that worsened with walking into the building and up the elevator. She became very uncomfortable and she was  taken from the lobby by wheelchair to an exam room for further evaluation.   No current facility-administered medications on file prior to encounter.   Current Outpatient Prescriptions on File Prior to Encounter  Medication Sig Dispense Refill  . amoxicillin (AMOXIL) 500 MG capsule Take 2,000 mg by mouth once as needed. Before dental visit on 10/4      . aspirin 81 MG tablet Take 81 mg by mouth daily.        Marland Kitchen escitalopram (LEXAPRO) 20 MG tablet Take 1 tablet (20 mg total) by mouth daily.  30 tablet  6  . ezetimibe-simvastatin (VYTORIN) 10-40 MG per tablet Take 1 tablet by mouth at bedtime.      . hydroxychloroquine (PLAQUENIL) 200 MG tablet Take 200 mg by mouth daily.       . hydrOXYzine (ATARAX/VISTARIL) 10 MG tablet       . metoprolol succinate (TOPROL-XL) 25 MG 24 hr tablet Take 25 mg by mouth at bedtime.       . nitroGLYCERIN (NITROSTAT) 0.4 MG SL tablet Place 0.4 mg under the tongue every 5 (five) minutes as needed. For chest pain      . zolpidem (AMBIEN) 10 MG tablet Take 10 mg by mouth at bedtime as needed. For sleep        No Known Allergies  Past Medical History  Diagnosis Date  . CAD (coronary artery disease)     Remote PCI to RCA  in 2001, stent to the RCA in 2002, with last cath in 2010 with placement of two overlapping drug eluting  stents to the RCA  . Cutaneous lupus erythematosus   . Anxiety   . Depression   . Noncompliance   . PVD (peripheral vascular disease)     MILD  . H/O: substance abuse   . Hyperlipidemia   . Myocardial infarction     multiple - with stent placement  . Bence Jones proteinuria 02/29/2012    Random urine "M spike" 7.1% 02/13/12    Past Surgical History  Procedure Date  . Abdominal hysterectomy     1999  . Right knee surgery   . Angioplasty   . Coronary angioplasty with stent placement     History  Smoking status  . Current Every Day Smoker -- 0.5 packs/day  . Types: Cigarettes  Smokeless tobacco  . Never Used    History    Alcohol Use  . 4.2 oz/week  . 7 Cans of beer per week    Family History  Problem Relation Age of Onset  . Hypertension Mother   . Lung cancer Mother   . Heart disease Father   . Hypertension Father   . Stroke Father   . Heart failure Father     Review of Systems: The review of systems is positive for recent eye exam earlier this morning for her lupus.  All other systems were reviewed and are negative.  Physical Exam: There were no vitals taken for this visit. Blood pressure was initially 110/80 by me. Heart rate 60 and regular.  Patient is quite pleasant. She was quite tearful and tremulous on her arrival to the exam room. She endorsed chest pain. She was crying. Skin is warm and dry. Color is normal.  HEENT is unremarkable. Normocephalic/atraumatic. PERRL. Sclera are nonicteric. Neck is supple. No masses. No JVD. Lungs are clear. Cardiac exam shows a regular rate and rhythm. Abdomen is soft. Extremities are without edema. Gait was not tested. ROM appears intact. No gross neurologic deficits noted.  LABORATORY DATA: EKG shows sinus rhythm. She has inferior and lateral ST/T wave changes. Tracing is reviewed with Dr. Jens Som.    Assessment / Plan:   Chest pain - has known CAD - abnormal Myoview. She presents for her labs and orders and is having active chest pain. She was treated with 4 baby aspirin and NTG sl x 1 with considerable improvement in her symptoms. Blood pressure rose to 140/80. She was subsequently seen with Dr. Jens Som. We are going to admit and proceed on with cardiac catheterization later today with Dr. Clifton James. Dr. Jens Som has reviewed the procedure/risks/benefits with the patient. She is agreeable. EMS was called and the patient was transported to the ER. Dr. Clifton James is aware.   Patient is agreeable to this plan and will call if any problems develop in the interim.    As above, patient seen and examined. Briefly 56 year old female with past medical history of  coronary artery disease and hyperlipidemia with chest pain. Patient recently seen for preoperative evaluation prior to resection of mass on her back. Nuclear study showed an ejection fraction of 53%. There was a small area of ischemia at the base of the inferolateral and mid inferolateral segments. Patient was to have cardiac catheterization. She presented today for laboratories and was having chest pain. Her symptoms improved with nitroglycerin. Plan to admit and proceed with cardiac catheterization. The risks and benefits were discussed and the patient agrees to  proceed. This included but not limited to stroke, myocardial infarction and death. Continue aspirin, statin and beta blocker. Olga Millers

## 2012-04-23 NOTE — CV Procedure (Signed)
Cardiac Catheterization Operative Report  [name] [id number] 10/9/20135:58 PM Krista Blamer, MD  Procedure Performed:  1. Left Heart Catheterization 2. Selective Coronary Angiography 3. Left ventricular angiogram 4. PTCA/bare metal stent x 1 mid Circumflex  Operator: Verne Carrow, MD  Indication:  Unstable angina, known CAD, stress myoview as an outpatient yesterday with lateral ischemia.  Pt had recurrence of chest pain today and came in for admission.                                     Procedure Details: The risks, benefits, complications, treatment options, and expected outcomes were discussed with the patient. The patient and/or family concurred with the proposed plan, giving informed consent. The patient was brought to the cath lab after IV hydration was begun and oral premedication was given. The patient was further sedated with Versed and Fentanyl. The right groin was prepped and draped in the usual manner. Using the modified Seldinger access technique, a 5 French sheath was placed in the right femoral artery. Standard diagnostic catheters were used to perform selective coronary angiography. A pigtail catheter was used to perform a left ventricular angiogram.   I elected to proceed to intervention of the severe stenosis in the mid Circumflex artery. The sheath was upsized to a 6 [place] system. She was given 600 mg po Plavix and was given a bolus of Angiomax.  An Angiomax drip was started. I then engaged the left main with a XB 3.0 guiding catheter. When the ACT was greater than 200, I passed a Cougar IC wire down the Circumflex. I then pre-dilated the stenosis with a 2.5 x 12 mm balloon x 1. There was a considerable amount of wire bias in the vessel due to vessel straightening with wire insertion. I then deployed a 2.5 x 15 mm Vision bare metal stent in the mid Circumflex. This was post-dilated with a 2.75 x 12 mm [place] balloon x 1. The stenosis was taken from 99% down  to 0%. There was excellent flow into the distal vessel. I elected not to cover a diffuse area of 50% stenosis proximally as I needed to place a bare metal stent with her upcoming surgery which will require a shorter course of dual antiplatelet therapy.   There were no immediate complications. The patient was taken to the recovery area in stable condition.   Hemodynamic Findings: Central aortic pressure: 160/87 Left ventricular pressure: 151/10/15  Angiographic Findings:  Left main: No obstructive disease.   Left Anterior Descending Artery: Large caliber vessel that courses to the apex. There is a 20% stenosis proximally. The diagonal branch is patent.   Intermediate branch: Moderate sized. No disease noted.   Circumflex Artery: Moderate caliber vessel with a long area of 50% stenosis proximally followed by a 99% stenosis leading into the large OM branch. The continuation of the AV groove Circumflex is occluded beyond the marginal branch and fills from right to left collaterals, unchanged from last cath.   Right Coronary Artery: Large, dominant vessel with diffuse 30% stenosis in the mid vessel with patent overlapping stents in the distal vessel. There is minimal in-stent restenosis. The distal AV groove Circumflex is seen to fill from right to left collaterals.   Left Ventricular Angiogram: LVEF=50%   Impression: 1. Double vessel CAD with patent stents RCA and severe stenosis in the mid Circumflex extending into the first OM branch.  2. Low  normal LV systolic function 3. Unstable angina 4. Successful PTCA/bare metal stent x 1.   Recommendations: She will need dual antiplatelet with ASA and Plavix for at least one month before her planned surgical procedure. Continue statin/beta blocker. Smoking cessation recommended.        Complications:  None. The patient tolerated the procedure well.

## 2012-04-23 NOTE — ED Notes (Signed)
Family at bedside. 

## 2012-04-23 NOTE — Telephone Encounter (Signed)
Pt developed chest pain and shortness of breath when entering office for lab work. Seen by Norma Fredrickson, NP and Dr. Jens Som.  Pt  transported to [facility] via EMS.  Will call [facility] lab to cancel cath for tomorrow.

## 2012-04-23 NOTE — ED Notes (Signed)
Patient sent from [facility] for chest pain.  Is scheduled for heart cath tomorrow here.  Had 3 previous caths with stent placement.  3 NTg's and 324 ASA en route which brought pain from 10/10 to 4/10

## 2012-04-24 ENCOUNTER — Encounter: Payer: Self-pay | Admitting: Cardiology

## 2012-04-24 ENCOUNTER — Encounter (HOSPITAL_BASED_OUTPATIENT_CLINIC_OR_DEPARTMENT_OTHER): Payer: Self-pay

## 2012-04-24 ENCOUNTER — Inpatient Hospital Stay (HOSPITAL_BASED_OUTPATIENT_CLINIC_OR_DEPARTMENT_OTHER): Admit: 2012-04-24 | Payer: Federal, State, Local not specified - PPO | Admitting: Cardiovascular Disease

## 2012-04-24 DIAGNOSIS — E876 Hypokalemia: Secondary | ICD-10-CM | POA: Diagnosis present

## 2012-04-24 DIAGNOSIS — I200 Unstable angina: Secondary | ICD-10-CM | POA: Diagnosis present

## 2012-04-24 DIAGNOSIS — R739 Hyperglycemia, unspecified: Secondary | ICD-10-CM | POA: Diagnosis present

## 2012-04-24 LAB — BASIC METABOLIC PANEL
CO2: 25 mEq/L (ref 19–32)
Chloride: 106 mEq/L (ref 96–112)
GFR calc non Af Amer: >90 mL/min (ref >90)
Glucose, Bld: 143 mg/dL — ABNORMAL HIGH (ref 70–99)
Potassium: 3.4 mEq/L — ABNORMAL LOW (ref 3.5–5.1)
Sodium: 140 mEq/L (ref 135–145)

## 2012-04-24 LAB — CBC
Hemoglobin: 10.9 g/dL — ABNORMAL LOW (ref 12.0–15.0)
MCHC: 32.8 g/dL (ref 30.0–36.0)
RBC: 3.65 MIL/uL — ABNORMAL LOW (ref 3.87–5.11)
WBC: 4.5 10*3/uL (ref 4.0–10.5)

## 2012-04-24 SURGERY — JV LEFT HEART CATHETERIZATION WITH CORONARY ANGIOGRAM
Anesthesia: Moderate Sedation

## 2012-04-24 MED ORDER — CLOPIDOGREL BISULFATE 75 MG PO TABS: 75.0000 mg | ORAL_TABLET | Freq: Every day | ORAL | Status: DC

## 2012-04-24 MED ORDER — POTASSIUM CHLORIDE CRYS ER 20 MEQ PO TBCR
60.0000 meq | EXTENDED_RELEASE_TABLET | Freq: Once | ORAL | Status: AC
  Administered 2012-04-24: 60 meq via ORAL
  Filled 2012-04-24: qty 3

## 2012-04-24 MED FILL — Dextrose Inj 5%: INTRAVENOUS | Qty: 50 | Status: AC

## 2012-04-24 NOTE — Progress Notes (Signed)
    SUBJECTIVE: No chest pain or SOB this am. No events overnight.  BP 142/89  Pulse 60  Temp 97.9 F (36.6 C) (Oral)  Resp 11  Ht 4' 11.06" (1.5 m)  Wt 111 lb 5.3 oz (50.5 kg)  BMI 22.44 kg/m2  SpO2 100%  Intake/Output Summary (Last 24 hours) at 04/24/12 0707 Last data filed at 04/24/12 0600  Gross per 24 hour  Intake 291.25 ml  Output    621 ml  Net -329.75 ml    PHYSICAL EXAM General: Well developed, well nourished, in no acute distress. Alert and oriented x 3.  Psych:  Good affect, responds appropriately Neck: No JVD. No masses noted.  Lungs: Clear bilaterally with no wheezes or rhonci noted.  Heart: RRR with no murmurs noted. Abdomen: Bowel sounds are present. Soft, non-tender.  Extremities: No lower extremity edema. Right groin without hematoma.   LABS: Basic Metabolic Panel:  Basename 04/24/12 0520 04/23/12 1321  NA 140 141  K 3.4* 3.4*  CL 106 106  CO2 25 24  GLUCOSE 143* 168*  BUN 11 13  CREATININE 0.69 0.57  CALCIUM 8.9 9.3  MG -- --  PHOS -- --   CBC:  Basename 04/24/12 0520 04/23/12 1321  WBC 4.5 4.6  NEUTROABS -- --  HGB 10.9* 12.0  HCT 33.2* 35.1*  MCV 91.0 90.7  PLT 234 244   Cardiac Enzymes:  Basename 04/23/12 1322  CKTOTAL 69  CKMB 1.5  CKMBINDEX --  TROPONINI <0.30   Current Meds:    . aspirin  324 mg Oral Pre-Cath  . aspirin  81 mg Oral Daily  . clopidogrel  75 mg Oral Q breakfast  . diazepam  5 mg Oral On Call  . ezetimibe-simvastatin  1 tablet Oral QHS  . hydroxychloroquine  200 mg Oral Daily  . metoprolol succinate  25 mg Oral QHS  . DISCONTD: sodium chloride  3 mL Intravenous Q12H     ASSESSMENT AND PLAN:  1. Unstable angina/CAD: Pt admitted from office yesterday with active chest pain. She had undergone stress testing earlier this week and had a positive stress myoview. Her presentation was c/w unstable angina. Cardiac cath with severe stenosis mid Circumflex leading into OM1, now s/p bare metal stent x 1. She was  found to have patent stents in the distal RCA and is known to have chronic total occlusion of the distal AV groove Circumflex that fills from right to left collaterals.      -She will need ASA/Plavix for at least one month before she considers surgery for removal of a superficial mass on her back. Continue Vytorin, metoprolol       2. Hypokalemia: Replace this am  3. Dispo: d/c home today. She will need work excuse for one week off (works for the [name] [employer]). Follow up with me in 3 weeks. I will plan ASA/Plavix for one month and then she can hold Plavix for removal of back mass if necessary.    MCALHANY,CHRISTOPHER  10/10/20137:07 AM

## 2012-04-24 NOTE — Discharge Summary (Signed)
See full note today. cdm 

## 2012-04-24 NOTE — Discharge Summary (Signed)
CARDIOLOGY DISCHARGE SUMMARY   Patient ID: [name] MRN: [id number] DOB/AGE: October 11, 1955 56 y.o.  Admit date: 04/23/2012 Discharge date: 04/24/2012  Primary Discharge Diagnosis:  Unstable anginal pain, S/P 2.5 x 15 mm Vision bare metal stent in the mid Circumflex  Secondary Discharge Diagnosis:  Active Problems:  HTN (hypertension)  Discoid lupus erythematosus  Single skin nodule  Chest pain  Intermediate coronary syndrome  Hypokalemia Hyperglycemia  Procedure Performed:  1. Left Heart Catheterization 2. Selective Coronary Angiography 3. Left ventricular angiogram 4. PTCA/bare metal stent x 1 mid Circumflex  Hospital Course: [name] is a 56 year old female with a history of coronary artery disease. She she was seen in the office for pre-op clearance. A stress Myoview is abnormal. When she came by the office to pick up orders for her heart catheterization, she developed chest pain. She was transferred immediately to the hospital and admitted for further evaluation.  Cardiac enzymes were negative for MI. She was continued on her home medications for hypertension and discoid lupus. Labs were reviewed and were within normal limits pre-procedure except for a slightly elevated blood glucose level. Hemoglobin A1c has been done and results are pending at this time. She was taken to the cath lab on 04/23/2012, full results below. Dr. Clifton James reviewed all the information and felt that with her upcoming surgical procedure, a bare-metal stent would be best. She had a bare-metal stent to the mid circumflex and tolerated the procedure well.  On 10/10 2013, [name] was seen by Dr. Clifton James. Her cath site was without ecchymosis or hematoma. Her potassium was slightly low and was supplemented. She was mildly anemic, but this was felt secondary to blood draws and procedural losses. She was she was ambulating without chest pain or shortness of breath and considered stable for discharge  in improved condition, to follow up as an outpatient.  Labs:   Lab Results  Component Value Date   WBC 4.5 04/24/2012   HGB 10.9* 04/24/2012   HCT 33.2* 04/24/2012   MCV 91.0 04/24/2012   PLT 234 04/24/2012    Lab 04/24/12 0520  NA 140  K 3.4*  CL 106  CO2 25  BUN 11  CREATININE 0.69  CALCIUM 8.9  PROT --  BILITOT --  ALKPHOS --  ALT --  AST --  GLUCOSE 143*    Basename 04/23/12 1322  CKTOTAL 69  CKMB 1.5  CKMBINDEX --  TROPONINI <0.30    Basename 04/23/12 1321  INR 0.95   Cardiac Cath: 04/23/2012 Left main: No obstructive disease.  Left Anterior Descending Artery: Large caliber vessel that courses to the apex. There is a 20% stenosis proximally. The diagonal branch is patent.  Intermediate branch: Moderate sized. No disease noted.  Circumflex Artery: Moderate caliber vessel with a long area of 50% stenosis proximally followed by a 99% stenosis leading into the large OM branch. The continuation of the AV groove Circumflex is occluded beyond the marginal branch and fills from right to left collaterals, unchanged from last cath.  Right Coronary Artery: Large, dominant vessel with diffuse 30% stenosis in the mid vessel with patent overlapping stents in the distal vessel. There is minimal in-stent restenosis. The distal AV groove Circumflex is seen to fill from right to left collaterals.  Left Ventricular Angiogram: LVEF=50%  Impression:  1. Double vessel CAD with patent stents RCA and severe stenosis in the mid Circumflex extending into the first OM branch.  2. Low normal LV systolic function  3. Unstable  angina  4. Successful PTCA/bare metal stent x 1.  Recommendations: She will need dual antiplatelet with ASA and Plavix for at least one month before her planned surgical procedure. Continue statin/beta blocker. Smoking cessation recommended.    EKG: *24-Apr-2012 04:27:23 [facility] ROUTINE RECORD Sinus bradycardia Possible Left atrial  enlargement Borderline ECG 68mm/s 39mm/mV 100Hz  8.0.1 12SL 241 HD CID: 1 Referred by: CHRISTHOPE MCALHANY Unconfirmed Vent. rate 58 BPM PR interval 156 ms QRS duration 80 ms QT/QTc 436/428 ms P-R-T axes 60 56 48   FOLLOW UP PLANS AND APPOINTMENTS No Known Allergies   Medication List     As of 04/24/2012  9:38 AM    TAKE these medications         acetaminophen 500 MG tablet   Commonly known as: TYLENOL   Take 1,000 mg by mouth every 6 (six) hours as needed. For pain      aspirin 81 MG tablet   Take 81 mg by mouth daily.      clopidogrel 75 MG tablet   Commonly known as: PLAVIX   Take 1 tablet (75 mg total) by mouth daily.      ezetimibe-simvastatin 10-40 MG per tablet   Commonly known as: VYTORIN   Take 1 tablet by mouth at bedtime.      HYDROcodone-acetaminophen 7.5-750 MG per tablet   Commonly known as: VICODIN ES   Take 1 tablet by mouth every 6 (six) hours as needed. For pain      hydroxychloroquine 200 MG tablet   Commonly known as: PLAQUENIL   Take 200 mg by mouth daily.      metoprolol succinate 25 MG 24 hr tablet   Commonly known as: TOPROL-XL   Take 25 mg by mouth at bedtime.      nitroGLYCERIN 0.4 MG SL tablet   Commonly known as: NITROSTAT   Place 0.4 mg under the tongue every 5 (five) minutes as needed. For chest pain      zolpidem 10 MG tablet   Commonly known as: AMBIEN   Take 10 mg by mouth at bedtime as needed. For sleep            Discharge Orders    Future Appointments: Provider: Department: Dept Phone: Center:   05/15/2012 10:30 AM Kathleene Hazel, MD [facility] [phone] [facility]   06/09/2012 8:45 AM Kathleene Hazel, MD [facility] [phone] [facility]     Future Orders Please Complete By Expires   Diet - low sodium heart healthy      Increase activity slowly        Follow-up Information    Follow up with [facility], MD. On 05/15/2012. (10:30)    Contact information:      [facility] [address] [place] [address] [phone]          BRING ALL MEDICATIONS WITH YOU TO FOLLOW UP APPOINTMENTS  Time spent with patient to include physician time: *33 min Signed: Theodore Demark 04/24/2012, 9:38 AM Co-Sign MD

## 2012-04-24 NOTE — Progress Notes (Signed)
CARDIAC REHAB PHASE I   PRE:  Rate/Rhythm: 65SR  BP:  Supine: 136/77  Sitting:   Standing:    SaO2:   MODE:  Ambulation: 700 ft   POST:  Rate/Rhythem: 81SR  BP:  Supine:   Sitting: 153/8  Standing:    SaO2: 100%RA 0840-0950 Pt walked 762ft with steady gait. Tolerated well. C/o slightly SOB but not CP. Sats good on RA. Education completed. Discussed smoking cessation and handouts given. Strongly encouraged CRP 2 as pt needs support group. Pt gave permission to refer to [facility] Phase 2. Pt has many personal stressors. Emotional support given.  Duanne Limerick

## 2012-05-08 ENCOUNTER — Ambulatory Visit (HOSPITAL_COMMUNITY): Payer: Federal, State, Local not specified - PPO

## 2012-05-12 ENCOUNTER — Ambulatory Visit (HOSPITAL_COMMUNITY): Payer: Federal, State, Local not specified - PPO

## 2012-05-14 ENCOUNTER — Ambulatory Visit (HOSPITAL_COMMUNITY): Payer: Federal, State, Local not specified - PPO

## 2012-05-15 ENCOUNTER — Ambulatory Visit (HOSPITAL_COMMUNITY): Payer: Federal, State, Local not specified - PPO

## 2012-05-15 ENCOUNTER — Encounter: Payer: Self-pay | Admitting: *Deleted

## 2012-05-15 ENCOUNTER — Encounter: Payer: Self-pay | Admitting: Cardiovascular Disease

## 2012-05-15 ENCOUNTER — Ambulatory Visit (INDEPENDENT_AMBULATORY_CARE_PROVIDER_SITE_OTHER): Payer: Federal, State, Local not specified - PPO | Admitting: Cardiovascular Disease

## 2012-05-15 VITALS — BP 135/86 | HR 54 | Ht 59.0 in | Wt 110.0 lb

## 2012-05-15 DIAGNOSIS — I2510 Atherosclerotic heart disease of native coronary artery without angina pectoris: Secondary | ICD-10-CM

## 2012-05-15 NOTE — Patient Instructions (Addendum)
Your physician wants you to follow-up in:  6 months. You will receive a reminder letter in the mail two months in advance. If you don't receive a letter, please call our office to schedule the follow-up appointment.  Your physician recommends that you return for fasting lab work on May 19, 2012.  The lab opens at 8:30 daily

## 2012-05-15 NOTE — Progress Notes (Addendum)
History of Present Illness: 56 yo female here today for cardiac follow up.  She is a former patient of Dr. Ronnald Nian.  She has known CAD with prior PCI of the RCA followed by stenting of the RCA in 2002. Last cath was in 2010 and showed that her RCA was subtotally occluded distally. She then had two overlapping drug eluting stents placed in the distal RCA with an overall satisfactory result obtained. She has a total of 3 stents in the RCA. LV function was normal. She does have moderate disease in the LCX with minimal in the LAD. She has been managed medically Her other issues include tobacco abuse, cutaneous lupus, anxiety, depression, HLD, mild PVD and ongoing tobacco abuse. She was seen by Norma Fredrickson 1004/13 and had c/o chest pressure. She has also been found to have a mass under her left shoulder that needs removal. Stress test was arranged and showed possible lateral wall ischemia. She was brought in for a cath on 04/23/12 and was found to have severe stenosis obtuse marginal treated with a bare metal stent.   She is here for follow up and is doing well. No chest pain or SOB.   Primary Care Physician: Johny Blamer Rheumatology: Orlin Hilding General surgery: Lodema Pilot  Last Lipid Profile:Lipid Panel     Component Value Date/Time   CHOL 123 12/04/2010 0927   TRIG 45.0 12/04/2010 0927   HDL 49.80 12/04/2010 0927   CHOLHDL 2 12/04/2010 0927   VLDL 9.0 12/04/2010 0927   LDLCALC 64 12/04/2010 4098     Past Medical History  Diagnosis Date  . CAD (coronary artery disease)     Remote PCI to RCA in 2001, stent to the RCA in 2002, with last cath in 2010 with placement of two overlapping drug eluting  stents to the RCA  . 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 Anxiety   . Depression   . Noncompliance   . PVD (peripheral vascular disease)     MILD  . H/O: substance abuse   . Hyperlipidemia   . Bence Jones proteinuria 02/29/2012    Random urine "M spike" 7.1% 02/13/12  .  Anginal pain   . Myocardial infarction     "not sure; nobody ever tells me" (04/23/2012)    Past Surgical History  Procedure Date  . Coronary angioplasty with stent placement 2002; 2010; 04/23/2012    "1 + 2 + 1; total of 4"  . Tonsillectomy   . Excisional hemorrhoidectomy   . Anterior cruciate ligament repair     "have had 12 surgeries on my right ACL"  . Hernia repair     points to stomach  . Abdominal hysterectomy 1999  . Breast surgery     "removed knot from one side"    Current Outpatient Prescriptions  Medication Sig Dispense Refill  . acetaminophen (TYLENOL) 500 MG tablet Take 1,000 mg by mouth every 6 (six) hours as needed. For pain      . aspirin 81 MG tablet Take 81 mg by mouth daily.        . clopidogrel (PLAVIX) 75 MG tablet Take 1 tablet (75 mg total) by mouth daily.  30 tablet  11  . ezetimibe-simvastatin (VYTORIN) 10-40 MG per tablet Take 1 tablet by mouth at bedtime.      . hydroxychloroquine (PLAQUENIL) 200 MG tablet Take 200 mg by mouth daily.       . hydrOXYzine (ATARAX/VISTARIL) 10 MG tablet Take when needed  for itching      . metoprolol succinate (TOPROL-XL) 25 MG 24 hr tablet Take 25 mg by mouth at bedtime.       . nitroGLYCERIN (NITROSTAT) 0.4 MG SL tablet Place 0.4 mg under the tongue every 5 (five) minutes as needed. For chest pain      . zolpidem (AMBIEN) 10 MG tablet Take 10 mg by mouth at bedtime as needed. For sleep        No Known Allergies  History   Social History  . Marital Status: Single    Spouse Name: N/A    Number of Children: N/A  . Years of Education: N/A   Occupational History  . Not on file.   Social History Main Topics  . Smoking status: Former Smoker -- 1.5 packs/day for 38 years    Types: Cigarettes    Quit date: 04/20/2012  . Smokeless tobacco: Never Used  . Alcohol Use: 8.4 oz/week    14 Cans of beer per week  . Drug Use: Yes    Special: Marijuana     04/23/2012 "last marijuana last Friday; puff or 2"  . Sexually  Active: No   Other Topics Concern  . Not on file   Social History Narrative  . No narrative on file    Family History  Problem Relation Age of Onset  . Hypertension Mother   . Lung cancer Mother   . Heart disease Father   . Hypertension Father   . Stroke Father   . Heart failure Father     Review of Systems:  As stated in the HPI and otherwise negative.   BP 135/86  Pulse 54  Ht 4\' 11"  (1.499 m)  Wt 110 lb (49.896 kg)  BMI 22.22 kg/m2  Physical Examination: General: Well developed, well nourished, NAD HEENT: OP clear, mucus membranes moist SKIN: warm, dry. No rashes. Neuro: No focal deficits Musculoskeletal: Muscle strength 5/5 all ext Psychiatric: Mood and affect normal Neck: No JVD, no carotid bruits, no thyromegaly, no lymphadenopathy. Lungs:Clear bilaterally, no wheezes, rhonci, crackles Cardiovascular: Regular rate and rhythm. No murmurs, gallops or rubs. Abdomen:Soft. Bowel sounds present. Non-tender.  Extremities: No lower extremity edema. Pulses are 2 + in the bilateral DP/PT.  Stress myoview: 1007/13:  Stress Procedure: The patient received IV Lexiscan 0.4 mg over 15-seconds. Technetium 46m Sestamibi injected at 30-seconds. There were no significant changes, sob, abdominal pain, chest, and throat thightness with Lexiscan. Quantitative spect images were obtained after a 45 minute delay.  Stress ECG: No significant change from baseline ECG  QPS  Raw Data Images: Normal; no motion artifact; normal heart/lung ratio.  Stress Images: There is a small area of decreased activity affecting the base inferolateral segment in the mid inferolateral segment. The degree of photon reduction is mild.  Rest Images: There is very slight decreased photon activity at the base of the inferolateral wall.  Subtraction (SDS): There is mild reversibility at the base of the inferior wall.  Transient Ischemic Dilatation (Normal <1.22): 1.14  Lung/Heart Ratio (Normal <0.45): 0.32    Quantitative Gated Spect Images  QGS EDV: 69 ml  QGS ESV: 32 ml  Impression  Exercise Capacity: Adenosine study with no exercise.  BP Response: Normal blood pressure response.  Clinical Symptoms: shortness of breath  ECG Impression: No significant ST segment change suggestive of ischemia.  Comparison with Prior Nuclear Study: No images to compare  Overall Impression: The findings suggest a small area of mild ischemia at the base inferolateral  and mid inferolateral segments.  LV Ejection Fraction: 53%. LV Wall Motion: Normal Wall Motion.  Cardiac cath 04/23/12:  Left main: No obstructive disease.  Left Anterior Descending Artery: Large caliber vessel that courses to the apex. There is a 20% stenosis proximally. The diagonal branch is patent.  Intermediate branch: Moderate sized. No disease noted.  Circumflex Artery: Moderate caliber vessel with a long area of 50% stenosis proximally followed by a 99% stenosis leading into the large OM branch. The continuation of the AV groove Circumflex is occluded beyond the marginal branch and fills from right to left collaterals, unchanged from last cath.  Right Coronary Artery: Large, dominant vessel with diffuse 30% stenosis in the mid vessel with patent overlapping stents in the distal vessel. There is minimal in-stent restenosis. The distal AV groove Circumflex is seen to fill from right to left collaterals.  Left Ventricular Angiogram: LVEF=50%  Impression:  1. Double vessel CAD with patent stents RCA and severe stenosis in the mid Circumflex extending into the first OM branch.  2. Low normal LV systolic function  3. Unstable angina  4. Successful PTCA/bare metal stent x 1.   EKG: Sinus brady, rate 48 bpm.   Assessment and Plan:   1. CAD: Recent unstable angina, cath 04/23/12 with severe stenosis Circumflex treated with bare metal stent. Will need dual antiplatelet therapy with ASA and Plavix for at least one month. She could have her surgery  after 05/24/12 but I would prefer 3 months of ASA/Plavix if possible if the surgery is elective. Continue other meds. Check fasting lipids and LFTS.   2. Pre-operative clearance: She will need at least one month of ASA/Plavix with bare metal stent placed 04/23/12. Would delay any elective surgical procedures until January if possible since she presented with an acute coronary syndrome. Urgent procedures could be performed after 05/24/12.

## 2012-05-19 ENCOUNTER — Ambulatory Visit (HOSPITAL_COMMUNITY): Payer: Federal, State, Local not specified - PPO

## 2012-05-19 ENCOUNTER — Other Ambulatory Visit (INDEPENDENT_AMBULATORY_CARE_PROVIDER_SITE_OTHER): Payer: Federal, State, Local not specified - PPO

## 2012-05-19 DIAGNOSIS — I2510 Atherosclerotic heart disease of native coronary artery without angina pectoris: Secondary | ICD-10-CM

## 2012-05-19 LAB — HEPATIC FUNCTION PANEL
Bilirubin, Direct: 0.1 mg/dL (ref 0.0–0.3)
Total Bilirubin: 0.5 mg/dL (ref 0.3–1.2)

## 2012-05-19 LAB — LIPID PANEL
LDL Cholesterol: 63 mg/dL (ref 0–99)
VLDL: 16.8 mg/dL (ref 0.0–40.0)

## 2012-05-21 ENCOUNTER — Ambulatory Visit (HOSPITAL_COMMUNITY): Payer: Federal, State, Local not specified - PPO

## 2012-05-26 ENCOUNTER — Ambulatory Visit (HOSPITAL_COMMUNITY): Payer: Federal, State, Local not specified - PPO

## 2012-05-28 ENCOUNTER — Ambulatory Visit (HOSPITAL_COMMUNITY): Payer: Federal, State, Local not specified - PPO

## 2012-06-02 ENCOUNTER — Ambulatory Visit (HOSPITAL_COMMUNITY): Payer: Federal, State, Local not specified - PPO

## 2012-06-02 ENCOUNTER — Other Ambulatory Visit: Payer: Self-pay | Admitting: Nurse Practitioner

## 2012-06-04 ENCOUNTER — Ambulatory Visit (HOSPITAL_COMMUNITY): Payer: Federal, State, Local not specified - PPO

## 2012-06-09 ENCOUNTER — Ambulatory Visit: Payer: Federal, State, Local not specified - PPO | Admitting: Cardiovascular Disease

## 2012-06-09 ENCOUNTER — Ambulatory Visit (HOSPITAL_COMMUNITY): Payer: Federal, State, Local not specified - PPO

## 2012-06-11 ENCOUNTER — Ambulatory Visit (HOSPITAL_COMMUNITY): Payer: Federal, State, Local not specified - PPO

## 2012-06-11 NOTE — Addendum Note (Signed)
Addended by: Lacie Scotts on: 06/11/2012 02:46 PM   Modules accepted: Orders

## 2012-06-16 ENCOUNTER — Ambulatory Visit (HOSPITAL_COMMUNITY): Payer: Federal, State, Local not specified - PPO

## 2012-06-18 ENCOUNTER — Ambulatory Visit (HOSPITAL_COMMUNITY): Payer: Federal, State, Local not specified - PPO

## 2012-06-23 ENCOUNTER — Ambulatory Visit (HOSPITAL_COMMUNITY): Payer: Federal, State, Local not specified - PPO

## 2012-06-25 ENCOUNTER — Ambulatory Visit (HOSPITAL_COMMUNITY): Payer: Federal, State, Local not specified - PPO

## 2012-08-19 ENCOUNTER — Telehealth: Payer: Self-pay | Admitting: Cardiovascular Disease

## 2012-08-19 NOTE — Telephone Encounter (Signed)
New Problem      Pt calling in to get clarification on medication. Wants to know if Dr. Clifton James advised her to stop taking Plavix. Please call.

## 2012-08-19 NOTE — Telephone Encounter (Signed)
Spoke with pt. She reports she stopped Plavix on January 13,2014 when she ran out.  Reports she does have refills at pharmacy and is able to afford Plavix. Has been taking aspirin. She reports pain in shoulder has improved and she is not going to have surgery to remove mass under left shoulder.  She is asking if she should resume Plavix.  OK to leave message on cell phone--[phone].

## 2012-08-20 NOTE — Telephone Encounter (Signed)
She should resume Plavix. Thanks, chris

## 2012-08-20 NOTE — Telephone Encounter (Signed)
Message left on pt's voicemail with instructions from Dr. Clifton James

## 2012-09-05 ENCOUNTER — Other Ambulatory Visit: Payer: Self-pay | Admitting: Gynecology

## 2012-09-05 DIAGNOSIS — Z1231 Encounter for screening mammogram for malignant neoplasm of breast: Secondary | ICD-10-CM

## 2012-09-08 ENCOUNTER — Ambulatory Visit
Admission: RE | Admit: 2012-09-08 | Discharge: 2012-09-08 | Disposition: A | Discharge status: Home / Self Care | Payer: Federal, State, Local not specified - PPO | Source: Ambulatory Visit | Attending: Gynecology | Admitting: Gynecology

## 2012-09-08 DIAGNOSIS — Z1231 Encounter for screening mammogram for malignant neoplasm of breast: Secondary | ICD-10-CM

## 2012-11-11 ENCOUNTER — Ambulatory Visit (INDEPENDENT_AMBULATORY_CARE_PROVIDER_SITE_OTHER): Payer: Federal, State, Local not specified - PPO | Admitting: Cardiovascular Disease

## 2012-11-11 ENCOUNTER — Encounter: Payer: Self-pay | Admitting: Cardiovascular Disease

## 2012-11-11 VITALS — BP 130/76 | HR 60 | Ht 59.0 in | Wt 129.0 lb

## 2012-11-11 DIAGNOSIS — I2510 Atherosclerotic heart disease of native coronary artery without angina pectoris: Secondary | ICD-10-CM

## 2012-11-11 DIAGNOSIS — Z720 Tobacco use: Secondary | ICD-10-CM

## 2012-11-11 DIAGNOSIS — F17200 Nicotine dependence, unspecified, uncomplicated: Secondary | ICD-10-CM

## 2012-11-11 NOTE — Progress Notes (Signed)
History of Present Illness: 57 yo female here today for cardiac follow up. She is a former patient of Dr. Ronnald Nian. She has known CAD with prior PCI of the RCA followed by stenting of the RCA in 2002. Last cath was in 2010 and showed that her RCA was subtotally occluded distally. She then had two overlapping drug eluting stents placed in the distal RCA with an overall satisfactory result obtained. She has a total of 3 stents in the RCA. LV function was normal. She does have moderate disease in the LCX with minimal in the LAD. She has been managed medically Her other issues include tobacco abuse, cutaneous lupus, anxiety, depression, HLD, mild PVD and ongoing tobacco abuse. She was seen by Norma Fredrickson 1004/13 and had c/o chest pressure. She has also been found to have a mass under her left shoulder that needs removal. Stress test was arranged and showed possible lateral wall ischemia. She was brought in for a cath on 04/23/12 and was found to have severe stenosis obtuse marginal treated with a bare metal stent.   She is here for follow up and is doing well. No chest pain or SOB. She has stopped smoking completely.   Primary Care Physician: Johny Blamer  Rheumatology: Orlin Hilding  General surgery: Lodema Pilot  Last Lipid Profile:Lipid Panel     Component Value Date/Time   CHOL 125 05/19/2012 0852   TRIG 84.0 05/19/2012 0852   HDL 45.00 05/19/2012 0852   CHOLHDL 3 05/19/2012 0852   VLDL 16.8 05/19/2012 0852   LDLCALC 63 05/19/2012 0852     Past Medical History  Diagnosis Date  . CAD (coronary artery disease)     Remote PCI to RCA in 2001, stent to the RCA in 2002, with last cath in 2010 with placement of two overlapping drug eluting  stents to the RCA  . 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 Anxiety   . Depression   . Noncompliance   . PVD (peripheral vascular disease)     MILD  . H/O: substance abuse   . Hyperlipidemia   . Bence Jones proteinuria 02/29/2012    Random  urine "M spike" 7.1% 02/13/12  . Anginal pain   . Myocardial infarction     "not sure; nobody ever tells me" (04/23/2012)    Past Surgical History  Procedure Laterality Date  . Coronary angioplasty with stent placement  2002; 2010; 04/23/2012    "1 + 2 + 1; total of 4"  . Tonsillectomy    . Excisional hemorrhoidectomy    . Anterior cruciate ligament repair      "have had 12 surgeries on my right ACL"  . Hernia repair      points to stomach  . Abdominal hysterectomy  1999  . Breast surgery      "removed knot from one side"    Current Outpatient Prescriptions  Medication Sig Dispense Refill  . acetaminophen (TYLENOL) 500 MG tablet Take 1,000 mg by mouth every 6 (six) hours as needed. For pain      . aspirin 81 MG tablet Take 81 mg by mouth daily.        . clopidogrel (PLAVIX) 75 MG tablet Take 1 tablet (75 mg total) by mouth daily.  30 tablet  11  . hydroxychloroquine (PLAQUENIL) 200 MG tablet Take 200 mg by mouth daily.       . hydrOXYzine (ATARAX/VISTARIL) 10 MG tablet Take when needed for itching      .  metoprolol succinate (TOPROL-XL) 25 MG 24 hr tablet TAKE 1 TABLET DAILY  90 tablet  3  . nitroGLYCERIN (NITROSTAT) 0.4 MG SL tablet Place 0.4 mg under the tongue every 5 (five) minutes as needed. For chest pain      . VYTORIN 10-40 MG per tablet TAKE 1 TABLET AT BEDTIME  90 tablet  3  . zolpidem (AMBIEN) 10 MG tablet Take 10 mg by mouth at bedtime as needed. For sleep       No current facility-administered medications for this visit.    No Known Allergies  History   Social History  . Marital Status: Single    Spouse Name: N/A    Number of Children: N/A  . Years of Education: N/A   Occupational History  . Not on file.   Social History Main Topics  . Smoking status: Former Smoker -- 1.50 packs/day for 38 years    Types: Cigarettes    Quit date: 04/20/2012  . Smokeless tobacco: Never Used  . Alcohol Use: 8.4 oz/week    14 Cans of beer per week  . Drug Use: Yes     Special: Marijuana     Comment: 04/23/2012 "last marijuana last Friday; puff or 2"  . Sexually Active: No   Other Topics Concern  . Not on file   Social History Narrative  . No narrative on file    Family History  Problem Relation Age of Onset  . Hypertension Mother   . Lung cancer Mother   . Heart disease Father   . Hypertension Father   . Stroke Father   . Heart failure Father     Review of Systems:  As stated in the HPI and otherwise negative.   BP 130/76  Pulse 60  Ht 4\' 11"  (1.499 m)  Wt 129 lb (58.514 kg)  BMI 26.04 kg/m2  Physical Examination: General: Well developed, well nourished, NAD HEENT: OP clear, mucus membranes moist SKIN: warm, dry. No rashes. Neuro: No focal deficits Musculoskeletal: Muscle strength 5/5 all ext Psychiatric: Mood and affect normal Neck: No JVD, no carotid bruits, no thyromegaly, no lymphadenopathy. Lungs:Clear bilaterally, no wheezes, rhonci, crackles Cardiovascular: Regular rate and rhythm. No murmurs, gallops or rubs. Abdomen:Soft. Bowel sounds present. Non-tender.  Extremities: No lower extremity edema. Pulses are 2 + in the bilateral DP/PT.  Cardiac cath 04/23/12:  Left main: No obstructive disease.  Left Anterior Descending Artery: Large caliber vessel that courses to the apex. There is a 20% stenosis proximally. The diagonal branch is patent.  Intermediate branch: Moderate sized. No disease noted.  Circumflex Artery: Moderate caliber vessel with a long area of 50% stenosis proximally followed by a 99% stenosis leading into the large OM branch. The continuation of the AV groove Circumflex is occluded beyond the marginal branch and fills from right to left collaterals, unchanged from last cath.  Right Coronary Artery: Large, dominant vessel with diffuse 30% stenosis in the mid vessel with patent overlapping stents in the distal vessel. There is minimal in-stent restenosis. The distal AV groove Circumflex is seen to fill from right to  left collaterals.  Left Ventricular Angiogram: LVEF=50%  Impression:  1. Double vessel CAD with patent stents RCA and severe stenosis in the mid Circumflex extending into the first OM branch.  2. Low normal LV systolic function  3. Unstable angina  4. Successful PTCA/bare metal stent x 1.   Assessment and Plan:   1. CAD: Recent unstable angina, cath 04/23/12 with severe stenosis Circumflex treated  with bare metal stent. She feels great. Will stop Plavix. Continue ASA 81 mg po Qdaily. Continue beta blocker and statin. Lipids controlled. BP controlled.   2. Tobacco abuse: She has stopped smoking.

## 2012-11-11 NOTE — Patient Instructions (Signed)
Your physician wants you to follow-up in:  6 months. You will receive a reminder letter in the mail two months in advance. If you don't receive a letter, please call our office to schedule the follow-up appointment.  Your physician has recommended you make the following change in your medication:  Stop Plavix

## 2013-04-30 ENCOUNTER — Telehealth: Payer: Self-pay | Admitting: Cardiovascular Disease

## 2013-04-30 NOTE — Telephone Encounter (Signed)
New problem  Patient is having trouble with her shoulder. She went to see [facility]. Wants to know if its ok for her to take ibuprofen. Please leave a detailed if she doesn't answer.

## 2013-04-30 NOTE — Telephone Encounter (Signed)
Would not recommend using ibuprofen long term or every day but ok to use several times per week for shoulder pain. cdm

## 2013-04-30 NOTE — Telephone Encounter (Signed)
Spoke with pt and gave her information from Dr. McAlhany 

## 2013-04-30 NOTE — Telephone Encounter (Signed)
Follow up    Pt calling about the inflammation in her right shoulder.  Pt has stints and would like to take somthing For the pain,    Please return her call.

## 2013-05-19 ENCOUNTER — Telehealth: Payer: Self-pay | Admitting: Cardiovascular Disease

## 2013-05-19 NOTE — Telephone Encounter (Signed)
LM  Will forward to Dr. Sanjuana Kava.

## 2013-05-19 NOTE — Telephone Encounter (Signed)
Pt.notified

## 2013-05-19 NOTE — Telephone Encounter (Signed)
That should be ok. cdm 

## 2013-05-19 NOTE — Telephone Encounter (Signed)
New message   Pt saw pcp yesterday (Dr Wynelle Link) and she  want to know if pt can use valtarin gel for joint pain?

## 2013-05-27 ENCOUNTER — Encounter: Payer: Self-pay | Admitting: Cardiovascular Disease

## 2013-05-27 ENCOUNTER — Encounter (INDEPENDENT_AMBULATORY_CARE_PROVIDER_SITE_OTHER): Payer: Self-pay

## 2013-05-27 ENCOUNTER — Ambulatory Visit (INDEPENDENT_AMBULATORY_CARE_PROVIDER_SITE_OTHER): Payer: Federal, State, Local not specified - PPO | Admitting: Cardiovascular Disease

## 2013-05-27 ENCOUNTER — Encounter: Payer: Self-pay | Admitting: *Deleted

## 2013-05-27 VITALS — BP 140/80 | HR 47 | Ht 59.0 in | Wt 129.8 lb

## 2013-05-27 DIAGNOSIS — E785 Hyperlipidemia, unspecified: Secondary | ICD-10-CM

## 2013-05-27 DIAGNOSIS — Z87891 Personal history of nicotine dependence: Secondary | ICD-10-CM

## 2013-05-27 DIAGNOSIS — F17201 Nicotine dependence, unspecified, in remission: Secondary | ICD-10-CM

## 2013-05-27 DIAGNOSIS — I2510 Atherosclerotic heart disease of native coronary artery without angina pectoris: Secondary | ICD-10-CM

## 2013-05-27 LAB — LIPID PANEL
HDL: 41.8 mg/dL (ref >39.00)
LDL Cholesterol: 52 mg/dL (ref 0–99)
Total CHOL/HDL Ratio: 3
Triglycerides: 79 mg/dL (ref 0.0–149.0)
VLDL: 15.8 mg/dL (ref 0.0–40.0)

## 2013-05-27 LAB — HEPATIC FUNCTION PANEL: Albumin: 3.8 g/dL (ref 3.5–5.2)

## 2013-05-27 NOTE — Progress Notes (Signed)
History of Present Illness: 57 yo female here today for cardiac follow up. She is a former patient of Dr. Ronnald Nian. She has known CAD with prior PCI of the RCA followed by stenting of the RCA in 2002. Last cath was in 2010 and showed that her RCA was subtotally occluded distally. She then had two overlapping drug eluting stents placed in the distal RCA with an overall satisfactory result obtained. She has a total of 3 stents in the RCA. LV function was normal. She does have moderate disease in the LCX with minimal in the LAD. She has been managed medically Her other issues include tobacco abuse, cutaneous lupus, anxiety, depression, HLD, mild PVD and ongoing tobacco abuse. She was seen by Norma Fredrickson 1004/13 and had c/o chest pressure. Stress test was arranged and showed possible lateral wall ischemia. She was brought in for a cath on 04/23/12 and was found to have severe stenosis obtuse marginal treated with a bare metal stent.   She is here for follow up and is doing well. No chest pain or SOB. She has stopped smoking completely.   Primary Care Physician: Johny Blamer  Rheumatology: Orlin Hilding  General surgery: Lodema Pilot  Last Lipid Profile:Lipid Panel     Component Value Date/Time   CHOL 125 05/19/2012 0852   TRIG 84.0 05/19/2012 0852   HDL 45.00 05/19/2012 0852   CHOLHDL 3 05/19/2012 0852   VLDL 16.8 05/19/2012 0852   LDLCALC 63 05/19/2012 0852     Past Medical History  Diagnosis Date  . CAD (coronary artery disease)     Remote PCI to RCA in 2001, stent to the RCA in 2002, with last cath in 2010 with placement of two overlapping drug eluting  stents to the RCA  . 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 Anxiety   . Depression   . Noncompliance   . PVD (peripheral vascular disease)     MILD  . H/O: substance abuse   . Hyperlipidemia   . Bence Jones proteinuria 02/29/2012    Random urine "M spike" 7.1% 02/13/12  . Anginal pain   . Myocardial infarction    "not sure; nobody ever tells me" (04/23/2012)    Past Surgical History  Procedure Laterality Date  . Coronary angioplasty with stent placement  2002; 2010; 04/23/2012    "1 + 2 + 1; total of 4"  . Tonsillectomy    . Excisional hemorrhoidectomy    . Anterior cruciate ligament repair      "have had 12 surgeries on my right ACL"  . Hernia repair      points to stomach  . Abdominal hysterectomy  1999  . Breast surgery      "removed knot from one side"    Current Outpatient Prescriptions  Medication Sig Dispense Refill  . acetaminophen (TYLENOL) 500 MG tablet Take 1,000 mg by mouth every 6 (six) hours as needed. For pain      . aspirin 81 MG tablet Take 81 mg by mouth daily.        . hydrOXYzine (ATARAX/VISTARIL) 10 MG tablet Take when needed for itching      . metoprolol succinate (TOPROL-XL) 25 MG 24 hr tablet TAKE 1 TABLET DAILY  90 tablet  3  . nitroGLYCERIN (NITROSTAT) 0.4 MG SL tablet Place 0.4 mg under the tongue every 5 (five) minutes as needed. For chest pain      . traMADol (ULTRAM) 50 MG tablet 50 mg as  needed.      Marland Kitchen VYTORIN 10-40 MG per tablet TAKE 1 TABLET AT BEDTIME  90 tablet  3  . zolpidem (AMBIEN) 10 MG tablet Take 10 mg by mouth at bedtime as needed. For sleep      . hydroxychloroquine (PLAQUENIL) 200 MG tablet Take 200 mg by mouth daily.        No current facility-administered medications for this visit.    No Known Allergies  History   Social History  . Marital Status: Single    Spouse Name: N/A    Number of Children: N/A  . Years of Education: N/A   Occupational History  . Not on file.   Social History Main Topics  . Smoking status: Former Smoker -- 1.50 packs/day for 38 years    Types: Cigarettes    Quit date: 04/20/2012  . Smokeless tobacco: Never Used  . Alcohol Use: 8.4 oz/week    14 Cans of beer per week  . Drug Use: Yes    Special: Marijuana     Comment: 04/23/2012 "last marijuana last Friday; puff or 2"  . Sexual Activity: No   Other  Topics Concern  . Not on file   Social History Narrative  . No narrative on file    Family History  Problem Relation Age of Onset  . Hypertension Mother   . Lung cancer Mother   . Heart disease Father   . Hypertension Father   . Stroke Father   . Heart failure Father     Review of Systems:  As stated in the HPI and otherwise negative.   BP 140/80  Pulse 47  Ht 4\' 11"  (1.499 m)  Wt 129 lb 12.8 oz (58.877 kg)  BMI 26.20 kg/m2  Physical Examination: General: Well developed, well nourished, NAD HEENT: OP clear, mucus membranes moist SKIN: warm, dry. No rashes. Neuro: No focal deficits Musculoskeletal: Muscle strength 5/5 all ext Psychiatric: Mood and affect normal Neck: No JVD, no carotid bruits, no thyromegaly, no lymphadenopathy. Lungs:Clear bilaterally, no wheezes, rhonci, crackles Cardiovascular: Regular rate and rhythm. No murmurs, gallops or rubs. Abdomen:Soft. Bowel sounds present. Non-tender.  Extremities: No lower extremity edema. Pulses are 2 + in the bilateral DP/PT.  Cardiac cath 04/23/12:  Left main: No obstructive disease.  Left Anterior Descending Artery: Large caliber vessel that courses to the apex. There is a 20% stenosis proximally. The diagonal branch is patent.  Intermediate branch: Moderate sized. No disease noted.  Circumflex Artery: Moderate caliber vessel with a long area of 50% stenosis proximally followed by a 99% stenosis leading into the large OM branch. The continuation of the AV groove Circumflex is occluded beyond the marginal branch and fills from right to left collaterals, unchanged from last cath.  Right Coronary Artery: Large, dominant vessel with diffuse 30% stenosis in the mid vessel with patent overlapping stents in the distal vessel. There is minimal in-stent restenosis. The distal AV groove Circumflex is seen to fill from right to left collaterals.  Left Ventricular Angiogram: LVEF=50%  Impression:  1. Double vessel CAD with patent  stents RCA and severe stenosis in the mid Circumflex extending into the first OM branch.  2. Low normal LV systolic function  3. Unstable angina  4. Successful PTCA/bare metal stent x 1.   EKG: sinus brady, rate 50 bpm. T wave abnormality inferior leads.    Assessment and Plan:   1. CAD: Stable. Cath 04/23/12 with severe stenosis Circumflex treated with bare metal stent. Continue ASA 81  mg po Qdaily, beta blocker and statin. Lipids controlled. BP controlled.   2. Tobacco abuse: She has stopped smoking.   3. Hyperlipidemia: She is on a statin. Repeat lipids and LFTs today.

## 2013-05-27 NOTE — Patient Instructions (Signed)
Your physician wants you to follow-up in:  6 months. You will receive a reminder letter in the mail two months in advance. If you don't receive a letter, please call our office to schedule the follow-up appointment.   

## 2013-07-14 ENCOUNTER — Telehealth: Payer: Self-pay | Admitting: Cardiovascular Disease

## 2013-07-14 NOTE — Telephone Encounter (Signed)
lmtcb

## 2013-07-14 NOTE — Telephone Encounter (Signed)
Follow up     Pt called in saying she is returning this office's call about changing her medication. Please give pt a call back.

## 2013-07-14 NOTE — Telephone Encounter (Signed)
New Problem:  Pt states she is calling to get a new statin called in. Pt was taking vytorin but she would like something different.  Pt states she would like the new drug called into [facility] on [address]. Pt would like a call back to discuss her options.

## 2013-07-14 NOTE — Telephone Encounter (Signed)
Spoke with patient who had a question regarding a Rx - states there is no message to send to Dr. Clifton James; states she was trying to use up her health care spending.

## 2013-09-15 ENCOUNTER — Other Ambulatory Visit: Payer: Self-pay | Admitting: *Deleted

## 2013-09-15 MED ORDER — METOPROLOL SUCCINATE ER 25 MG PO TB24: ORAL_TABLET | ORAL | Status: DC

## 2013-09-15 MED ORDER — EZETIMIBE-SIMVASTATIN 10-40 MG PO TABS: ORAL_TABLET | ORAL | Status: DC

## 2013-11-23 ENCOUNTER — Encounter (INDEPENDENT_AMBULATORY_CARE_PROVIDER_SITE_OTHER): Payer: Self-pay

## 2013-11-23 ENCOUNTER — Ambulatory Visit (INDEPENDENT_AMBULATORY_CARE_PROVIDER_SITE_OTHER): Payer: Federal, State, Local not specified - PPO | Admitting: Cardiovascular Disease

## 2013-11-23 ENCOUNTER — Encounter: Payer: Self-pay | Admitting: Cardiovascular Disease

## 2013-11-23 VITALS — BP 126/64 | HR 77 | Wt 129.0 lb

## 2013-11-23 DIAGNOSIS — E785 Hyperlipidemia, unspecified: Secondary | ICD-10-CM

## 2013-11-23 DIAGNOSIS — I2510 Atherosclerotic heart disease of native coronary artery without angina pectoris: Secondary | ICD-10-CM

## 2013-11-23 DIAGNOSIS — F17201 Nicotine dependence, unspecified, in remission: Secondary | ICD-10-CM

## 2013-11-23 DIAGNOSIS — Z87891 Personal history of nicotine dependence: Secondary | ICD-10-CM

## 2013-11-23 MED ORDER — SIMVASTATIN 40 MG PO TABS: 40.0000 mg | ORAL_TABLET | Freq: Every day | ORAL | Status: DC

## 2013-11-23 MED ORDER — NITROGLYCERIN 0.4 MG SL SUBL: 0.4000 mg | SUBLINGUAL_TABLET | SUBLINGUAL | Status: DC | PRN

## 2013-11-23 NOTE — Progress Notes (Signed)
History of Present Illness: 58 yo female with history of CAD with prior PCI of the RCA with stenting of the RCA in 2002/2010 and stenting of the Circumflex in 2013, former tobacco abuse, cutaneous lupus, anxiety, depression, HLD, PAD here today for cardiac follow up. She is a former patient of Dr. Susa Simmonds. She has known CAD with prior PCI of the RCA followed by stenting of the RCA in 2002. Last cath was in 2010 and showed that her RCA was subtotally occluded distally. She then had two overlapping drug eluting stents placed in the distal RCA with an overall satisfactory result obtained. She has a total of 3 stents in the RCA. LV function was normal. She does have moderate disease in the LCX with minimal in the LAD. She has been managed medically. She was seen by Truitt Merle 1004/13 and had c/o chest pressure. Stress test was arranged and showed possible lateral wall ischemia. She was brought in for a cath on 04/23/12 and was found to have severe stenosis obtuse marginal treated with a bare metal stent.   She is here for follow up and is doing well. No chest pain or SOB. She has stopped smoking completely.   Primary Care Physician: Shirline Frees  Rheumatology: Berna Bue  General surgery: Madilyn Hook  Last Lipid Profile:Lipid Panel     Component Value Date/Time   CHOL 110 05/27/2013 1040   TRIG 79.0 05/27/2013 1040   HDL 41.80 05/27/2013 1040   CHOLHDL 3 05/27/2013 1040   VLDL 15.8 05/27/2013 1040   LDLCALC 52 05/27/2013 1040     Past Medical History  Diagnosis Date  . CAD (coronary artery disease)     Remote PCI to RCA in 2001, stent to the RCA in 2002, with last cath in 2010 with placement of two overlapping drug eluting  stents to the RCA  . 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 Anxiety   . Depression   . Noncompliance   . PVD (peripheral vascular disease)     MILD  . H/O: substance abuse   . Hyperlipidemia   . Bence Jones proteinuria 02/29/2012   Random urine "M spike" 7.1% 02/13/12  . Anginal pain   . Myocardial infarction     "not sure; nobody ever tells me" (04/23/2012)    Past Surgical History  Procedure Laterality Date  . Coronary angioplasty with stent placement  2002; 2010; 04/23/2012    "1 + 2 + 1; total of 4"  . Tonsillectomy    . Excisional hemorrhoidectomy    . Anterior cruciate ligament repair      "have had 12 surgeries on my right ACL"  . Hernia repair      points to stomach  . Abdominal hysterectomy  1999  . Breast surgery      "removed knot from one side"    Current Outpatient Prescriptions  Medication Sig Dispense Refill  . acetaminophen (TYLENOL) 500 MG tablet Take 1,000 mg by mouth every 6 (six) hours as needed. For pain      . aspirin 81 MG tablet Take 81 mg by mouth daily.        Marland Kitchen ezetimibe-simvastatin (VYTORIN) 10-40 MG per tablet TAKE 1 TABLET AT BEDTIME  90 tablet  0  . hydroxychloroquine (PLAQUENIL) 200 MG tablet Take 200 mg by mouth daily.       . hydrOXYzine (ATARAX/VISTARIL) 10 MG tablet Take when needed for itching      . metoprolol  succinate (TOPROL-XL) 25 MG 24 hr tablet TAKE 1 TABLET DAILY  90 tablet  0  . nitroGLYCERIN (NITROSTAT) 0.4 MG SL tablet Place 0.4 mg under the tongue every 5 (five) minutes as needed. For chest pain      . zolpidem (AMBIEN) 10 MG tablet Take 10 mg by mouth at bedtime as needed. For sleep       No current facility-administered medications for this visit.    No Known Allergies  History   Social History  . Marital Status: Single    Spouse Name: N/A    Number of Children: N/A  . Years of Education: N/A   Occupational History  . Not on file.   Social History Main Topics  . Smoking status: Former Smoker -- 1.50 packs/day for 38 years    Types: Cigarettes    Quit date: 04/20/2012  . Smokeless tobacco: Never Used  . Alcohol Use: 8.4 oz/week    14 Cans of beer per week  . Drug Use: Yes    Special: Marijuana     Comment: 04/23/2012 "last marijuana last  Friday; puff or 2"  . Sexual Activity: No   Other Topics Concern  . Not on file   Social History Narrative  . No narrative on file    Family History  Problem Relation Age of Onset  . Hypertension Mother   . Lung cancer Mother   . Heart disease Father   . Hypertension Father   . Stroke Father   . Heart failure Father     Review of Systems:  As stated in the HPI and otherwise negative.   BP 126/64  Pulse 77  Wt 129 lb (58.514 kg)  Physical Examination: General: Well developed, well nourished, NAD HEENT: OP clear, mucus membranes moist SKIN: warm, dry. No rashes. Neuro: No focal deficits Musculoskeletal: Muscle strength 5/5 all ext Psychiatric: Mood and affect normal Neck: No JVD, no carotid bruits, no thyromegaly, no lymphadenopathy. Lungs:Clear bilaterally, no wheezes, rhonci, crackles Cardiovascular: Regular rate and rhythm. No murmurs, gallops or rubs. Abdomen:Soft. Bowel sounds present. Non-tender.  Extremities: No lower extremity edema. Pulses are 2 + in the bilateral DP/PT.  Cardiac cath 04/23/12:  Left main: No obstructive disease.  Left Anterior Descending Artery: Large caliber vessel that courses to the apex. There is a 20% stenosis proximally. The diagonal branch is patent.  Intermediate branch: Moderate sized. No disease noted.  Circumflex Artery: Moderate caliber vessel with a long area of 50% stenosis proximally followed by a 99% stenosis leading into the large OM branch. The continuation of the AV groove Circumflex is occluded beyond the marginal branch and fills from right to left collaterals, unchanged from last cath.  Right Coronary Artery: Large, dominant vessel with diffuse 30% stenosis in the mid vessel with patent overlapping stents in the distal vessel. There is minimal in-stent restenosis. The distal AV groove Circumflex is seen to fill from right to left collaterals.  Left Ventricular Angiogram: LVEF=50%  Impression:  1. Double vessel CAD with  patent stents RCA and severe stenosis in the mid Circumflex extending into the first OM branch.  2. Low normal LV systolic function  3. Unstable angina  4. Successful PTCA/bare metal stent x 1.  Assessment and Plan:   1. CAD: Stable. Cath 04/23/12 with severe stenosis Circumflex treated with bare metal stent. Continue ASA 81 mg po Qdaily, beta blocker and statin. Lipids controlled. BP controlled.   2. Tobacco abuse, in remission: She has stopped smoking.  3. Hyperlipidemia: She is on Vytorin. Will change to simvastatin 40 mg daily. Lipids controlled.

## 2013-11-23 NOTE — Patient Instructions (Signed)
Your physician wants you to follow-up in: 12 months.   You will receive a reminder letter in the mail two months in advance. If you don't receive a letter, please call our office to schedule the follow-up appointment.  Your physician has recommended you make the following change in your medication:  Stop Vytorin. Start Simvastatin 40 mg by mouth daily

## 2014-02-09 ENCOUNTER — Other Ambulatory Visit: Payer: Self-pay | Admitting: Cardiovascular Disease

## 2014-06-24 ENCOUNTER — Encounter (HOSPITAL_COMMUNITY): Payer: Self-pay | Admitting: Cardiovascular Disease

## 2014-09-13 ENCOUNTER — Other Ambulatory Visit: Payer: Self-pay

## 2014-09-13 DIAGNOSIS — Z1231 Encounter for screening mammogram for malignant neoplasm of breast: Secondary | ICD-10-CM

## 2014-09-14 ENCOUNTER — Ambulatory Visit
Admission: RE | Admit: 2014-09-14 | Discharge: 2014-09-14 | Disposition: A | Discharge status: Home / Self Care | Payer: Federal, State, Local not specified - PPO | Source: Ambulatory Visit

## 2014-09-14 DIAGNOSIS — Z1231 Encounter for screening mammogram for malignant neoplasm of breast: Secondary | ICD-10-CM

## 2014-09-26 ENCOUNTER — Emergency Department (HOSPITAL_BASED_OUTPATIENT_CLINIC_OR_DEPARTMENT_OTHER)
Admission: EM | Admit: 2014-09-26 | Discharge: 2014-09-26 | Disposition: A | Discharge status: Home / Self Care | Attending: Emergency Medicine | Admitting: Emergency Medicine

## 2014-09-26 ENCOUNTER — Encounter (HOSPITAL_BASED_OUTPATIENT_CLINIC_OR_DEPARTMENT_OTHER): Payer: Self-pay

## 2014-09-26 ENCOUNTER — Emergency Department (HOSPITAL_BASED_OUTPATIENT_CLINIC_OR_DEPARTMENT_OTHER)

## 2014-09-26 DIAGNOSIS — Z9861 Coronary angioplasty status: Secondary | ICD-10-CM | POA: Insufficient documentation

## 2014-09-26 DIAGNOSIS — Z8739 Personal history of other diseases of the musculoskeletal system and connective tissue: Secondary | ICD-10-CM | POA: Diagnosis not present

## 2014-09-26 DIAGNOSIS — W208XXA Other cause of strike by thrown, projected or falling object, initial encounter: Secondary | ICD-10-CM | POA: Diagnosis not present

## 2014-09-26 DIAGNOSIS — I252 Old myocardial infarction: Secondary | ICD-10-CM | POA: Insufficient documentation

## 2014-09-26 DIAGNOSIS — Z9889 Other specified postprocedural states: Secondary | ICD-10-CM | POA: Diagnosis not present

## 2014-09-26 DIAGNOSIS — Z7982 Long term (current) use of aspirin: Secondary | ICD-10-CM | POA: Insufficient documentation

## 2014-09-26 DIAGNOSIS — Y92242 Post office as the place of occurrence of the external cause: Secondary | ICD-10-CM | POA: Diagnosis not present

## 2014-09-26 DIAGNOSIS — I25119 Atherosclerotic heart disease of native coronary artery with unspecified angina pectoris: Secondary | ICD-10-CM | POA: Diagnosis not present

## 2014-09-26 DIAGNOSIS — E785 Hyperlipidemia, unspecified: Secondary | ICD-10-CM | POA: Diagnosis not present

## 2014-09-26 DIAGNOSIS — S9032XA Contusion of left foot, initial encounter: Secondary | ICD-10-CM | POA: Insufficient documentation

## 2014-09-26 DIAGNOSIS — Z87891 Personal history of nicotine dependence: Secondary | ICD-10-CM | POA: Insufficient documentation

## 2014-09-26 DIAGNOSIS — Z79899 Other long term (current) drug therapy: Secondary | ICD-10-CM | POA: Insufficient documentation

## 2014-09-26 DIAGNOSIS — T148XXA Other injury of unspecified body region, initial encounter: Secondary | ICD-10-CM

## 2014-09-26 DIAGNOSIS — S99922A Unspecified injury of left foot, initial encounter: Secondary | ICD-10-CM | POA: Diagnosis present

## 2014-09-26 DIAGNOSIS — Y990 Civilian activity done for income or pay: Secondary | ICD-10-CM | POA: Insufficient documentation

## 2014-09-26 DIAGNOSIS — Z9119 Patient's noncompliance with other medical treatment and regimen: Secondary | ICD-10-CM | POA: Insufficient documentation

## 2014-09-26 DIAGNOSIS — Y9389 Activity, other specified: Secondary | ICD-10-CM | POA: Diagnosis not present

## 2014-09-26 DIAGNOSIS — Z8659 Personal history of other mental and behavioral disorders: Secondary | ICD-10-CM | POA: Insufficient documentation

## 2014-09-26 NOTE — ED Notes (Signed)
D/c home- employer's paperwork filled out by Dr Jeneen Rinks and returned to pt

## 2014-09-26 NOTE — ED Provider Notes (Signed)
CSN: [id number]     Arrival date & time 09/26/14  0913 History   First MD Initiated Contact with Patient 09/26/14 [phone]     Chief Complaint  Patient presents with  . foot injury at work       HPI  Impression presents evaluation of left foot pain. She was at work yesterday at the [employer]. She states that up 8-10 pound piece of metal fell through the bottom of a bag and struck her on her or some of her left foot she was wearing tennis shoes. States this morning is more painful than she thought it was going to be and she presents here. Does not notice any redness or swelling. Injuries to the left foot. She indicates the second third and fourth toes at the MTP  Past Medical History  Diagnosis Date  . CAD (coronary artery disease)     Remote PCI to RCA in 2001, stent to the RCA in 2002, with last cath in 2010 with placement of two overlapping drug eluting  stents to the RCA  . 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 Anxiety   . Depression   . Noncompliance   . PVD (peripheral vascular disease)     MILD  . H/O: substance abuse   . Hyperlipidemia   . Bence Jones proteinuria 02/29/2012    Random urine "M spike" 7.1% 02/13/12  . Anginal pain   . Myocardial infarction     "not sure; nobody ever tells me" (04/23/2012)   Past Surgical History  Procedure Laterality Date  . Coronary angioplasty with stent placement  2002; 2010; 04/23/2012    "1 + 2 + 1; total of 4"  . Tonsillectomy    . Excisional hemorrhoidectomy    . Anterior cruciate ligament repair      "have had 12 surgeries on my right ACL"  . Hernia repair      points to stomach  . Abdominal hysterectomy  1999  . Breast surgery      "removed knot from one side"  . Left heart catheterization with coronary angiogram N/A 04/23/2012    Procedure: LEFT HEART CATHETERIZATION WITH CORONARY ANGIOGRAM;  Surgeon: Burnell Blanks, MD;  Location: [facility] CATH LAB;  Service: Cardiovascular;  Laterality: N/A;   Family History    Problem Relation Age of Onset  . Hypertension Mother   . Lung cancer Mother   . Heart disease Father   . Hypertension Father   . Stroke Father   . Heart failure Father    History  Substance Use Topics  . Smoking status: Former Smoker -- 1.50 packs/day for 38 years    Types: Cigarettes    Quit date: 04/20/2012  . Smokeless tobacco: Never Used  . Alcohol Use: 8.4 oz/week    14 Cans of beer per week   OB History    No data available     Review of Systems  Musculoskeletal:       Pain of the left foot      Allergies  Review of patient's allergies indicates no known allergies.  Home Medications   Prior to Admission medications   Medication Sig Start Date End Date Taking? Authorizing Provider  acetaminophen (TYLENOL) 500 MG tablet Take 1,000 mg by mouth every 6 (six) hours as needed. For pain    Historical Provider, MD  aspirin 81 MG tablet Take 81 mg by mouth daily.      Historical Provider, MD  hydroxychloroquine (PLAQUENIL) 200  MG tablet Take 200 mg by mouth daily.     Historical Provider, MD  metoprolol succinate (TOPROL-XL) 25 MG 24 hr tablet TAKE 1 TABLET DAILY    Burnell Blanks, MD  nitroGLYCERIN (NITROSTAT) 0.4 MG SL tablet Place 1 tablet (0.4 mg total) under the tongue every 5 (five) minutes as needed. For chest pain 11/23/13   Burnell Blanks, MD  simvastatin (ZOCOR) 40 MG tablet Take 1 tablet (40 mg total) by mouth at bedtime. 11/23/13   Burnell Blanks, MD  zolpidem (AMBIEN) 10 MG tablet Take 10 mg by mouth at bedtime as needed. For sleep    Historical Provider, MD   BP 144/83 mmHg  Pulse 69  Temp(Src) 98 F (36.7 C) (Oral)  Resp 18  Wt 128 lb (58.06 kg)  SpO2 100% Physical Exam  Musculoskeletal:       Feet:  Patient indicates pain in the dorsum of the foot at the MTP the second third and fourth digit left foot no redness swelling. Full range of motion. Walks without antalgic gait.    ED Course  Procedures (including critical care  time) Labs Review Labs Reviewed - No data to display  Imaging Review No results found.   EKG Interpretation None      MDM   Final diagnoses:  Contusion    No fracture noted. No soft tissue swelling or erythema. No limitations to activity or work. Ice and Motrin as needed.    Tanna Furry, MD 09/26/14 [phone]

## 2014-09-26 NOTE — Discharge Instructions (Signed)
Ice and Motrin as needed. No limitation of work or activity.  Contusion A contusion is a deep bruise. Contusions happen when an injury causes bleeding under the skin. Signs of bruising include pain, puffiness (swelling), and discolored skin. The contusion may turn blue, purple, or yellow. HOME CARE   Put ice on the injured area.  Put ice in a plastic bag.  Place a towel between your skin and the bag.  Leave the ice on for 15-20 minutes, 03-04 times a day.  Only take medicine as told by your doctor.  Rest the injured area.  If possible, raise (elevate) the injured area to lessen puffiness. GET HELP RIGHT AWAY IF:   You have more bruising or puffiness.  You have pain that is getting worse.  Your puffiness or pain is not helped by medicine. MAKE SURE YOU:   Understand these instructions.  Will watch your condition.  Will get help right away if you are not doing well or get worse. Document Released: 12/19/2007 Document Revised: 09/24/2011 Document Reviewed: 05/07/2011 [facility] Patient Information 2015 [place], [place]. This information is not intended to replace advice given to you by your health care provider. Make sure you discuss any questions you have with your health care provider.

## 2014-09-26 NOTE — ED Notes (Addendum)
Patient reports that she dropped 8lb metal plate on left foot pain yesterday at work, pain more in toes. Pain with ambulation, no abrasions noted.

## 2014-09-28 ENCOUNTER — Other Ambulatory Visit: Payer: Self-pay | Admitting: Cardiovascular Disease

## 2014-12-03 ENCOUNTER — Ambulatory Visit (INDEPENDENT_AMBULATORY_CARE_PROVIDER_SITE_OTHER): Payer: Federal, State, Local not specified - PPO | Admitting: Cardiovascular Disease

## 2014-12-03 ENCOUNTER — Encounter: Payer: Self-pay | Admitting: Cardiovascular Disease

## 2014-12-03 ENCOUNTER — Other Ambulatory Visit: Payer: Self-pay

## 2014-12-03 VITALS — BP 118/82 | HR 60 | Ht 59.0 in | Wt 129.0 lb

## 2014-12-03 DIAGNOSIS — E785 Hyperlipidemia, unspecified: Secondary | ICD-10-CM

## 2014-12-03 DIAGNOSIS — R011 Cardiac murmur, unspecified: Secondary | ICD-10-CM | POA: Diagnosis not present

## 2014-12-03 DIAGNOSIS — I2510 Atherosclerotic heart disease of native coronary artery without angina pectoris: Secondary | ICD-10-CM

## 2014-12-03 DIAGNOSIS — Z01810 Encounter for preprocedural cardiovascular examination: Secondary | ICD-10-CM | POA: Diagnosis not present

## 2014-12-03 DIAGNOSIS — F17201 Nicotine dependence, unspecified, in remission: Secondary | ICD-10-CM | POA: Diagnosis not present

## 2014-12-03 NOTE — Patient Instructions (Addendum)
Medication Instructions:  Your physician recommends that you continue on your current medications as directed. Please refer to the Current Medication list given to you today.   Labwork: Your physician recommends that you return for fasting lab work.--BMP, Lipid and liver profiles   Testing/Procedures:  Your physician has requested that you have an echocardiogram. Echocardiography is a painless test that uses sound waves to create images of your heart. It provides your doctor with information about the size and shape of your heart and how well your heart's chambers and valves are working. This procedure takes approximately one hour. There are no restrictions for this procedure.   Follow-Up:  Your physician wants you to follow-up in: 12 months.  You will receive a reminder letter in the mail two months in advance. If you don't receive a letter, please call our office to schedule the follow-up appointment.

## 2014-12-03 NOTE — Progress Notes (Signed)
Chief Complaint  Patient presents with  . Follow-up    History of Present Illness: 59 yo female with history of CAD with prior PCI of the RCA with stenting of the RCA in 2002/2010 and stenting of the Circumflex in 2013, former tobacco abuse, cutaneous lupus, anxiety, depression, HLD, PAD here today for cardiac follow up. She is a former patient of Dr. Susa Simmonds. She has known CAD with prior PCI of the RCA followed by stenting of the RCA in 2002. Cath was in 2010 and showed that her RCA was subtotally occluded distally. She then had two overlapping drug eluting stents placed in the distal RCA. She has a total of 3 stents in the RCA. LV function was normal. She does have moderate disease in the LCX with minimal in the LAD. She has been managed medically. She was seen by Truitt Merle 1004/13 and had c/o chest pressure. Stress test was arranged and showed possible lateral wall ischemia. She was brought in for a cath on 04/23/12 and was found to have severe stenosis obtuse marginal treated with a bare metal stent.   She is here for follow up and is doing well. No chest pain or SOB. She has stopped smoking completely. She has seen Dr. Melvern Sample at [facility] and has plans for arthroscopic knee surgery this summer. She is limited in activity by knee pain.   Primary Care Physician: Shirline Frees  Rheumatology: Berna Bue    Past Medical History  Diagnosis Date  . CAD (coronary artery disease)     Remote PCI to RCA in 2001, stent to the RCA in 2002, with last cath in 2010 with placement of two overlapping drug eluting  stents to the RCA  . 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 Anxiety   . Depression   . Noncompliance   . PVD (peripheral vascular disease)     MILD  . H/O: substance abuse   . Hyperlipidemia   . Bence Jones proteinuria 02/29/2012    Random urine "M spike" 7.1% 02/13/12  . Anginal pain   . Myocardial infarction     "not sure; nobody ever tells me" (04/23/2012)    Past  Surgical History  Procedure Laterality Date  . Coronary angioplasty with stent placement  2002; 2010; 04/23/2012    "1 + 2 + 1; total of 4"  . Tonsillectomy    . Excisional hemorrhoidectomy    . Anterior cruciate ligament repair      "have had 12 surgeries on my right ACL"  . Hernia repair      points to stomach  . Abdominal hysterectomy  1999  . Breast surgery      "removed knot from one side"  . Left heart catheterization with coronary angiogram N/A 04/23/2012    Procedure: LEFT HEART CATHETERIZATION WITH CORONARY ANGIOGRAM;  Surgeon: Burnell Blanks, MD;  Location: [facility] CATH LAB;  Service: Cardiovascular;  Laterality: N/A;    Current Outpatient Prescriptions  Medication Sig Dispense Refill  . acetaminophen (TYLENOL) 500 MG tablet Take 1,000 mg by mouth every 6 (six) hours as needed. For pain    . aspirin 81 MG tablet Take 81 mg by mouth daily.      . hydroxychloroquine (PLAQUENIL) 200 MG tablet Take 400 mg by mouth daily.     . metoprolol succinate (TOPROL-XL) 25 MG 24 hr tablet TAKE 1 TABLET DAILY (Patient taking differently: TAKE 1 TABLET BY MOUTH DAILY) 90 tablet 0  . nitroGLYCERIN (NITROSTAT)  0.4 MG SL tablet Place 1 tablet (0.4 mg total) under the tongue every 5 (five) minutes as needed. For chest pain 25 tablet 6  . prednisoLONE acetate (PRED FORTE) 1 % ophthalmic suspension Place 1 drop into both eyes daily as needed. Flare-ups    . simvastatin (ZOCOR) 40 MG tablet Take 1 tablet (40 mg total) by mouth at bedtime. 90 tablet 3  . zolpidem (AMBIEN) 10 MG tablet Take 10 mg by mouth at bedtime as needed. For sleep     No current facility-administered medications for this visit.    No Known Allergies  History   Social History  . Marital Status: Single    Spouse Name: N/A  . Number of Children: N/A  . Years of Education: N/A   Occupational History  . Not on file.   Social History Main Topics  . Smoking status: Former Smoker -- 1.50 packs/day for 38 years    Types:  Cigarettes    Quit date: 04/20/2012  . Smokeless tobacco: Never Used  . Alcohol Use: 8.4 oz/week    14 Cans of beer per week  . Drug Use: Yes    Special: Marijuana     Comment: 04/23/2012 "last marijuana last Friday; puff or 2"  . Sexual Activity: No   Other Topics Concern  . Not on file   Social History Narrative    Family History  Problem Relation Age of Onset  . Hypertension Mother   . Lung cancer Mother   . Heart disease Father   . Hypertension Father   . Stroke Father   . Heart failure Father     Review of Systems:  As stated in the HPI and otherwise negative.   BP 118/82 mmHg  Pulse 60  Ht 4\' 11"  (1.499 m)  Wt 129 lb (58.514 kg)  BMI 26.04 kg/m2  Physical Examination: General: Well developed, well nourished, NAD HEENT: OP clear, mucus membranes moist SKIN: warm, dry. No rashes. Neuro: No focal deficits Musculoskeletal: Muscle strength 5/5 all ext Psychiatric: Mood and affect normal Neck: No JVD, no carotid bruits, no thyromegaly, no lymphadenopathy. Lungs:Clear bilaterally, no wheezes, rhonci, crackles Cardiovascular: Regular rate and rhythm. No murmurs, gallops or rubs. Abdomen:Soft. Bowel sounds present. Non-tender.  Extremities: No lower extremity edema. Pulses are 2 + in the bilateral DP/PT.  Cardiac cath 04/23/12:  Left main: No obstructive disease.  Left Anterior Descending Artery: Large caliber vessel that courses to the apex. There is a 20% stenosis proximally. The diagonal branch is patent.  Intermediate branch: Moderate sized. No disease noted.  Circumflex Artery: Moderate caliber vessel with a long area of 50% stenosis proximally followed by a 99% stenosis leading into the large OM branch. The continuation of the AV groove Circumflex is occluded beyond the marginal branch and fills from right to left collaterals, unchanged from last cath.  Right Coronary Artery: Large, dominant vessel with diffuse 30% stenosis in the mid vessel with patent  overlapping stents in the distal vessel. There is minimal in-stent restenosis. The distal AV groove Circumflex is seen to fill from right to left collaterals.  Left Ventricular Angiogram: LVEF=50%  Impression:  1. Double vessel CAD with patent stents RCA and severe stenosis in the mid Circumflex extending into the first OM branch.  2. Low normal LV systolic function  3. Unstable angina  4. Successful PTCA/bare metal stent x 1.  EKG:  EKG is ordered today. The ekg ordered today demonstrates NSR, rate 60 bpm. Chronic T wave inversions inferior  leads. Subtle chronic ST elevation lateral leads.   Recent Labs: No results found for requested labs within last 365 days.   Lipid Panel    Component Value Date/Time   CHOL 110 05/27/2013 1040   TRIG 79.0 05/27/2013 1040   HDL 41.80 05/27/2013 1040   CHOLHDL 3 05/27/2013 1040   VLDL 15.8 05/27/2013 1040   LDLCALC 52 05/27/2013 1040     Wt Readings from Last 3 Encounters:  12/03/14 129 lb (58.514 kg)  09/26/14 128 lb (58.06 kg)  11/23/13 129 lb (58.514 kg)     Other studies Reviewed: Additional studies/ records that were reviewed today include: Review of the above records demonstrates:   Assessment and Plan:   1. CAD: Stable. Cath 04/23/12 with severe stenosis Circumflex treated with bare metal stent. No recent chest pains. Continue ASA 81 mg po Qdaily, beta blocker and statin.   2. Tobacco abuse, in remission: She has stopped smoking.   3. Hyperlipidemia: She is on simvastatin 40 mg daily. Lipids controlled.    4. Pre-operative cardiovascular examination: She is doing well. Can proceed with her planned surgery without ischemic testing. OK to hold ASA 7 days before her surgery  5. Cardiac murmur, systolic: Will arrange echo to assess  Current medicines are reviewed at length with the patient today.  The patient does not have concerns regarding medicines.  The following changes have been made:  no change  Labs/ tests ordered today  include:   Orders Placed This Encounter  Procedures  . Lipid Profile  . Hepatic function panel  . Basic Metabolic Panel (BMET)  . EKG 12-Lead  . Echocardiogram    Disposition:   FU with me in 6 months  Signed, Lauree Chandler, MD 12/03/2014 1:24 PM    [facility] [address], [place], [place]  [address] Phone: [phone]; Fax: [phone]

## 2014-12-06 ENCOUNTER — Other Ambulatory Visit (INDEPENDENT_AMBULATORY_CARE_PROVIDER_SITE_OTHER): Payer: Federal, State, Local not specified - PPO | Admitting: *Deleted

## 2014-12-06 ENCOUNTER — Other Ambulatory Visit: Payer: Self-pay | Admitting: *Deleted

## 2014-12-06 DIAGNOSIS — E785 Hyperlipidemia, unspecified: Secondary | ICD-10-CM

## 2014-12-06 DIAGNOSIS — I2510 Atherosclerotic heart disease of native coronary artery without angina pectoris: Secondary | ICD-10-CM | POA: Diagnosis not present

## 2014-12-06 LAB — HEPATIC FUNCTION PANEL
ALT: 19 U/L (ref 0–35)
AST: 19 U/L (ref 0–37)
Albumin: 4.2 g/dL (ref 3.5–5.2)
Alkaline Phosphatase: 60 U/L (ref 39–117)
BILIRUBIN DIRECT: 0 mg/dL (ref 0.0–0.3)
BILIRUBIN TOTAL: 0.2 mg/dL (ref 0.2–1.2)
TOTAL PROTEIN: 6.7 g/dL (ref 6.0–8.3)

## 2014-12-06 LAB — BASIC METABOLIC PANEL
BUN: 13 mg/dL (ref 6–23)
CO2: 28 meq/L (ref 19–32)
Calcium: 9.3 mg/dL (ref 8.4–10.5)
Chloride: 108 mEq/L (ref 96–112)
Creatinine, Ser: 0.71 mg/dL (ref 0.40–1.20)
GFR: 109.3 mL/min (ref >60.00)
GLUCOSE: 125 mg/dL — AB (ref 70–99)
POTASSIUM: 4.2 meq/L (ref 3.5–5.1)
Sodium: 140 mEq/L (ref 135–145)

## 2014-12-06 LAB — LIPID PANEL
CHOL/HDL RATIO: 4
Cholesterol: 192 mg/dL (ref 0–200)
HDL: 51.7 mg/dL (ref >39.00)
LDL CALC: 125 mg/dL — AB (ref 0–99)
NonHDL: 140.3
Triglycerides: 75 mg/dL (ref 0.0–149.0)
VLDL: 15 mg/dL (ref 0.0–40.0)

## 2014-12-06 MED ORDER — ATORVASTATIN CALCIUM 40 MG PO TABS: 40.0000 mg | ORAL_TABLET | Freq: Every day | ORAL | Status: DC

## 2014-12-06 NOTE — Addendum Note (Signed)
Addended by: Eulis Foster on: 12/06/2014 09:03 AM   Modules accepted: Orders

## 2014-12-10 ENCOUNTER — Other Ambulatory Visit (HOSPITAL_COMMUNITY): Payer: Self-pay

## 2015-01-25 ENCOUNTER — Telehealth: Payer: Self-pay | Admitting: Cardiovascular Disease

## 2015-01-25 NOTE — Telephone Encounter (Signed)
Spoke with pt and told her to stop ASA 81 mg while taking ASA 325 mg. I told her to resume 81 mg daily when course of 325 mg completed.

## 2015-01-25 NOTE — Telephone Encounter (Signed)
New message     Pt had bone graft done yesterday, along with repairing tear in knee.  Doctor who performed procedure wants her to take aspirin 325  mg 1 time a day for 2 weeks to prevent blood clot and take tylenol 500 mg every 6 hours for 3 days after surgery.   Pt was also given oxycodone 5 mg tablet, take 1 or 2 tablets every 4 to 6 hours for post op pain.   Pt needs to verify it is ok to take these medications since she is already taking baby aspirin 81mg .  Please call to advise

## 2015-03-01 ENCOUNTER — Other Ambulatory Visit (INDEPENDENT_AMBULATORY_CARE_PROVIDER_SITE_OTHER): Payer: Federal, State, Local not specified - PPO | Admitting: *Deleted

## 2015-03-01 ENCOUNTER — Encounter: Payer: Self-pay | Admitting: Cardiology

## 2015-03-01 ENCOUNTER — Telehealth: Payer: Self-pay | Admitting: Cardiovascular Disease

## 2015-03-01 ENCOUNTER — Ambulatory Visit (INDEPENDENT_AMBULATORY_CARE_PROVIDER_SITE_OTHER): Payer: Federal, State, Local not specified - PPO | Admitting: Cardiology

## 2015-03-01 ENCOUNTER — Telehealth: Payer: Self-pay

## 2015-03-01 VITALS — BP 116/90 | HR 79 | Ht 59.0 in | Wt 128.5 lb

## 2015-03-01 DIAGNOSIS — R0789 Other chest pain: Secondary | ICD-10-CM | POA: Diagnosis not present

## 2015-03-01 DIAGNOSIS — E785 Hyperlipidemia, unspecified: Secondary | ICD-10-CM

## 2015-03-01 DIAGNOSIS — R079 Chest pain, unspecified: Secondary | ICD-10-CM

## 2015-03-01 DIAGNOSIS — F419 Anxiety disorder, unspecified: Secondary | ICD-10-CM

## 2015-03-01 DIAGNOSIS — I2583 Coronary atherosclerosis due to lipid rich plaque: Secondary | ICD-10-CM

## 2015-03-01 DIAGNOSIS — I2510 Atherosclerotic heart disease of native coronary artery without angina pectoris: Secondary | ICD-10-CM

## 2015-03-01 DIAGNOSIS — I10 Essential (primary) hypertension: Secondary | ICD-10-CM

## 2015-03-01 LAB — LIPID PANEL
CHOL/HDL RATIO: 3
Cholesterol: 165 mg/dL (ref 0–200)
HDL: 48.2 mg/dL (ref >39.00)
LDL Cholesterol: 93 mg/dL (ref 0–99)
NonHDL: 116.76
TRIGLYCERIDES: 119 mg/dL (ref 0.0–149.0)
VLDL: 23.8 mg/dL (ref 0.0–40.0)

## 2015-03-01 LAB — HEPATIC FUNCTION PANEL
ALT: 20 U/L (ref 0–35)
AST: 18 U/L (ref 0–37)
Albumin: 4.2 g/dL (ref 3.5–5.2)
Alkaline Phosphatase: 59 U/L (ref 39–117)
BILIRUBIN DIRECT: 0.1 mg/dL (ref 0.0–0.3)
Total Bilirubin: 0.4 mg/dL (ref 0.2–1.2)
Total Protein: 6.6 g/dL (ref 6.0–8.3)

## 2015-03-01 NOTE — Telephone Encounter (Signed)
Patient was in this am for scheduled lab work. She then told the receptionist she had a medication question. Upon speaking with her, she began talking about upper chest tightness she was having. Not presently having pain, but when she does, it radiates up to her neck and down under left breast. Is concerned about this due to her history of CAD. Spoke with Dr. Marlou Porch, DOD, who said we could work her in. Advised patient she could be seen at 9:45 this am.

## 2015-03-01 NOTE — Telephone Encounter (Signed)
Spoke with pt and reviewed lab results with her.

## 2015-03-01 NOTE — Patient Instructions (Signed)
Medication Instructions:  Your physician recommends that you continue on your current medications as directed. Please refer to the Current Medication list given to you today.  Testing/Procedures: Your physician has requested that you have a lexiscan myoview. For further information please visit [email or web address]. Please follow instruction sheet, as given.  Your physician has requested that you have an echocardiogram. Echocardiography is a painless test that uses sound waves to create images of your heart. It provides your doctor with information about the size and shape of your heart and how well your heart's chambers and valves are working. This procedure takes approximately one hour. There are no restrictions for this procedure.  Follow-Up: Follow up as scheduled with Dr Angelena Form.  Thank you for choosing [facility]!!

## 2015-03-01 NOTE — Telephone Encounter (Signed)
New message ° ° ° ° °Pt returning your call °

## 2015-03-01 NOTE — Progress Notes (Addendum)
History of Present Illness: 59 yo female with history of CAD with prior PCI of the RCA with stenting of the RCA in 2002/2010 and stenting of the Circumflex in 2013, former tobacco abuse, cutaneous lupus, anxiety, depression, HLD, PAD here today for  Evaluation of chest pain.   She is a former patient of Dr. Susa Simmonds. She has known CAD with prior PCI of the RCA followed by stenting of the RCA in 2002. Cath was in 2010 and showed that her RCA was subtotally occluded distally. She then had two overlapping drug eluting stents placed in the distal RCA. She has a total of 3 stents in the RCA. LV function was normal. She does have moderate disease in the LCX with minimal in the LAD. She has been managed medically. She was seen by Truitt Merle 1004/13 and had c/o chest pressure. Stress test was arranged and showed possible lateral wall ischemia. She was brought in for a cath on 04/23/12 and was found to have severe stenosis obtuse marginal treated with a bare metal stent.   She walked in today as a nurse visit after checking lab and was concerned about her chest discomfort. She has stopped smoking completely. She has seen Dr. Melvern Sample at [facility] and has had arthroscopic knee surgery. She is limited in activity by knee pain.   Does not know if anxiety is provoking pain. Stress with workman's comp. Leg surgery. Feels like a tightness in central chest with neck and under left breast. Periodic, not necessarily exertional. She is not having any chest discomfort currently. This seems to come about with stress/anxiety.  Primary Care Physician: Shirline Frees  Rheumatology: Berna Bue    Past Medical History  Diagnosis Date  . CAD (coronary artery disease)     Remote PCI to RCA in 2001, stent to the RCA in 2002, with last cath in 2010 with placement of two overlapping drug eluting  stents to the RCA  . 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 Anxiety   . Depression   . Noncompliance   . PVD  (peripheral vascular disease)     MILD  . H/O: substance abuse   . Hyperlipidemia   . Bence Jones proteinuria 02/29/2012    Random urine "M spike" 7.1% 02/13/12  . Anginal pain   . Myocardial infarction     "not sure; nobody ever tells me" (04/23/2012)    Past Surgical History  Procedure Laterality Date  . Coronary angioplasty with stent placement  2002; 2010; 04/23/2012    "1 + 2 + 1; total of 4"  . Tonsillectomy    . Excisional hemorrhoidectomy    . Anterior cruciate ligament repair      "have had 12 surgeries on my right ACL"  . Hernia repair      points to stomach  . Abdominal hysterectomy  1999  . Breast surgery      "removed knot from one side"  . Left heart catheterization with coronary angiogram N/A 04/23/2012    Procedure: LEFT HEART CATHETERIZATION WITH CORONARY ANGIOGRAM;  Surgeon: Burnell Blanks, MD;  Location: [facility] CATH LAB;  Service: Cardiovascular;  Laterality: N/A;    Current Outpatient Prescriptions  Medication Sig Dispense Refill  . acetaminophen (TYLENOL) 500 MG tablet Take 1,000 mg by mouth every 6 (six) hours as needed. For pain    . aspirin 81 MG tablet Take 81 mg by mouth daily.      Marland Kitchen atorvastatin (LIPITOR) 40  MG tablet Take 1 tablet (40 mg total) by mouth daily. 90 tablet 3  . HYDROcodone-acetaminophen (NORCO/VICODIN) 5-325 MG per tablet Take by mouth.    . hydroxychloroquine (PLAQUENIL) 200 MG tablet Take 400 mg by mouth daily.     . metoprolol succinate (TOPROL-XL) 25 MG 24 hr tablet TAKE 1 TABLET DAILY (Patient taking differently: TAKE 1 TABLET BY MOUTH DAILY) 90 tablet 0  . nitroGLYCERIN (NITROSTAT) 0.4 MG SL tablet Place 1 tablet (0.4 mg total) under the tongue every 5 (five) minutes as needed. For chest pain 25 tablet 6  . zolpidem (AMBIEN) 10 MG tablet Take 10 mg by mouth at bedtime as needed. For sleep     No current facility-administered medications for this visit.    No Known Allergies  Social History   Social History  . Marital  Status: Single    Spouse Name: N/A  . Number of Children: N/A  . Years of Education: N/A   Occupational History  . Not on file.   Social History Main Topics  . Smoking status: Former Smoker -- 1.50 packs/day for 38 years    Types: Cigarettes    Quit date: 04/20/2012  . Smokeless tobacco: Never Used  . Alcohol Use: 8.4 oz/week    14 Cans of beer per week  . Drug Use: Yes    Special: Marijuana     Comment: 04/23/2012 "last marijuana last Friday; puff or 2"  . Sexual Activity: No   Other Topics Concern  . Not on file   Social History Narrative    Family History  Problem Relation Age of Onset  . Hypertension Mother   . Lung cancer Mother   . Heart disease Father   . Hypertension Father   . Stroke Father   . Heart failure Father     Review of Systems:  As stated in the HPI and otherwise negative.   BP 116/90 mmHg  Pulse 79  Ht 4\' 11"  (1.499 m)  Wt 128 lb 8 oz (58.287 kg)  BMI 25.94 kg/m2  SpO2 98%  Physical Examination: General: Well developed, well nourished, NAD HEENT: OP clear, mucus membranes moist SKIN: warm, dry. No rashes. Neuro: No focal deficits Musculoskeletal: Muscle strength 5/5 all ext Psychiatric: Mood and affect normal Neck: No JVD, no carotid bruits, no thyromegaly, no lymphadenopathy. Lungs:Clear bilaterally, no wheezes, rhonci, crackles Cardiovascular: Regular rate and rhythm. 2/6 systolic, left upper sternal border murmur, no gallops or rubs. Abdomen:Soft. Bowel sounds present. Non-tender.  Extremities: No lower extremity edema. Pulses are 2 + in the bilateral DP/PT.  Cardiac cath 04/23/12:  Left main: No obstructive disease.  Left Anterior Descending Artery: Large caliber vessel that courses to the apex. There is a 20% stenosis proximally. The diagonal branch is patent.  Intermediate branch: Moderate sized. No disease noted.  Circumflex Artery: Moderate caliber vessel with a long area of 50% stenosis proximally followed by a 99% stenosis  leading into the large OM branch. The continuation of the AV groove Circumflex is occluded beyond the marginal branch and fills from right to left collaterals, unchanged from last cath.  Right Coronary Artery: Large, dominant vessel with diffuse 30% stenosis in the mid vessel with patent overlapping stents in the distal vessel. There is minimal in-stent restenosis. The distal AV groove Circumflex is seen to fill from right to left collaterals.  Left Ventricular Angiogram: LVEF=50%  Impression:  1. Double vessel CAD with patent stents RCA and severe stenosis in the mid Circumflex extending into the  first OM branch.  2. Low normal LV systolic function  3. Unstable angina  4. Successful PTCA/bare metal stent x 1.  EKG:  EKG is ordered today. The ekg ordered today 03/01/15 demonstrates NSR, rate 60 bpm. Chronic T wave inversions inferior leads, ST segment depression. Subtle chronic ST elevation lateral leads. Unchanged from prior personally viewed  Recent Labs: 12/06/2014: ALT 19; BUN 13; Creatinine, Ser 0.71; Potassium 4.2; Sodium 140   Lipid Panel    Component Value Date/Time   CHOL 192 12/06/2014 0903   TRIG 75.0 12/06/2014 0903   HDL 51.70 12/06/2014 0903   CHOLHDL 4 12/06/2014 0903   VLDL 15.0 12/06/2014 0903   LDLCALC 125* 12/06/2014 0903     Wt Readings from Last 3 Encounters:  03/01/15 128 lb 8 oz (58.287 kg)  12/03/14 129 lb (58.514 kg)  09/26/14 128 lb (58.06 kg)     Other studies Reviewed: Additional studies/ records that were reviewed today include: Review of the above records demonstrates:   Assessment and Plan:   1. CAD:  Cath 04/23/12 with severe stenosis Circumflex treated with bare metal stent. With recent chest discomfort which she things may be attributable to anxiety or stress, we will go ahead and set up for a nuclear stress test to evaluate for ischemia. Once again, prior stress test resulted in ischemia lateral wall which prompted cardiac catheterization and  circumflex stent placement by Dr. Burt Knack.. Continue ASA 81 mg po Qdaily, beta blocker and statin. I'm fine with her taking nitroglycerin as needed. If symptoms worsen or become more worrisome, she should seek medical attention.  2. Tobacco abuse, in remission: She has stopped smoking. Excellent job  3. Hyperlipidemia: She is on atorvastatin 40 mg daily. Lipids controlled.  Lipids not back yet from lab. We will call with results. Previous LDL 125. Goal is less than 70. When on Vytorin she was 52.   4. Cardiac murmur, systolic: Will arrange echo to assess  5. Recent knee surgery-she states that she's had over 12 knee procedures. She tolerated this well.  Current medicines are reviewed at length with the patient today.  The patient does not have concerns regarding medicines.  The following changes have been made:  no change  Labs/ tests ordered today include:   Orders Placed This Encounter  Procedures  . Myocardial Perfusion Imaging  . EKG 12-Lead    Disposition:   FU with Dr. Julianne Handler in 6 months  Signed, Candee Furbish, MD   03/01/2015 10:21 AM    [facility] [address], [place], [place]  [address] Phone: [phone]; Fax: [phone]

## 2015-03-07 ENCOUNTER — Telehealth (HOSPITAL_COMMUNITY): Payer: Self-pay

## 2015-03-07 NOTE — Telephone Encounter (Signed)
Encounter complete. 

## 2015-03-09 ENCOUNTER — Other Ambulatory Visit: Payer: Self-pay

## 2015-03-09 ENCOUNTER — Ambulatory Visit (HOSPITAL_COMMUNITY): Payer: Federal, State, Local not specified - PPO | Attending: Cardiology

## 2015-03-09 ENCOUNTER — Ambulatory Visit (HOSPITAL_BASED_OUTPATIENT_CLINIC_OR_DEPARTMENT_OTHER): Payer: Federal, State, Local not specified - PPO

## 2015-03-09 DIAGNOSIS — Z8249 Family history of ischemic heart disease and other diseases of the circulatory system: Secondary | ICD-10-CM | POA: Insufficient documentation

## 2015-03-09 DIAGNOSIS — R079 Chest pain, unspecified: Secondary | ICD-10-CM

## 2015-03-09 DIAGNOSIS — I10 Essential (primary) hypertension: Secondary | ICD-10-CM | POA: Diagnosis not present

## 2015-03-09 DIAGNOSIS — E785 Hyperlipidemia, unspecified: Secondary | ICD-10-CM | POA: Insufficient documentation

## 2015-03-09 DIAGNOSIS — I259 Chronic ischemic heart disease, unspecified: Secondary | ICD-10-CM | POA: Diagnosis not present

## 2015-03-09 DIAGNOSIS — F419 Anxiety disorder, unspecified: Secondary | ICD-10-CM | POA: Diagnosis not present

## 2015-03-09 MED ORDER — TECHNETIUM TC 99M SESTAMIBI GENERIC - CARDIOLITE
30.4000 | Freq: Once | INTRAVENOUS | Status: AC | PRN
  Administered 2015-03-09: 30 via INTRAVENOUS

## 2015-03-09 MED ORDER — REGADENOSON 0.4 MG/5ML IV SOLN
0.4000 mg | Freq: Once | INTRAVENOUS | Status: AC
  Administered 2015-03-09: 0.4 mg via INTRAVENOUS

## 2015-03-09 MED ORDER — TECHNETIUM TC 99M SESTAMIBI GENERIC - CARDIOLITE
10.2000 | Freq: Once | INTRAVENOUS | Status: AC | PRN
  Administered 2015-03-09: 10 via INTRAVENOUS

## 2015-03-10 LAB — MYOCARDIAL PERFUSION IMAGING
CHL CUP NUCLEAR SRS: 2
CHL CUP NUCLEAR SSS: 3
CSEPPHR: 96 {beats}/min
LV dias vol: 59 mL
LVSYSVOL: 22 mL
RATE: 0.26
Rest HR: 52 {beats}/min
SDS: 1
TID: 0.97

## 2015-03-18 ENCOUNTER — Other Ambulatory Visit: Payer: Self-pay | Admitting: Cardiovascular Disease

## 2015-03-18 MED ORDER — METOPROLOL SUCCINATE ER 25 MG PO TB24: 25.0000 mg | ORAL_TABLET | Freq: Every day | ORAL | Status: DC

## 2015-05-01 ENCOUNTER — Other Ambulatory Visit: Payer: Self-pay | Admitting: Cardiovascular Disease

## 2015-08-01 ENCOUNTER — Telehealth: Payer: Self-pay | Admitting: Cardiovascular Disease

## 2015-08-01 ENCOUNTER — Other Ambulatory Visit: Payer: Self-pay | Admitting: *Deleted

## 2015-08-01 MED ORDER — METOPROLOL SUCCINATE ER 25 MG PO TB24: 25.0000 mg | ORAL_TABLET | Freq: Every day | ORAL | Status: DC

## 2015-08-01 NOTE — Telephone Encounter (Signed)
Rx sent in

## 2015-08-01 NOTE — Telephone Encounter (Signed)
New message       *STAT* If patient is at the pharmacy, call can be transferred to refill team.   1. Which medications need to be refilled? (please list name of each medication and dose if known) metoprolol 25mg   2. Which pharmacy/location (including street and city if local pharmacy) is medication to be sent to? [email or web address] [address] 3. Do they need a 30 day or 90 day supply? 10 pills-------pt has been out of rx for 2 days waiting on mail order pharmacy

## 2015-10-21 DIAGNOSIS — L930 Discoid lupus erythematosus: Secondary | ICD-10-CM | POA: Diagnosis not present

## 2015-10-31 DIAGNOSIS — K080 Exfoliation of teeth due to systemic causes: Secondary | ICD-10-CM | POA: Diagnosis not present

## 2015-11-09 DIAGNOSIS — E782 Mixed hyperlipidemia: Secondary | ICD-10-CM | POA: Diagnosis not present

## 2015-11-09 DIAGNOSIS — I10 Essential (primary) hypertension: Secondary | ICD-10-CM | POA: Diagnosis not present

## 2015-11-09 DIAGNOSIS — E559 Vitamin D deficiency, unspecified: Secondary | ICD-10-CM | POA: Diagnosis not present

## 2015-11-09 DIAGNOSIS — E119 Type 2 diabetes mellitus without complications: Secondary | ICD-10-CM | POA: Diagnosis not present

## 2015-11-09 DIAGNOSIS — G4700 Insomnia, unspecified: Secondary | ICD-10-CM | POA: Diagnosis not present

## 2015-11-18 DIAGNOSIS — L930 Discoid lupus erythematosus: Secondary | ICD-10-CM | POA: Diagnosis not present

## 2015-11-18 DIAGNOSIS — L819 Disorder of pigmentation, unspecified: Secondary | ICD-10-CM | POA: Diagnosis not present

## 2015-11-18 DIAGNOSIS — L309 Dermatitis, unspecified: Secondary | ICD-10-CM | POA: Diagnosis not present

## 2015-11-27 ENCOUNTER — Other Ambulatory Visit: Payer: Self-pay | Admitting: Cardiovascular Disease

## 2015-12-13 DIAGNOSIS — R350 Frequency of micturition: Secondary | ICD-10-CM | POA: Diagnosis not present

## 2015-12-30 DIAGNOSIS — L930 Discoid lupus erythematosus: Secondary | ICD-10-CM | POA: Diagnosis not present

## 2016-02-07 DIAGNOSIS — L930 Discoid lupus erythematosus: Secondary | ICD-10-CM | POA: Diagnosis not present

## 2016-03-06 DIAGNOSIS — K080 Exfoliation of teeth due to systemic causes: Secondary | ICD-10-CM | POA: Diagnosis not present

## 2016-03-13 ENCOUNTER — Encounter (INDEPENDENT_AMBULATORY_CARE_PROVIDER_SITE_OTHER): Payer: Self-pay

## 2016-03-13 ENCOUNTER — Ambulatory Visit (INDEPENDENT_AMBULATORY_CARE_PROVIDER_SITE_OTHER): Payer: Federal, State, Local not specified - PPO | Admitting: Cardiovascular Disease

## 2016-03-13 ENCOUNTER — Encounter: Payer: Self-pay | Admitting: *Deleted

## 2016-03-13 ENCOUNTER — Encounter: Payer: Self-pay | Admitting: Cardiovascular Disease

## 2016-03-13 VITALS — BP 112/82 | HR 55 | Ht 59.0 in | Wt 131.4 lb

## 2016-03-13 DIAGNOSIS — E785 Hyperlipidemia, unspecified: Secondary | ICD-10-CM | POA: Diagnosis not present

## 2016-03-13 DIAGNOSIS — F17201 Nicotine dependence, unspecified, in remission: Secondary | ICD-10-CM | POA: Diagnosis not present

## 2016-03-13 DIAGNOSIS — I2510 Atherosclerotic heart disease of native coronary artery without angina pectoris: Secondary | ICD-10-CM

## 2016-03-13 LAB — HEPATIC FUNCTION PANEL
ALK PHOS: 62 U/L (ref 33–130)
ALT: 19 U/L (ref 6–29)
AST: 17 U/L (ref 10–35)
Albumin: 4.3 g/dL (ref 3.6–5.1)
BILIRUBIN DIRECT: 0.1 mg/dL (ref <=0.2)
BILIRUBIN TOTAL: 0.4 mg/dL (ref 0.2–1.2)
Indirect Bilirubin: 0.3 mg/dL (ref 0.2–1.2)
Total Protein: 6.6 g/dL (ref 6.1–8.1)

## 2016-03-13 LAB — LIPID PANEL
Cholesterol: 157 mg/dL (ref 125–200)
HDL: 48 mg/dL (ref >=46)
LDL Cholesterol: 94 mg/dL (ref <130)
Total CHOL/HDL Ratio: 3.3 Ratio (ref <=5.0)
Triglycerides: 76 mg/dL (ref <150)
VLDL: 15 mg/dL (ref <30)

## 2016-03-13 MED ORDER — NITROGLYCERIN 0.4 MG SL SUBL: SUBLINGUAL_TABLET | SUBLINGUAL | 3 refills | Status: DC

## 2016-03-13 MED ORDER — METOPROLOL SUCCINATE ER 25 MG PO TB24: 25.0000 mg | ORAL_TABLET | Freq: Every day | ORAL | 3 refills | Status: DC

## 2016-03-13 MED ORDER — ATORVASTATIN CALCIUM 40 MG PO TABS: 40.0000 mg | ORAL_TABLET | Freq: Every day | ORAL | 3 refills | Status: DC

## 2016-03-13 NOTE — Patient Instructions (Signed)
Medication Instructions:  Your physician recommends that you continue on your current medications as directed. Please refer to the Current Medication list given to you today.   Labwork: Lab work to be done today--lipid and liver profiles.   Testing/Procedures: none  Follow-Up: Your physician wants you to follow-up in: 12 months.  You will receive a reminder letter in the mail two months in advance. If you don't receive a letter, please call our office to schedule the follow-up appointment.    Any Other Special Instructions Will Be Listed Below (If Applicable).     If you need a refill on your cardiac medications before your next appointment, please call your pharmacy.

## 2016-03-13 NOTE — Progress Notes (Signed)
Chief Complaint  Patient presents with  . Fatigue    History of Present Illness: 60 yo female with history of CAD with prior PCI of the RCA with stenting of the RCA in 2002/2010 and stenting of the Circumflex in 2013, former tobacco abuse, cutaneous lupus, anxiety, depression, HLD, PAD here today for cardiac follow up. She is a former patient of Dr. Susa Simmonds. She has known CAD with prior PCI of the RCA followed by stenting of the RCA in 2002. Cath was in 2010 and showed that her RCA was subtotally occluded distally. She then had two overlapping drug eluting stents placed in the distal RCA. She has a total of 3 stents in the RCA. LV function was normal. She does have moderate disease in the LCX with minimal in the LAD. She has been managed medically. She was seen by Truitt Merle 1004/13 and had c/o chest pressure. Stress test was arranged and showed possible lateral wall ischemia. She was brought in for a cath on 04/23/12 and was found to have severe stenosis obtuse marginal treated with a bare metal stent. Stress test August 2016 without ischemia. Echo August 2016 with normal LV systolic function.   She is here for follow up and is doing well. No chest pain or SOB. She has stopped smoking completely. No LE edema. Some fatigue.   Primary Care Physician: Shirline Frees, MD   Past Medical History:  Diagnosis Date  . Anginal pain ([place])   . Anxiety   . Bence Jones proteinuria 02/29/2012   Random urine "M spike" 7.1% 02/13/12  . CAD (coronary artery disease)    Remote PCI to RCA in 2001, stent to the RCA in 2002, with last cath in 2010 with placement of two overlapping drug eluting  stents to the RCA  . 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 Depression   . H/O: substance abuse   . Hyperlipidemia   . Myocardial infarction [facility])    "not sure; nobody ever tells me" (04/23/2012)  . Noncompliance   . PVD (peripheral vascular disease) ([place])    MILD    Past Surgical History:    Procedure Laterality Date  . ABDOMINAL HYSTERECTOMY  1999  . ANTERIOR CRUCIATE LIGAMENT REPAIR     "have had 12 surgeries on my right ACL"  . BREAST SURGERY     "removed knot from one side"  . CORONARY ANGIOPLASTY WITH STENT PLACEMENT  2002; 2010; 04/23/2012   "1 + 2 + 1; total of 4"  . EXCISIONAL HEMORRHOIDECTOMY    . HERNIA REPAIR     points to stomach  . LEFT HEART CATHETERIZATION WITH CORONARY ANGIOGRAM N/A 04/23/2012   Procedure: LEFT HEART CATHETERIZATION WITH CORONARY ANGIOGRAM;  Surgeon: Burnell Blanks, MD;  Location: [facility] CATH LAB;  Service: Cardiovascular;  Laterality: N/A;  . TONSILLECTOMY      Current Outpatient Prescriptions  Medication Sig Dispense Refill  . acetaminophen (TYLENOL) 500 MG tablet Take 1,000 mg by mouth every 6 (six) hours as needed. For pain    . aspirin 81 MG tablet Take 81 mg by mouth daily.      Marland Kitchen atorvastatin (LIPITOR) 40 MG tablet Take 1 tablet (40 mg total) by mouth daily. 90 tablet 3  . Cholecalciferol (VITAMIN D3) 10000 units TABS Take 1 tablet by mouth daily.    . ferrous sulfate 325 (65 FE) MG EC tablet Take 325 mg by mouth daily.    . hydroxychloroquine (PLAQUENIL) 200 MG tablet Take  400 mg by mouth daily.     . metoprolol succinate (TOPROL-XL) 25 MG 24 hr tablet Take 1 tablet (25 mg total) by mouth daily. 90 tablet 3  . nitroGLYCERIN (NITROSTAT) 0.4 MG SL tablet PLACE 1 TABLET (0.4 MG TOTAL) UNDER THE TONGUE EVERY 5 (FIVE) MINUTES AS NEEDED. FOR CHEST PAIN 75 tablet 3  . zolpidem (AMBIEN) 10 MG tablet Take 10 mg by mouth at bedtime as needed. For sleep     No current facility-administered medications for this visit.     No Known Allergies  Social History   Social History  . Marital status: Single    Spouse name: N/A  . Number of children: N/A  . Years of education: N/A   Occupational History  . Not on file.   Social History Main Topics  . Smoking status: Former Smoker    Packs/day: 1.50    Years: 38.00    Types:  Cigarettes    Quit date: 04/20/2012  . Smokeless tobacco: Never Used  . Alcohol use 8.4 oz/week    14 Cans of beer per week  . Drug use:     Types: Marijuana     Comment: 04/23/2012 "last marijuana last Friday; puff or 2"  . Sexual activity: No   Other Topics Concern  . Not on file   Social History Narrative  . No narrative on file    Family History  Problem Relation Age of Onset  . Hypertension Mother   . Lung cancer Mother   . Heart disease Father   . Hypertension Father   . Stroke Father   . Heart failure Father     Review of Systems:  As stated in the HPI and otherwise negative.   BP 112/82   Pulse (!) 55   Ht 4\' 11"  (1.499 m)   Wt 131 lb 6.4 oz (59.6 kg)   BMI 26.54 kg/m   Physical Examination: General: Well developed, well nourished, NAD  HEENT: OP clear, mucus membranes moist  SKIN: warm, dry. No rashes. Neuro: No focal deficits  Musculoskeletal: Muscle strength 5/5 all ext  Psychiatric: Mood and affect normal  Neck: No JVD, no carotid bruits, no thyromegaly, no lymphadenopathy.  Lungs:Clear bilaterally, no wheezes, rhonci, crackles Cardiovascular: Regular rate and rhythm. No murmurs, gallops or rubs. Abdomen:Soft. Bowel sounds present. Non-tender.  Extremities: No lower extremity edema. Pulses are 2 + in the bilateral DP/PT.  Echo August 2016: Left ventricle: The cavity size was normal. Systolic function was   normal. The estimated ejection fraction was in the range of 60%   to 65%. Wall motion was normal; there were no regional wall   motion abnormalities. Left ventricular diastolic function   parameters were normal.  Cardiac cath 04/23/12:  Left main: No obstructive disease.  Left Anterior Descending Artery: Large caliber vessel that courses to the apex. There is a 20% stenosis proximally. The diagonal branch is patent.  Intermediate branch: Moderate sized. No disease noted.  Circumflex Artery: Moderate caliber vessel with a long area of 50%  stenosis proximally followed by a 99% stenosis leading into the large OM branch. The continuation of the AV groove Circumflex is occluded beyond the marginal branch and fills from right to left collaterals, unchanged from last cath.  Right Coronary Artery: Large, dominant vessel with diffuse 30% stenosis in the mid vessel with patent overlapping stents in the distal vessel. There is minimal in-stent restenosis. The distal AV groove Circumflex is seen to fill from right to left  collaterals.  Left Ventricular Angiogram: LVEF=50%  Impression:  1. Double vessel CAD with patent stents RCA and severe stenosis in the mid Circumflex extending into the first OM branch.  2. Low normal LV systolic function  3. Unstable angina  4. Successful PTCA/bare metal stent x 1.  EKG:  EKG is ordered today. The ekg ordered today demonstrates sinus brady, rate 55 bpm. RV conduction delay.    Recent Labs: No results found for requested labs within last 8760 hours.   Lipid Panel    Component Value Date/Time   CHOL 165 03/01/2015 0758   TRIG 119.0 03/01/2015 0758   HDL 48.20 03/01/2015 0758   CHOLHDL 3 03/01/2015 0758   VLDL 23.8 03/01/2015 0758   LDLCALC 93 03/01/2015 0758     Wt Readings from Last 3 Encounters:  03/13/16 131 lb 6.4 oz (59.6 kg)  03/09/15 128 lb (58.1 kg)  03/01/15 128 lb 8 oz (58.3 kg)     Other studies Reviewed: Additional studies/ records that were reviewed today include: Review of the above records demonstrates:   Assessment and Plan:   1. CAD: no recent chest pain suggestive of angina. Stress test in August 2016 with no ischemia. Continue ASA 81 mg po Qdaily, beta blocker and statin.   2. Tobacco abuse, in remission: She has stopped smoking.   3. Hyperlipidemia: She is on Lipitor 40 mg daily. Will check lipids and LFTs today.     4. Fatigue: This does not seem to be cardiac related. She is known to have normal LV systolic function with no valve disease. CAD is stable. She  always has bradycardia and her HR today is only 55 bpm. She will follow up with primary care to rule out other causes of anemia.    Current medicines are reviewed at length with the patient today.  The patient does not have concerns regarding medicines.  The following changes have been made:  no change  Labs/ tests ordered today include:   Orders Placed This Encounter  Procedures  . Lipid Profile  . Hepatic function panel  . EKG 12-Lead    Disposition:   FU with me in 12 months  Signed, Lauree Chandler, MD 03/13/2016 12:18 PM    [facility] [address], [place], [place]  [address] Phone: [phone]; Fax: [phone]

## 2016-03-15 ENCOUNTER — Other Ambulatory Visit: Payer: Self-pay | Admitting: *Deleted

## 2016-03-15 DIAGNOSIS — E785 Hyperlipidemia, unspecified: Secondary | ICD-10-CM

## 2016-03-15 MED ORDER — ATORVASTATIN CALCIUM 80 MG PO TABS: 80.0000 mg | ORAL_TABLET | Freq: Every day | ORAL | 3 refills | Status: DC

## 2016-03-23 DIAGNOSIS — L930 Discoid lupus erythematosus: Secondary | ICD-10-CM | POA: Diagnosis not present

## 2016-03-27 DIAGNOSIS — Z01419 Encounter for gynecological examination (general) (routine) without abnormal findings: Secondary | ICD-10-CM | POA: Diagnosis not present

## 2016-03-27 DIAGNOSIS — Z6825 Body mass index (BMI) 25.0-25.9, adult: Secondary | ICD-10-CM | POA: Diagnosis not present

## 2016-04-02 DIAGNOSIS — Z1231 Encounter for screening mammogram for malignant neoplasm of breast: Secondary | ICD-10-CM | POA: Diagnosis not present

## 2016-05-10 DIAGNOSIS — G4700 Insomnia, unspecified: Secondary | ICD-10-CM | POA: Diagnosis not present

## 2016-05-10 DIAGNOSIS — E119 Type 2 diabetes mellitus without complications: Secondary | ICD-10-CM | POA: Diagnosis not present

## 2016-05-10 DIAGNOSIS — L930 Discoid lupus erythematosus: Secondary | ICD-10-CM | POA: Diagnosis not present

## 2016-05-10 DIAGNOSIS — E782 Mixed hyperlipidemia: Secondary | ICD-10-CM | POA: Diagnosis not present

## 2016-05-31 ENCOUNTER — Telehealth: Payer: Self-pay | Admitting: Cardiovascular Disease

## 2016-05-31 NOTE — Telephone Encounter (Signed)
Pt calling re lab appt on Monday 06-04-16, not sure if she should move it because she hasn't taken it as prescribed due  to bruising

## 2016-05-31 NOTE — Telephone Encounter (Signed)
I spoke with pt and told her atorvastatin should not cause bruising.  She only skipped one dose.  She will continue atorvastatin and come in for lab work as planned on 11/20. She reports legs, hands and knees have been aching for last week.   I told her to let us know if aches do not improve.

## 2016-06-04 ENCOUNTER — Other Ambulatory Visit: Payer: Federal, State, Local not specified - PPO | Admitting: *Deleted

## 2016-06-04 DIAGNOSIS — E785 Hyperlipidemia, unspecified: Secondary | ICD-10-CM

## 2016-06-04 LAB — LIPID PANEL
CHOLESTEROL: 145 mg/dL (ref <200)
HDL: 40 mg/dL — ABNORMAL LOW (ref >50)
LDL Cholesterol: 91 mg/dL (ref <100)
TRIGLYCERIDES: 69 mg/dL (ref <150)
Total CHOL/HDL Ratio: 3.6 Ratio (ref <5.0)
VLDL: 14 mg/dL (ref <30)

## 2016-06-04 LAB — HEPATIC FUNCTION PANEL
ALT: 21 U/L (ref 6–29)
AST: 18 U/L (ref 10–35)
Albumin: 4.1 g/dL (ref 3.6–5.1)
Alkaline Phosphatase: 69 U/L (ref 33–130)
BILIRUBIN DIRECT: 0.1 mg/dL (ref <=0.2)
BILIRUBIN INDIRECT: 0.2 mg/dL (ref 0.2–1.2)
TOTAL PROTEIN: 6.2 g/dL (ref 6.1–8.1)
Total Bilirubin: 0.3 mg/dL (ref 0.2–1.2)

## 2016-06-06 ENCOUNTER — Other Ambulatory Visit: Payer: Self-pay | Admitting: *Deleted

## 2016-06-06 DIAGNOSIS — K080 Exfoliation of teeth due to systemic causes: Secondary | ICD-10-CM | POA: Diagnosis not present

## 2016-06-06 MED ORDER — EZETIMIBE 10 MG PO TABS: 10.0000 mg | ORAL_TABLET | Freq: Every day | ORAL | 11 refills | Status: DC

## 2016-06-09 DIAGNOSIS — H6693 Otitis media, unspecified, bilateral: Secondary | ICD-10-CM | POA: Diagnosis not present

## 2016-06-20 ENCOUNTER — Telehealth: Payer: Self-pay | Admitting: Cardiovascular Disease

## 2016-06-20 DIAGNOSIS — H6593 Unspecified nonsuppurative otitis media, bilateral: Secondary | ICD-10-CM | POA: Diagnosis not present

## 2016-06-20 MED ORDER — EZETIMIBE 10 MG PO TABS: 10.0000 mg | ORAL_TABLET | Freq: Every day | ORAL | 3 refills | Status: DC

## 2016-06-20 MED ORDER — ATORVASTATIN CALCIUM 20 MG PO TABS: 20.0000 mg | ORAL_TABLET | Freq: Every day | ORAL | 3 refills | Status: DC

## 2016-06-20 NOTE — Telephone Encounter (Signed)
Pt was having aches and cholesterol med was changed from Lipitor to Zetia, aches stopped after 7 days, and is doing great now-if need to call [phone]

## 2016-06-20 NOTE — Telephone Encounter (Signed)
I spoke with pt and told her I would send Zetia prescription  to mail order.

## 2016-06-20 NOTE — Telephone Encounter (Signed)
Pt forgot to ask if [name] can do a mail in order rx of vetia

## 2016-06-20 NOTE — Telephone Encounter (Signed)
Can we see if she is willing to try Lipitor 20 mg daily? cdm

## 2016-06-20 NOTE — Telephone Encounter (Signed)
I spoke with pt and she is willing to try Lipitor 20 mg daily. She will let us know if muscle aches return. [facility]

## 2016-06-20 NOTE — Telephone Encounter (Signed)
See lab results dated 06/04/16. Pt stopped atorvastatin due to muscle aches.  Will forward to Dr. Angelena Form for review /recommendations.

## 2016-07-20 DIAGNOSIS — Z09 Encounter for follow-up examination after completed treatment for conditions other than malignant neoplasm: Secondary | ICD-10-CM | POA: Diagnosis not present

## 2016-07-20 DIAGNOSIS — M329 Systemic lupus erythematosus, unspecified: Secondary | ICD-10-CM | POA: Diagnosis not present

## 2016-07-20 DIAGNOSIS — Z79899 Other long term (current) drug therapy: Secondary | ICD-10-CM | POA: Diagnosis not present

## 2016-07-20 DIAGNOSIS — H40013 Open angle with borderline findings, low risk, bilateral: Secondary | ICD-10-CM | POA: Diagnosis not present

## 2016-07-24 ENCOUNTER — Other Ambulatory Visit: Payer: Self-pay | Admitting: Family Medicine

## 2016-07-24 ENCOUNTER — Ambulatory Visit
Admission: RE | Admit: 2016-07-24 | Discharge: 2016-07-24 | Disposition: A | Discharge status: Home / Self Care | Payer: Federal, State, Local not specified - PPO | Source: Ambulatory Visit | Attending: Family Medicine | Admitting: Family Medicine

## 2016-07-24 DIAGNOSIS — M79671 Pain in right foot: Secondary | ICD-10-CM | POA: Diagnosis not present

## 2016-07-24 DIAGNOSIS — M79642 Pain in left hand: Secondary | ICD-10-CM | POA: Diagnosis not present

## 2016-10-03 ENCOUNTER — Encounter (HOSPITAL_COMMUNITY): Payer: Self-pay | Admitting: Emergency Medicine

## 2016-10-03 ENCOUNTER — Emergency Department (HOSPITAL_COMMUNITY): Payer: Federal, State, Local not specified - PPO

## 2016-10-03 ENCOUNTER — Emergency Department (HOSPITAL_COMMUNITY)
Admission: EM | Admit: 2016-10-03 | Discharge: 2016-10-03 | Disposition: A | Discharge status: Home / Self Care | Payer: Federal, State, Local not specified - PPO | Attending: Emergency Medicine | Admitting: Emergency Medicine

## 2016-10-03 DIAGNOSIS — R079 Chest pain, unspecified: Secondary | ICD-10-CM | POA: Diagnosis not present

## 2016-10-03 DIAGNOSIS — R51 Headache: Secondary | ICD-10-CM | POA: Diagnosis not present

## 2016-10-03 DIAGNOSIS — Z955 Presence of coronary angioplasty implant and graft: Secondary | ICD-10-CM | POA: Diagnosis not present

## 2016-10-03 DIAGNOSIS — I252 Old myocardial infarction: Secondary | ICD-10-CM | POA: Insufficient documentation

## 2016-10-03 DIAGNOSIS — R0789 Other chest pain: Secondary | ICD-10-CM | POA: Insufficient documentation

## 2016-10-03 DIAGNOSIS — Z87891 Personal history of nicotine dependence: Secondary | ICD-10-CM | POA: Insufficient documentation

## 2016-10-03 DIAGNOSIS — I2510 Atherosclerotic heart disease of native coronary artery without angina pectoris: Secondary | ICD-10-CM | POA: Diagnosis not present

## 2016-10-03 DIAGNOSIS — Z7982 Long term (current) use of aspirin: Secondary | ICD-10-CM | POA: Diagnosis not present

## 2016-10-03 DIAGNOSIS — R519 Headache, unspecified: Secondary | ICD-10-CM

## 2016-10-03 DIAGNOSIS — I10 Essential (primary) hypertension: Secondary | ICD-10-CM | POA: Diagnosis not present

## 2016-10-03 LAB — CBC
HCT: 36.8 % (ref 36.0–46.0)
Hemoglobin: 11.9 g/dL — ABNORMAL LOW (ref 12.0–15.0)
MCH: 28.7 pg (ref 26.0–34.0)
MCHC: 32.3 g/dL (ref 30.0–36.0)
MCV: 88.7 fL (ref 78.0–100.0)
PLATELETS: 303 10*3/uL (ref 150–400)
RBC: 4.15 MIL/uL (ref 3.87–5.11)
RDW: 14.4 % (ref 11.5–15.5)
WBC: 6.6 10*3/uL (ref 4.0–10.5)

## 2016-10-03 LAB — BASIC METABOLIC PANEL
Anion gap: 9 (ref 5–15)
BUN: 13 mg/dL (ref 6–20)
CALCIUM: 9.6 mg/dL (ref 8.9–10.3)
CHLORIDE: 107 mmol/L (ref 101–111)
CO2: 26 mmol/L (ref 22–32)
Creatinine, Ser: 0.67 mg/dL (ref 0.44–1.00)
GFR calc non Af Amer: >60 mL/min (ref >60)
Glucose, Bld: 114 mg/dL — ABNORMAL HIGH (ref 65–99)
Potassium: 3.5 mmol/L (ref 3.5–5.1)
Sodium: 142 mmol/L (ref 135–145)

## 2016-10-03 LAB — I-STAT TROPONIN, ED
TROPONIN I, POC: 0 ng/mL (ref 0.00–0.08)
TROPONIN I, POC: 0 ng/mL (ref 0.00–0.08)

## 2016-10-03 LAB — CARBOXYHEMOGLOBIN - COOX: Carboxyhemoglobin: 0.8 % (ref 0.5–1.5)

## 2016-10-03 MED ORDER — HYDROCODONE-ACETAMINOPHEN 5-325 MG PO TABS
1.0000 | ORAL_TABLET | Freq: Once | ORAL | Status: AC
  Administered 2016-10-03: 1 via ORAL
  Filled 2016-10-03: qty 1
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
## 2016-10-03 NOTE — ED Notes (Signed)
Pt up to nurse first inquiring how much longer before she is seen, pt advised of high census and updated on wait time. Pt encouraged to stay until she can be seen by a doctor, pt stated she would wait a little longer. Pt ambulatory in waiting room, resp e/u, speaking in full sentences.

## 2016-10-03 NOTE — Discharge Instructions (Signed)
Your lab work and chest x-ray today were normal. Make sure to follow-up with your doctor. Can take over the counter cough medication as needed.  Tylenol or motrin for headache. Return here for any new/worsening symptoms.

## 2016-11-08 DIAGNOSIS — G4700 Insomnia, unspecified: Secondary | ICD-10-CM | POA: Diagnosis not present

## 2016-11-08 DIAGNOSIS — E559 Vitamin D deficiency, unspecified: Secondary | ICD-10-CM | POA: Diagnosis not present

## 2016-11-08 DIAGNOSIS — E119 Type 2 diabetes mellitus without complications: Secondary | ICD-10-CM | POA: Diagnosis not present

## 2016-11-08 DIAGNOSIS — I10 Essential (primary) hypertension: Secondary | ICD-10-CM | POA: Diagnosis not present

## 2016-11-08 DIAGNOSIS — E782 Mixed hyperlipidemia: Secondary | ICD-10-CM | POA: Diagnosis not present

## 2016-11-15 DIAGNOSIS — L930 Discoid lupus erythematosus: Secondary | ICD-10-CM | POA: Diagnosis not present

## 2016-12-17 DIAGNOSIS — L930 Discoid lupus erythematosus: Secondary | ICD-10-CM | POA: Diagnosis not present

## 2017-01-17 DIAGNOSIS — L738 Other specified follicular disorders: Secondary | ICD-10-CM | POA: Diagnosis not present

## 2017-01-17 DIAGNOSIS — L930 Discoid lupus erythematosus: Secondary | ICD-10-CM | POA: Diagnosis not present

## 2017-02-08 DIAGNOSIS — E119 Type 2 diabetes mellitus without complications: Secondary | ICD-10-CM | POA: Diagnosis not present

## 2017-02-26 NOTE — Progress Notes (Signed)
Chief Complaint  Patient presents with  . Follow-up    CAD    History of Present Illness: 61 yo female with history of CAD with prior PCI of the RCA with stenting of the RCA in 2002 and 2010 and stenting of the Circumflex in 2013, former tobacco abuse, cutaneous lupus, anxiety, depression, HLD, PAD and DM here today for cardiac follow up. She has known CAD with prior PCI of the RCA followed by stenting of the RCA in 2002. Cath in 2010 showed that her RCA was subtotally occluded distally. She then had two overlapping drug eluting stents placed in the distal RCA. She has a total of 3 stents in the RCA. Stress test in 2013 in the setting of chest pain showed possible lateral wall ischemia. She was brought in for a cath on 04/23/12 and was found to have severe stenosis in the obtuse marginal treated with a bare metal stent. Stress test August 2016 without ischemia. Echo August 2016 with normal LV systolic function.   He is here today for follow up. The patient denies any chest pain, dyspnea, palpitations, lower extremity edema, orthopnea, PND, dizziness, near syncope or syncope.    Primary Care Physician: Shirline Frees, MD   Past Medical History:  Diagnosis Date  . Anginal pain ([place])   . Anxiety   . Bence Jones proteinuria 02/29/2012   Random urine "M spike" 7.1% 02/13/12  . CAD (coronary artery disease)    Remote PCI to RCA in 2001, stent to the RCA in 2002, with last cath in 2010 with placement of two overlapping drug eluting  stents to the RCA  . 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 Depression   . H/O: substance abuse   . Hyperlipidemia   . Myocardial infarction [facility])    "not sure; nobody ever tells me" (04/23/2012)  . Noncompliance   . PVD (peripheral vascular disease) ([place])    MILD    Past Surgical History:  Procedure Laterality Date  . ABDOMINAL HYSTERECTOMY  1999  . ANTERIOR CRUCIATE LIGAMENT REPAIR     "have had 12 surgeries on my right ACL"  . BREAST  SURGERY     "removed knot from one side"  . CORONARY ANGIOPLASTY WITH STENT PLACEMENT  2002; 2010; 04/23/2012   "1 + 2 + 1; total of 4"  . EXCISIONAL HEMORRHOIDECTOMY    . HERNIA REPAIR     points to stomach  . LEFT HEART CATHETERIZATION WITH CORONARY ANGIOGRAM N/A 04/23/2012   Procedure: LEFT HEART CATHETERIZATION WITH CORONARY ANGIOGRAM;  Surgeon: Burnell Blanks, MD;  Location: [facility] CATH LAB;  Service: Cardiovascular;  Laterality: N/A;  . TONSILLECTOMY      Current Outpatient Prescriptions  Medication Sig Dispense Refill  . acetaminophen (TYLENOL) 500 MG tablet Take 1,000 mg by mouth every 6 (six) hours as needed for mild pain. For pain     . aspirin 81 MG tablet Take 81 mg by mouth daily.      Marland Kitchen atorvastatin (LIPITOR) 20 MG tablet Take 1 tablet (20 mg total) by mouth daily. 90 tablet 3  . ezetimibe (ZETIA) 10 MG tablet Take 1 tablet (10 mg total) by mouth daily. 90 tablet 3  . hydroxychloroquine (PLAQUENIL) 200 MG tablet Take 200 mg by mouth daily as needed (lupus).     . metFORMIN (GLUCOPHAGE-XR) 500 MG 24 hr tablet Take 500 mg by mouth daily.  6  . metoprolol succinate (TOPROL-XL) 25 MG 24 hr tablet Take 1  tablet (25 mg total) by mouth daily. 90 tablet 3  . nitroGLYCERIN (NITROSTAT) 0.4 MG SL tablet PLACE 1 TABLET (0.4 MG TOTAL) UNDER THE TONGUE EVERY 5 (FIVE) MINUTES AS NEEDED. FOR CHEST PAIN 75 tablet 3  . zolpidem (AMBIEN) 10 MG tablet Take 10 mg by mouth at bedtime as needed for sleep.      No current facility-administered medications for this visit.     No Known Allergies  Social History   Social History  . Marital status: Single    Spouse name: N/A  . Number of children: N/A  . Years of education: N/A   Occupational History  . Not on file.   Social History Main Topics  . Smoking status: Former Smoker    Packs/day: 1.50    Years: 38.00    Types: Cigarettes    Quit date: 04/20/2012  . Smokeless tobacco: Never Used  . Alcohol use 8.4 oz/week    14 Cans of  beer per week  . Drug use: Yes    Types: Marijuana     Comment: 04/23/2012 "last marijuana last Friday; puff or 2"  . Sexual activity: No   Other Topics Concern  . Not on file   Social History Narrative  . No narrative on file    Family History  Problem Relation Age of Onset  . Hypertension Mother   . Lung cancer Mother   . Heart disease Father   . Hypertension Father   . Stroke Father   . Heart failure Father     Review of Systems:  As stated in the HPI and otherwise negative.   BP 114/80   Pulse (!) 55   Ht 4\' 11"  (1.499 m)   Wt 130 lb 12.8 oz (59.3 kg)   SpO2 99%   BMI 26.42 kg/m   Physical Examination:  General: Well developed, well nourished, NAD  HEENT: OP clear, mucus membranes moist  SKIN: warm, dry. No rashes. Neuro: No focal deficits  Musculoskeletal: Muscle strength 5/5 all ext  Psychiatric: Mood and affect normal  Neck: No JVD, no carotid bruits, no thyromegaly, no lymphadenopathy.  Lungs:Clear bilaterally, no wheezes, rhonci, crackles Cardiovascular: Regular rate and rhythm. No murmurs, gallops or rubs. Abdomen:Soft. Bowel sounds present. Non-tender.  Extremities: No lower extremity edema. Pulses are 2 + in the bilateral DP/PT.  Echo August 2016: Left ventricle: The cavity size was normal. Systolic function was   normal. The estimated ejection fraction was in the range of 60%   to 65%. Wall motion was normal; there were no regional wall   motion abnormalities. Left ventricular diastolic function   parameters were normal.  Cardiac cath 04/23/12:  Left main: No obstructive disease.  Left Anterior Descending Artery: Large caliber vessel that courses to the apex. There is a 20% stenosis proximally. The diagonal branch is patent.  Intermediate branch: Moderate sized. No disease noted.  Circumflex Artery: Moderate caliber vessel with a long area of 50% stenosis proximally followed by a 99% stenosis leading into the large OM branch. The continuation of  the AV groove Circumflex is occluded beyond the marginal branch and fills from right to left collaterals, unchanged from last cath.  Right Coronary Artery: Large, dominant vessel with diffuse 30% stenosis in the mid vessel with patent overlapping stents in the distal vessel. There is minimal in-stent restenosis. The distal AV groove Circumflex is seen to fill from right to left collaterals.  Left Ventricular Angiogram: LVEF=50%  Impression:  1. Double vessel CAD with  patent stents RCA and severe stenosis in the mid Circumflex extending into the first OM branch.  2. Low normal LV systolic function  3. Unstable angina  4. Successful PTCA/bare metal stent x 1.  EKG:  EKG is ordered today. The ekg ordered today demonstrates     Recent Labs: 06/04/2016: ALT 21 10/03/2016: BUN 13; Creatinine, Ser 0.67; Hemoglobin 11.9; Platelets 303; Potassium 3.5; Sodium 142   Lipid Panel    Component Value Date/Time   CHOL 145 06/04/2016 0811   TRIG 69 06/04/2016 0811   HDL 40 (L) 06/04/2016 0811   CHOLHDL 3.6 06/04/2016 0811   VLDL 14 06/04/2016 0811   LDLCALC 91 06/04/2016 0811     Wt Readings from Last 3 Encounters:  02/27/17 130 lb 12.8 oz (59.3 kg)  10/03/16 132 lb (59.9 kg)  03/13/16 131 lb 6.4 oz (59.6 kg)     Other studies Reviewed: Additional studies/ records that were reviewed today include: Review of the above records demonstrates:   Assessment and Plan:   1. CAD without angina: No chest pain suggestive of angina. Stress test in August 2016 with no ischemia. Will continue ASA, beta blocker and statin.    2. Tobacco abuse, in remission: She stopped smoking  3. Hyperlipidemia: LDL near goal. Continue statin. I will repeat lipids and LFTs this am.   Current medicines are reviewed at length with the patient today.  The patient does not have concerns regarding medicines.  The following changes have been made:  no change  Labs/ tests ordered today include:   Orders Placed This  Encounter  Procedures  . Lipid Profile  . Hepatic function panel    Disposition:   FU with me in 12 months  Signed, Lauree Chandler, MD 02/27/2017 9:01 AM    [facility] [address], [place], [place]  [address] Phone: [phone]; Fax: [phone]

## 2017-02-27 ENCOUNTER — Encounter: Payer: Self-pay | Admitting: Cardiovascular Disease

## 2017-02-27 ENCOUNTER — Ambulatory Visit (INDEPENDENT_AMBULATORY_CARE_PROVIDER_SITE_OTHER): Payer: Federal, State, Local not specified - PPO | Admitting: Cardiovascular Disease

## 2017-02-27 VITALS — BP 114/80 | HR 55 | Ht 59.0 in | Wt 130.8 lb

## 2017-02-27 DIAGNOSIS — I2510 Atherosclerotic heart disease of native coronary artery without angina pectoris: Secondary | ICD-10-CM | POA: Diagnosis not present

## 2017-02-27 DIAGNOSIS — F17201 Nicotine dependence, unspecified, in remission: Secondary | ICD-10-CM | POA: Diagnosis not present

## 2017-02-27 DIAGNOSIS — E7800 Pure hypercholesterolemia, unspecified: Secondary | ICD-10-CM | POA: Diagnosis not present

## 2017-02-27 LAB — HEPATIC FUNCTION PANEL
ALT: 19 IU/L (ref 0–32)
AST: 21 IU/L (ref 0–40)
Albumin: 4.4 g/dL (ref 3.5–5.5)
Alkaline Phosphatase: 71 IU/L (ref 39–117)
Bilirubin, Direct: 0.07 mg/dL (ref 0.00–0.40)
TOTAL PROTEIN: 6.4 g/dL (ref 6.0–8.5)

## 2017-02-27 LAB — LIPID PANEL
CHOLESTEROL TOTAL: 115 mg/dL (ref 100–199)
Chol/HDL Ratio: 2.6 ratio (ref 0.0–4.4)
HDL: 45 mg/dL (ref >39)
LDL Calculated: 56 mg/dL (ref 0–99)
Triglycerides: 68 mg/dL (ref 0–149)
VLDL CHOLESTEROL CAL: 14 mg/dL (ref 5–40)

## 2017-02-27 NOTE — Patient Instructions (Signed)
Medication Instructions:  Your physician recommends that you continue on your current medications as directed. Please refer to the Current Medication list given to you today.   Labwork: Lab work to be done today--Lipid and Liver profiles  Testing/Procedures: none  Follow-Up: Your physician recommends that you schedule a follow-up appointment in: 12 months. Please call our office in about 9 months to schedule this appointment.     Any Other Special Instructions Will Be Listed Below (If Applicable).     If you need a refill on your cardiac medications before your next appointment, please call your pharmacy.

## 2017-03-11 ENCOUNTER — Telehealth: Payer: Self-pay | Admitting: Cardiovascular Disease

## 2017-03-11 NOTE — Telephone Encounter (Signed)
New Message  Pt call requesting to speak with RN about getting her calcium level number. Please call back to discuss

## 2017-03-11 NOTE — Telephone Encounter (Signed)
Pt asking for cholesterol numbers from lipid profile done 02/27/17, discussed results with patient.

## 2017-04-12 ENCOUNTER — Ambulatory Visit: Payer: Self-pay | Admitting: Cardiovascular Disease

## 2017-05-01 DIAGNOSIS — Z6825 Body mass index (BMI) 25.0-25.9, adult: Secondary | ICD-10-CM | POA: Diagnosis not present

## 2017-05-01 DIAGNOSIS — Z01419 Encounter for gynecological examination (general) (routine) without abnormal findings: Secondary | ICD-10-CM | POA: Diagnosis not present

## 2017-05-01 DIAGNOSIS — Z1231 Encounter for screening mammogram for malignant neoplasm of breast: Secondary | ICD-10-CM | POA: Diagnosis not present

## 2017-05-01 DIAGNOSIS — Z13820 Encounter for screening for osteoporosis: Secondary | ICD-10-CM | POA: Diagnosis not present

## 2017-05-06 ENCOUNTER — Other Ambulatory Visit: Payer: Self-pay

## 2017-05-06 MED ORDER — METOPROLOL SUCCINATE ER 25 MG PO TB24: 25.0000 mg | ORAL_TABLET | Freq: Every day | ORAL | 2 refills | Status: DC

## 2017-05-24 DIAGNOSIS — M8580 Other specified disorders of bone density and structure, unspecified site: Secondary | ICD-10-CM | POA: Diagnosis not present

## 2017-06-05 DIAGNOSIS — I10 Essential (primary) hypertension: Secondary | ICD-10-CM | POA: Diagnosis not present

## 2017-06-05 DIAGNOSIS — E782 Mixed hyperlipidemia: Secondary | ICD-10-CM | POA: Diagnosis not present

## 2017-06-05 DIAGNOSIS — E119 Type 2 diabetes mellitus without complications: Secondary | ICD-10-CM | POA: Diagnosis not present

## 2017-06-05 DIAGNOSIS — M799 Soft tissue disorder, unspecified: Secondary | ICD-10-CM | POA: Diagnosis not present

## 2017-07-03 ENCOUNTER — Other Ambulatory Visit: Payer: Self-pay | Admitting: General Surgery

## 2017-07-03 DIAGNOSIS — M799 Soft tissue disorder, unspecified: Secondary | ICD-10-CM | POA: Diagnosis not present

## 2017-07-03 DIAGNOSIS — M7989 Other specified soft tissue disorders: Secondary | ICD-10-CM

## 2017-07-03 DIAGNOSIS — K080 Exfoliation of teeth due to systemic causes: Secondary | ICD-10-CM | POA: Diagnosis not present

## 2017-07-10 ENCOUNTER — Ambulatory Visit
Admission: RE | Admit: 2017-07-10 | Discharge: 2017-07-10 | Disposition: A | Discharge status: Home / Self Care | Payer: Federal, State, Local not specified - PPO | Source: Ambulatory Visit | Attending: General Surgery | Admitting: General Surgery

## 2017-07-10 DIAGNOSIS — M7989 Other specified soft tissue disorders: Secondary | ICD-10-CM

## 2017-07-10 DIAGNOSIS — M799 Soft tissue disorder, unspecified: Secondary | ICD-10-CM | POA: Diagnosis not present

## 2017-07-10 MED ORDER — GADOBENATE DIMEGLUMINE 529 MG/ML IV SOLN
13.0000 mL | Freq: Once | INTRAVENOUS | Status: AC | PRN
  Administered 2017-07-10: 13 mL via INTRAVENOUS

## 2017-07-19 ENCOUNTER — Telehealth: Payer: Self-pay | Admitting: Cardiovascular Disease

## 2017-07-19 ENCOUNTER — Other Ambulatory Visit: Payer: Self-pay | Admitting: Cardiovascular Disease

## 2017-07-19 NOTE — Telephone Encounter (Signed)
Request for surgical clearance:  1. What type of surgery is being performed?  Excision of soft tissue mass on back   2. When is this surgery scheduled? Pending clearance   3. Are there any medications that need to be held prior to surgery and how long? ASA if needed   4. Name of physician performing surgery?  Dr. Greer Pickerel at [facility]  5. What is your office phone and fax number? Phone- [phone]  Fax- [phone] ATTN: Jan Ranson

## 2017-07-19 NOTE — Telephone Encounter (Signed)
   Primary Cardiologist: No primary care provider on file.  Chart reviewed as part of pre-operative protocol coverage. Patient was contacted 07/19/2017 in reference to pre-operative risk assessment for pending surgery as outlined below.  [name] was last seen on 02/27/17 by Dr. Angelena Form.  Since that day, [name] has done well. DSAI is 6.7 Mets.   Therefore, based on ACC/AHA guidelines, the patient would be at acceptable risk for the planned procedure without further cardiovascular testing.   I will route conversation to Dr. Angelena Form to review Aspirin.   [place], [place] 07/19/2017, 3:15 PM

## 2017-07-19 NOTE — Telephone Encounter (Signed)
Ok to hold ASA. [name]  

## 2017-07-19 NOTE — Telephone Encounter (Signed)
Ok to hold ASA for 5-7 days prior to surgery.

## 2017-07-22 NOTE — Telephone Encounter (Signed)
   Chart reviewed as part of pre-operative protocol coverage. Pre-op clearance already addressed by colleagues below. Not clear to me if this was routed to requesting party. To summarize recommendations, Vin Bhagat PA-C feels the patient is at acceptable risk for the planned procedure without further cardiovascular testing, and both he and Dr. Angelena Form have recommended OK to hold ASA 5-7 days prior to surgery.   Will route this bundled recommendation to requesting provider via Epic fax function. Please call with questions.  Charlie Pitter, PA-C 07/22/2017, 7:45 AM

## 2017-08-01 DIAGNOSIS — R079 Chest pain, unspecified: Secondary | ICD-10-CM | POA: Diagnosis not present

## 2017-08-01 DIAGNOSIS — R1013 Epigastric pain: Secondary | ICD-10-CM | POA: Diagnosis not present

## 2017-08-01 DIAGNOSIS — R109 Unspecified abdominal pain: Secondary | ICD-10-CM | POA: Diagnosis not present

## 2017-08-02 ENCOUNTER — Encounter: Payer: Self-pay | Admitting: Cardiology

## 2017-08-02 ENCOUNTER — Ambulatory Visit: Payer: Federal, State, Local not specified - PPO | Admitting: Cardiology

## 2017-08-02 VITALS — BP 132/80 | HR 66 | Ht 59.0 in | Wt 130.8 lb

## 2017-08-02 DIAGNOSIS — I2510 Atherosclerotic heart disease of native coronary artery without angina pectoris: Secondary | ICD-10-CM | POA: Diagnosis not present

## 2017-08-02 NOTE — Progress Notes (Signed)
08/02/2017 [name]   04-07-1956  [id number]  Primary Physician Shirline Frees, MD Primary Cardiologist: Dr. Angelena Form   Reason for Visit/CC:  Chest Pain   HPI:  [name] is a 62 y.o. female, followed by Dr. Angelena Form, who presents to clinic today for evaluation for chest pain. She has known CAD  with prior PCI of the RCA with stenting of the RCA in 2002 and 2010 and stenting of the Circumflex in 2013. Stress test in 2016 negative for ischemia. Echo normal. Last seen by Dr. Angelena Form 02/27/17 and doing well w/o issues.   Pt was contacted 07/19/17 for preop evaluation with phone call, for excision of soft tissue mass on back. She denied any cardiac symptoms and was felt to be low risk. She was cleared over the phone for surgery. Dr. Angelena Form noted ok to hold ASA.   Yesterday, the patient developed left sided rib cage pain and wanted to be evaluated. Pain is c/w musculoskeletal etiology. Point tenderness, reproducible with palpitation and exacerbated by certain movements. Improved with ibuprofen. Pt thinks she pulled a muscle when trying to help lift up her 32 y/o mother in the chair the other day. She denies ischemic type pain. No exertional CP or dyspnea. EKG show NSR. BP well controlled.   Current Meds  Medication Sig  . acetaminophen (TYLENOL) 500 MG tablet Take 1,000 mg by mouth every 6 (six) hours as needed for mild pain. For pain   . aspirin 81 MG tablet Take 81 mg by mouth daily.    Marland Kitchen atorvastatin (LIPITOR) 20 MG tablet TAKE 1 TABLET DAILY (DOSE  DECREASE)  . ezetimibe (ZETIA) 10 MG tablet TAKE 1 TABLET DAILY  . hydroxychloroquine (PLAQUENIL) 200 MG tablet Take 200 mg by mouth daily as needed (lupus).   . metFORMIN (GLUCOPHAGE-XR) 500 MG 24 hr tablet Take 500 mg by mouth daily.  . metoprolol succinate (TOPROL-XL) 25 MG 24 hr tablet Take 1 tablet (25 mg total) by mouth daily.  . nitroGLYCERIN (NITROSTAT) 0.4 MG SL tablet PLACE 1 TABLET (0.4 MG TOTAL) UNDER THE TONGUE EVERY  5 (FIVE) MINUTES AS NEEDED. FOR CHEST PAIN  . zolpidem (AMBIEN) 10 MG tablet Take 10 mg by mouth at bedtime as needed for sleep.    No Known Allergies Past Medical History:  Diagnosis Date  . Anginal pain ([place])   . Anxiety   . Bence Jones proteinuria 02/29/2012   Random urine "M spike" 7.1% 02/13/12  . CAD (coronary artery disease)    Remote PCI to RCA in 2001, stent to the RCA in 2002, with last cath in 2010 with placement of two overlapping drug eluting  stents to the RCA  . 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 Depression   . H/O: substance abuse   . Hyperlipidemia   . Myocardial infarction [facility])    "not sure; nobody ever tells me" (04/23/2012)  . Noncompliance   . PVD (peripheral vascular disease) ([place])    MILD   Family History  Problem Relation Age of Onset  . Hypertension Mother   . Lung cancer Mother   . Heart disease Father   . Hypertension Father   . Stroke Father   . Heart failure Father    Past Surgical History:  Procedure Laterality Date  . ABDOMINAL HYSTERECTOMY  1999  . ANTERIOR CRUCIATE LIGAMENT REPAIR     "have had 12 surgeries on my right ACL"  . BREAST SURGERY     "removed knot from  one side"  . CORONARY ANGIOPLASTY WITH STENT PLACEMENT  2002; 2010; 04/23/2012   "1 + 2 + 1; total of 4"  . EXCISIONAL HEMORRHOIDECTOMY    . HERNIA REPAIR     points to stomach  . LEFT HEART CATHETERIZATION WITH CORONARY ANGIOGRAM N/A 04/23/2012   Procedure: LEFT HEART CATHETERIZATION WITH CORONARY ANGIOGRAM;  Surgeon: Burnell Blanks, MD;  Location: [facility] CATH LAB;  Service: Cardiovascular;  Laterality: N/A;  . TONSILLECTOMY     Social History   Socioeconomic History  . Marital status: Single    Spouse name: Not on file  . Number of children: Not on file  . Years of education: Not on file  . Highest education level: Not on file  Social Needs  . Financial resource strain: Not on file  . Food insecurity - worry: Not on file  . Food insecurity -  inability: Not on file  . Transportation needs - medical: Not on file  . Transportation needs - non-medical: Not on file  Occupational History  . Not on file  Tobacco Use  . Smoking status: Former Smoker    Packs/day: 1.50    Years: 38.00    Pack years: 57.00    Types: Cigarettes    Last attempt to quit: 04/20/2012    Years since quitting: 5.2  . Smokeless tobacco: Never Used  Substance and Sexual Activity  . Alcohol use: Yes    Alcohol/week: 8.4 oz    Types: 14 Cans of beer per week  . Drug use: Yes    Types: Marijuana    Comment: 04/23/2012 "last marijuana last Friday; puff or 2"  . Sexual activity: No  Other Topics Concern  . Not on file  Social History Narrative  . Not on file     Review of Systems: General: negative for chills, fever, night sweats or weight changes.  Cardiovascular: negative for chest pain, dyspnea on exertion, edema, orthopnea, palpitations, paroxysmal nocturnal dyspnea or shortness of breath Dermatological: negative for rash Respiratory: negative for cough or wheezing Urologic: negative for hematuria Abdominal: negative for nausea, vomiting, diarrhea, bright red blood per rectum, melena, or hematemesis Neurologic: negative for visual changes, syncope, or dizziness All other systems reviewed and are otherwise negative except as noted above.   Physical Exam:  Blood pressure 132/80, pulse 66, height 4\' 11"  (1.499 m), weight 130 lb 12.8 oz (59.3 kg), SpO2 99 %.  General appearance: alert, cooperative and no distress Neck: no carotid bruit and no JVD Lungs: clear to auscultation bilaterally Heart: regular rate and rhythm, S1, S2 normal, no murmur, click, rub or gallop Extremities: extremities normal, atraumatic, no cyanosis or edema Pulses: 2+ and symmetric Skin: Skin color, texture, turgor normal. No rashes or lesions Neurologic: Grossly normal  EKG NSR, 56  -- personally reviewed   ASSESSMENT AND PLAN:   1. Chest Pain: Noncardiac. C/w  musculoskeletal pain as outlined above in HPI. Pt reassured. Continue PRN Tylenol.  2. CAD: prior PCI of the RCA with stenting of the RCA in 2002 and 2010 and stenting of the Circumflex in 2013. Stress test in 2016 negative for ischemia. No anginal symptoms. Continue medical therapy.   3. HTN: controlled on current regimen.   4. DM: on Metformin. managed by PCP.    Follow-Up: keep yearly f/u with Dr. Angelena Form 02/2018  Lyda Jester PA-C, [facility] 08/02/2017 11:53 AM

## 2017-08-02 NOTE — Patient Instructions (Signed)
Medication Instructions:  1. Your physician recommends that you continue on your current medications as directed. Please refer to the Current Medication list given to you today.   Labwork: NONE ORDERED TODAY  Testing/Procedures: NONE ORDERED TODAY  Follow-Up: WE WILL SEND OUT A LETTER SOMETIME ABOUT June OR July TO CALL AND MAKE AN APPT WITH DR. Angelena Form IN 02/2018  Any Other Special Instructions Will Be Listed Below (If Applicable).     If you need a refill on your cardiac medications before your next appointment, please call your pharmacy.

## 2017-08-06 NOTE — Progress Notes (Signed)
Please place orders in Epic as patient has a pre-op appointment on 08/26/2017! Thank you!

## 2017-08-20 ENCOUNTER — Ambulatory Visit: Payer: Self-pay | Admitting: General Surgery

## 2017-08-23 ENCOUNTER — Other Ambulatory Visit (HOSPITAL_COMMUNITY): Payer: Self-pay | Admitting: *Deleted

## 2017-08-23 NOTE — Progress Notes (Signed)
[place] 08-02-17 Epic EKG 08-01-17 Epic CHEST XRAY 10-03-17 EPIC

## 2017-08-23 NOTE — Patient Instructions (Addendum)
RAJAH LAMBA  08/23/2017   Your procedure is scheduled on: 08-28-17  Report to [facility] Main  Entrance  Report to admitting at 1030 AM  Call this number if you have problems the morning of surgery [phone]   Remember: Do not eat food after midnight  :            NO SOLID FOOD AFTER MIDNIGHT THE NIGHT PRIOR TO [place]. NOTHING BY MOUTH EXCEPT CLEAR LIQUIDS UNTIL 3 HOURS PRIOR TO [place] SURGERY. PLEASE FINISH ENSURE DRINK PER SURGEON ORDER 3 HOURS PRIOR TO SCHEDULED SURGERY TIME WHICH NEEDS TO BE COMPLETED AT 930 am CLEAR LIQUID DIET   Foods Allowed                                                                     Foods Excluded  Coffee and tea, regular and decaf                             liquids that you cannot  Plain Jell-O in any flavor                                             see through such as: Fruit ices (not with fruit pulp)                                     milk, soups, orange juice  Iced Popsicles                                    All solid food Carbonated beverages, regular and diet                                    Cranberry, grape and apple juices Sports drinks like Gatorade Lightly seasoned clear broth or consume(fat free) Sugar, honey syrup  Sample Menu Breakfast                                Lunch                                     Supper Cranberry juice                    Beef broth                            Chicken broth Jell-O  Grape juice                           Apple juice Coffee or tea                        Jell-O                                      Popsicle                                                Coffee or tea                        Coffee or tea  _____________________________________________________________________   Call dr Redmond Pulling and ask about stopping  Plaquenil. Phone [phone]     Take these medicines the morning of surgery with A SIP OF WATER: none DO  NOT TAKE ANY DIABETIC MEDICATIONS DAY OF YOUR SURGERY                               You may not have any metal on your body including hair pins and              piercings  Do not wear jewelry, make-up, lotions, powders or perfumes, deodorant             Do not wear nail polish.  Do not shave  48 hours prior to surgery.             Do not bring valuables to the hospital. [place].  Contacts, dentures or bridgework may not be worn into surgery.  Leave suitcase in the car. After surgery it may be brought to your room.     Patients discharged the day of surgery will not be allowed to drive home.  Name and phone number of your driver:DAUGHTER [place] [phone]  Special Instructions: N/A              Please read over the following fact sheets you were given: _____________________________________________________________________                _____________________________________________________________________  How to Manage Your Diabetes Before and After Surgery  Why is it important to control my blood sugar before and after surgery? . Improving blood sugar levels before and after surgery helps healing and can limit problems. . A way of improving blood sugar control is eating a healthy diet by: o  Eating less sugar and carbohydrates o  Increasing activity/exercise o  Talking with your doctor about reaching your blood sugar goals . High blood sugars (greater than 180 mg/dL) can raise your risk of infections and slow your recovery, so you will need to focus on controlling your diabetes during the weeks before surgery. . Make sure that the doctor who takes care of your diabetes knows about your planned surgery including the date and location.  How do I manage my blood sugar before surgery? . Check your blood sugar at least 4 times a day,  starting 2 days before surgery, to make sure that the level is not too high or  low. o Check your blood sugar the morning of your surgery when you wake up and every 2 hours until you get to the Short Stay unit. . If your blood sugar is less than 70 mg/dL, you will need to treat for low blood sugar: o Do not take insulin. o Treat a low blood sugar (less than 70 mg/dL) with  cup of clear juice (cranberry or apple), 4 glucose tablets, OR glucose gel. o Recheck blood sugar in 15 minutes after treatment (to make sure it is greater than 70 mg/dL). If your blood sugar is not greater than 70 mg/dL on recheck, call [phone] for further instructions. . Report your blood sugar to the short stay nurse when you get to Short Stay.  . If you are admitted to the hospital after surgery: o Your blood sugar will be checked by the staff and you will probably be given insulin after surgery (instead of oral diabetes medicines) to make sure you have good blood sugar levels. o The goal for blood sugar control after surgery is 80-180 mg/dL.   WHAT DO I DO ABOUT MY DIABETES MEDICATION?  Marland Kitchen Do not take oral diabetes medicines (pills) the morning of surgery.  . THE DAY BEFORE SURGERY  2-13-19TAKE YOUR METFORMIN AS USUAL  .  THE DAY OF SURGERY 08-29-17 , DO NOT [place] - Preparing for Surgery Before surgery, you can play an important role.  Because skin is not sterile, your skin needs to be as free of germs as possible.  You can reduce the number of germs on your skin by washing with CHG (chlorahexidine gluconate) soap before surgery.  CHG is an antiseptic cleaner which kills germs and bonds with the skin to continue killing germs even after washing. Please DO NOT use if you have an allergy to CHG or antibacterial soaps.  If your skin becomes reddened/irritated stop using the CHG and inform your nurse when you arrive at Short Stay. Do not shave (including legs and underarms) for at least 48 hours prior to the first CHG shower.  You may shave your face/neck. Please  follow these instructions carefully:  1.  Shower with CHG Soap the night before surgery and the  morning of Surgery.  2.  If you choose to wash your hair, wash your hair first as usual with your  normal  shampoo.  3.  After you shampoo, rinse your hair and body thoroughly to remove the  shampoo.                           4.  Use CHG as you would any other liquid soap.  You can apply chg directly  to the skin and wash                       Gently with a scrungie or clean washcloth.  5.  Apply the CHG Soap to your body ONLY FROM THE NECK DOWN.   Do not use on face/ open                           Wound or open sores. Avoid contact with eyes, ears mouth and genitals (private parts).  Wash face,  Genitals (private parts) with your normal soap.             6.  Wash thoroughly, paying special attention to the area where your surgery  will be performed.  7.  Thoroughly rinse your body with warm water from the neck down.  8.  DO NOT shower/wash with your normal soap after using and rinsing off  the CHG Soap.                9.  Pat yourself dry with a clean towel.            10.  Wear clean pajamas.            11.  Place clean sheets on your bed the night of your first shower and do not  sleep with pets. Day of Surgery : Do not apply any lotions/deodorants the morning of surgery.  Please wear clean clothes to the hospital/surgery center.  FAILURE TO FOLLOW THESE INSTRUCTIONS MAY RESULT IN THE CANCELLATION OF YOUR SURGERY PATIENT SIGNATURE_________________________________  NURSE SIGNATURE__________________________________  ________________________________________________________________________

## 2017-08-26 ENCOUNTER — Encounter (HOSPITAL_COMMUNITY): Payer: Self-pay

## 2017-08-26 ENCOUNTER — Other Ambulatory Visit: Payer: Self-pay

## 2017-08-26 ENCOUNTER — Encounter (HOSPITAL_COMMUNITY)
Admission: RE | Admit: 2017-08-26 | Discharge: 2017-08-26 | Disposition: A | Discharge status: Home / Self Care | Payer: Federal, State, Local not specified - PPO | Source: Ambulatory Visit | Attending: General Surgery | Admitting: General Surgery

## 2017-08-26 DIAGNOSIS — Z79899 Other long term (current) drug therapy: Secondary | ICD-10-CM | POA: Diagnosis not present

## 2017-08-26 DIAGNOSIS — L930 Discoid lupus erythematosus: Secondary | ICD-10-CM | POA: Diagnosis not present

## 2017-08-26 DIAGNOSIS — Z7982 Long term (current) use of aspirin: Secondary | ICD-10-CM | POA: Diagnosis not present

## 2017-08-26 DIAGNOSIS — Z823 Family history of stroke: Secondary | ICD-10-CM | POA: Diagnosis not present

## 2017-08-26 DIAGNOSIS — Z801 Family history of malignant neoplasm of trachea, bronchus and lung: Secondary | ICD-10-CM | POA: Diagnosis not present

## 2017-08-26 DIAGNOSIS — D213 Benign neoplasm of connective and other soft tissue of thorax: Secondary | ICD-10-CM | POA: Diagnosis not present

## 2017-08-26 DIAGNOSIS — Z90710 Acquired absence of both cervix and uterus: Secondary | ICD-10-CM | POA: Diagnosis not present

## 2017-08-26 DIAGNOSIS — Z87891 Personal history of nicotine dependence: Secondary | ICD-10-CM | POA: Diagnosis not present

## 2017-08-26 DIAGNOSIS — F419 Anxiety disorder, unspecified: Secondary | ICD-10-CM | POA: Diagnosis not present

## 2017-08-26 DIAGNOSIS — I252 Old myocardial infarction: Secondary | ICD-10-CM | POA: Diagnosis not present

## 2017-08-26 DIAGNOSIS — Z7984 Long term (current) use of oral hypoglycemic drugs: Secondary | ICD-10-CM | POA: Diagnosis not present

## 2017-08-26 DIAGNOSIS — F329 Major depressive disorder, single episode, unspecified: Secondary | ICD-10-CM | POA: Diagnosis not present

## 2017-08-26 DIAGNOSIS — Z955 Presence of coronary angioplasty implant and graft: Secondary | ICD-10-CM | POA: Diagnosis not present

## 2017-08-26 DIAGNOSIS — Z8249 Family history of ischemic heart disease and other diseases of the circulatory system: Secondary | ICD-10-CM | POA: Diagnosis not present

## 2017-08-26 DIAGNOSIS — Z9119 Patient's noncompliance with other medical treatment and regimen: Secondary | ICD-10-CM | POA: Diagnosis not present

## 2017-08-26 DIAGNOSIS — E1151 Type 2 diabetes mellitus with diabetic peripheral angiopathy without gangrene: Secondary | ICD-10-CM | POA: Diagnosis not present

## 2017-08-26 DIAGNOSIS — R222 Localized swelling, mass and lump, trunk: Secondary | ICD-10-CM | POA: Diagnosis present

## 2017-08-26 DIAGNOSIS — E785 Hyperlipidemia, unspecified: Secondary | ICD-10-CM | POA: Diagnosis not present

## 2017-08-26 DIAGNOSIS — I2510 Atherosclerotic heart disease of native coronary artery without angina pectoris: Secondary | ICD-10-CM | POA: Diagnosis not present

## 2017-08-26 HISTORY — DX: Type 2 diabetes mellitus without complications: E11.9

## 2017-08-26 HISTORY — DX: Localized swelling, mass and lump, trunk: R22.2

## 2017-08-26 LAB — CBC WITH DIFFERENTIAL/PLATELET
BASOS ABS: 0 10*3/uL (ref 0.0–0.1)
BASOS PCT: 0 %
Eosinophils Absolute: 0 10*3/uL (ref 0.0–0.7)
Eosinophils Relative: 1 %
HEMATOCRIT: 35.9 % — AB (ref 36.0–46.0)
HEMOGLOBIN: 11.6 g/dL — AB (ref 12.0–15.0)
LYMPHS PCT: 56 %
Lymphs Abs: 2.5 10*3/uL (ref 0.7–4.0)
MCH: 29 pg (ref 26.0–34.0)
MCHC: 32.3 g/dL (ref 30.0–36.0)
MCV: 89.8 fL (ref 78.0–100.0)
Monocytes Absolute: 0.2 10*3/uL (ref 0.1–1.0)
Monocytes Relative: 4 %
NEUTROS ABS: 1.8 10*3/uL (ref 1.7–7.7)
NEUTROS PCT: 39 %
Platelets: 311 10*3/uL (ref 150–400)
RBC: 4 MIL/uL (ref 3.87–5.11)
RDW: 15.2 % (ref 11.5–15.5)
WBC: 4.5 10*3/uL (ref 4.0–10.5)

## 2017-08-26 LAB — BASIC METABOLIC PANEL
ANION GAP: 8 (ref 5–15)
BUN: 17 mg/dL (ref 6–20)
CALCIUM: 9.3 mg/dL (ref 8.9–10.3)
CO2: 26 mmol/L (ref 22–32)
Chloride: 108 mmol/L (ref 101–111)
Creatinine, Ser: 0.61 mg/dL (ref 0.44–1.00)
Glucose, Bld: 131 mg/dL — ABNORMAL HIGH (ref 65–99)
POTASSIUM: 4.3 mmol/L (ref 3.5–5.1)
Sodium: 142 mmol/L (ref 135–145)

## 2017-08-26 LAB — HEMOGLOBIN A1C
Hgb A1c MFr Bld: 7.5 % — ABNORMAL HIGH (ref 4.8–5.6)
MEAN PLASMA GLUCOSE: 168.55 mg/dL

## 2017-08-26 LAB — GLUCOSE, CAPILLARY: Glucose-Capillary: 140 mg/dL — ABNORMAL HIGH (ref 65–99)

## 2017-08-29 ENCOUNTER — Ambulatory Visit (HOSPITAL_COMMUNITY): Payer: Federal, State, Local not specified - PPO | Admitting: Anesthesiology

## 2017-08-29 ENCOUNTER — Encounter (HOSPITAL_COMMUNITY): Payer: Self-pay

## 2017-08-29 ENCOUNTER — Encounter (HOSPITAL_COMMUNITY)
Admission: RE | Disposition: A | Discharge status: Home / Self Care | Payer: Self-pay | Source: Ambulatory Visit | Attending: General Surgery

## 2017-08-29 ENCOUNTER — Ambulatory Visit (HOSPITAL_COMMUNITY)
Admission: RE | Admit: 2017-08-29 | Discharge: 2017-08-29 | Disposition: A | Discharge status: Home / Self Care | Payer: Federal, State, Local not specified - PPO | Source: Ambulatory Visit | Attending: General Surgery | Admitting: General Surgery

## 2017-08-29 DIAGNOSIS — Z7982 Long term (current) use of aspirin: Secondary | ICD-10-CM | POA: Diagnosis not present

## 2017-08-29 DIAGNOSIS — E1151 Type 2 diabetes mellitus with diabetic peripheral angiopathy without gangrene: Secondary | ICD-10-CM | POA: Insufficient documentation

## 2017-08-29 DIAGNOSIS — R222 Localized swelling, mass and lump, trunk: Secondary | ICD-10-CM | POA: Diagnosis not present

## 2017-08-29 DIAGNOSIS — Z90710 Acquired absence of both cervix and uterus: Secondary | ICD-10-CM | POA: Insufficient documentation

## 2017-08-29 DIAGNOSIS — E785 Hyperlipidemia, unspecified: Secondary | ICD-10-CM | POA: Insufficient documentation

## 2017-08-29 DIAGNOSIS — Z955 Presence of coronary angioplasty implant and graft: Secondary | ICD-10-CM | POA: Diagnosis not present

## 2017-08-29 DIAGNOSIS — L930 Discoid lupus erythematosus: Secondary | ICD-10-CM | POA: Insufficient documentation

## 2017-08-29 DIAGNOSIS — F329 Major depressive disorder, single episode, unspecified: Secondary | ICD-10-CM | POA: Diagnosis not present

## 2017-08-29 DIAGNOSIS — F419 Anxiety disorder, unspecified: Secondary | ICD-10-CM | POA: Insufficient documentation

## 2017-08-29 DIAGNOSIS — Z87891 Personal history of nicotine dependence: Secondary | ICD-10-CM | POA: Diagnosis not present

## 2017-08-29 DIAGNOSIS — Z801 Family history of malignant neoplasm of trachea, bronchus and lung: Secondary | ICD-10-CM | POA: Insufficient documentation

## 2017-08-29 DIAGNOSIS — I2510 Atherosclerotic heart disease of native coronary artery without angina pectoris: Secondary | ICD-10-CM | POA: Insufficient documentation

## 2017-08-29 DIAGNOSIS — Z7984 Long term (current) use of oral hypoglycemic drugs: Secondary | ICD-10-CM | POA: Insufficient documentation

## 2017-08-29 DIAGNOSIS — I252 Old myocardial infarction: Secondary | ICD-10-CM | POA: Insufficient documentation

## 2017-08-29 DIAGNOSIS — Z9119 Patient's noncompliance with other medical treatment and regimen: Secondary | ICD-10-CM | POA: Insufficient documentation

## 2017-08-29 DIAGNOSIS — D213 Benign neoplasm of connective and other soft tissue of thorax: Secondary | ICD-10-CM | POA: Diagnosis not present

## 2017-08-29 DIAGNOSIS — I10 Essential (primary) hypertension: Secondary | ICD-10-CM | POA: Diagnosis not present

## 2017-08-29 DIAGNOSIS — Z823 Family history of stroke: Secondary | ICD-10-CM | POA: Diagnosis not present

## 2017-08-29 DIAGNOSIS — Z8249 Family history of ischemic heart disease and other diseases of the circulatory system: Secondary | ICD-10-CM | POA: Insufficient documentation

## 2017-08-29 DIAGNOSIS — Z79899 Other long term (current) drug therapy: Secondary | ICD-10-CM | POA: Insufficient documentation

## 2017-08-29 DIAGNOSIS — I25110 Atherosclerotic heart disease of native coronary artery with unstable angina pectoris: Secondary | ICD-10-CM | POA: Diagnosis not present

## 2017-08-29 HISTORY — PX: MASS EXCISION: SHX2000

## 2017-08-29 LAB — GLUCOSE, CAPILLARY
GLUCOSE-CAPILLARY: 72 mg/dL (ref 65–99)
Glucose-Capillary: 213 mg/dL — ABNORMAL HIGH (ref 65–99)
Glucose-Capillary: 94 mg/dL (ref 65–99)

## 2017-08-29 SURGERY — EXCISION MASS
Anesthesia: General

## 2017-08-29 MED ORDER — ACETAMINOPHEN 500 MG PO TABS
1000.0000 mg | ORAL_TABLET | ORAL | Status: AC
  Administered 2017-08-29: 1000 mg via ORAL

## 2017-08-29 MED ORDER — PROPOFOL 10 MG/ML IV BOLUS
INTRAVENOUS | Status: DC | PRN
  Administered 2017-08-29: 150 mg via INTRAVENOUS

## 2017-08-29 MED ORDER — LIDOCAINE 2% (20 MG/ML) 5 ML SYRINGE
INTRAMUSCULAR | Status: AC
  Filled 2017-08-29: qty 5

## 2017-08-29 MED ORDER — SUCCINYLCHOLINE CHLORIDE 200 MG/10ML IV SOSY
PREFILLED_SYRINGE | INTRAVENOUS | Status: DC | PRN
  Administered 2017-08-29: 100 mg via INTRAVENOUS

## 2017-08-29 MED ORDER — BUPIVACAINE-EPINEPHRINE 0.25% -1:200000 IJ SOLN
INTRAMUSCULAR | Status: DC | PRN
  Administered 2017-08-29: 30 mL

## 2017-08-29 MED ORDER — CEFAZOLIN SODIUM-DEXTROSE 2-4 GM/100ML-% IV SOLN
2.0000 g | INTRAVENOUS | Status: AC
Start: 2017-08-29 — End: 2017-08-29
  Administered 2017-08-29: 2 g via INTRAVENOUS

## 2017-08-29 MED ORDER — BUPIVACAINE-EPINEPHRINE 0.25% -1:200000 IJ SOLN
INTRAMUSCULAR | Status: AC
  Filled 2017-08-29: qty 1

## 2017-08-29 MED ORDER — ONDANSETRON HCL 4 MG/2ML IJ SOLN
INTRAMUSCULAR | Status: AC
  Filled 2017-08-29: qty 2

## 2017-08-29 MED ORDER — PROPOFOL 10 MG/ML IV BOLUS
INTRAVENOUS | Status: AC
  Filled 2017-08-29: qty 20

## 2017-08-29 MED ORDER — LACTATED RINGERS IV SOLN
INTRAVENOUS | Status: DC
  Administered 2017-08-29: 15:00:00 via INTRAVENOUS
  Administered 2017-08-29: 1000 mL via INTRAVENOUS

## 2017-08-29 MED ORDER — EPHEDRINE SULFATE-NACL 50-0.9 MG/10ML-% IV SOSY
PREFILLED_SYRINGE | INTRAVENOUS | Status: DC | PRN
  Administered 2017-08-29: 10 mg via INTRAVENOUS

## 2017-08-29 MED ORDER — ACETAMINOPHEN 500 MG PO TABS
ORAL_TABLET | ORAL | Status: AC
  Filled 2017-08-29: qty 1

## 2017-08-29 MED ORDER — INSULIN ASPART 100 UNIT/ML ~~LOC~~ SOLN
5.0000 [IU] | Freq: Once | SUBCUTANEOUS | Status: AC
  Administered 2017-08-29: 5 [IU] via SUBCUTANEOUS

## 2017-08-29 MED ORDER — PROMETHAZINE HCL 25 MG/ML IJ SOLN: 6.2500 mg | INTRAMUSCULAR | Status: DC | PRN

## 2017-08-29 MED ORDER — ROCURONIUM BROMIDE 10 MG/ML (PF) SYRINGE
PREFILLED_SYRINGE | INTRAVENOUS | Status: AC
  Filled 2017-08-29: qty 5

## 2017-08-29 MED ORDER — CHLORHEXIDINE GLUCONATE 4 % EX LIQD: 60.0000 mL | Freq: Once | CUTANEOUS | Status: DC

## 2017-08-29 MED ORDER — ONDANSETRON HCL 4 MG/2ML IJ SOLN
INTRAMUSCULAR | Status: DC | PRN
  Administered 2017-08-29: 4 mg via INTRAVENOUS

## 2017-08-29 MED ORDER — MIDAZOLAM HCL 2 MG/2ML IJ SOLN
INTRAMUSCULAR | Status: AC
  Filled 2017-08-29: qty 2

## 2017-08-29 MED ORDER — MIDAZOLAM HCL 5 MG/5ML IJ SOLN
INTRAMUSCULAR | Status: DC | PRN
  Administered 2017-08-29: 2 mg via INTRAVENOUS

## 2017-08-29 MED ORDER — FENTANYL CITRATE (PF) 100 MCG/2ML IJ SOLN
INTRAMUSCULAR | Status: DC | PRN
  Administered 2017-08-29: 100 ug via INTRAVENOUS

## 2017-08-29 MED ORDER — FENTANYL CITRATE (PF) 100 MCG/2ML IJ SOLN
INTRAMUSCULAR | Status: AC
  Filled 2017-08-29: qty 2

## 2017-08-29 MED ORDER — BACITRACIN-NEOMYCIN-POLYMYXIN 400-5-5000 EX OINT
TOPICAL_OINTMENT | CUTANEOUS | Status: AC
  Filled 2017-08-29: qty 1

## 2017-08-29 MED ORDER — OXYCODONE HCL 5 MG PO TABS: 5.0000 mg | ORAL_TABLET | Freq: Four times a day (QID) | ORAL | 0 refills | Status: DC | PRN

## 2017-08-29 MED ORDER — INSULIN ASPART 100 UNIT/ML ~~LOC~~ SOLN
SUBCUTANEOUS | Status: AC
  Filled 2017-08-29: qty 1

## 2017-08-29 MED ORDER — GABAPENTIN 300 MG PO CAPS
300.0000 mg | ORAL_CAPSULE | ORAL | Status: AC
  Administered 2017-08-29: 300 mg via ORAL

## 2017-08-29 MED ORDER — CEFAZOLIN SODIUM-DEXTROSE 2-4 GM/100ML-% IV SOLN
INTRAVENOUS | Status: AC
  Filled 2017-08-29: qty 100

## 2017-08-29 MED ORDER — FENTANYL CITRATE (PF) 100 MCG/2ML IJ SOLN: 25.0000 ug | INTRAMUSCULAR | Status: DC | PRN

## 2017-08-29 MED ORDER — LIDOCAINE 2% (20 MG/ML) 5 ML SYRINGE
INTRAMUSCULAR | Status: DC | PRN
  Administered 2017-08-29: 60 mg via INTRAVENOUS

## 2017-08-29 MED ORDER — GABAPENTIN 300 MG PO CAPS
ORAL_CAPSULE | ORAL | Status: AC
  Filled 2017-08-29: qty 1

## 2017-08-29 SURGICAL SUPPLY — 39 items
ADH SKN CLS APL DERMABOND .7 (GAUZE/BANDAGES/DRESSINGS)
APL SKNCLS STERI-STRIP NONHPOA (GAUZE/BANDAGES/DRESSINGS)
APPLIER CLIP 9.375 MED OPEN (MISCELLANEOUS) ×2
APR CLP MED 9.3 20 MLT OPN (MISCELLANEOUS) ×1
BENZOIN TINCTURE PRP APPL 2/3 (GAUZE/BANDAGES/DRESSINGS) IMPLANT
CLIP APPLIE 9.375 MED OPEN (MISCELLANEOUS) IMPLANT
COVER SURGICAL LIGHT HANDLE (MISCELLANEOUS) ×2 IMPLANT
DECANTER SPIKE VIAL GLASS SM (MISCELLANEOUS) ×1 IMPLANT
DERMABOND ADVANCED (GAUZE/BANDAGES/DRESSINGS)
DERMABOND ADVANCED .7 DNX12 (GAUZE/BANDAGES/DRESSINGS) IMPLANT
DRAPE LAPAROSCOPIC ABDOMINAL (DRAPES) IMPLANT
DRAPE LAPAROTOMY T 102X78X121 (DRAPES) IMPLANT
DRAPE LAPAROTOMY T 98X78 PEDS (DRAPES) IMPLANT
DRAPE LAPAROTOMY TRNSV 102X78 (DRAPE) ×1 IMPLANT
DRAPE SHEET LG 3/4 BI-LAMINATE (DRAPES) IMPLANT
DRAPE UTILITY XL STRL (DRAPES) ×2 IMPLANT
ELECT REM PT RETURN 15FT ADLT (MISCELLANEOUS) ×2 IMPLANT
GAUZE PACKING IODOFORM 1/4X15 (GAUZE/BANDAGES/DRESSINGS) IMPLANT
GAUZE SPONGE 4X4 12PLY STRL (GAUZE/BANDAGES/DRESSINGS) ×1 IMPLANT
GLOVE BIO SURGEON STRL SZ7.5 (GLOVE) ×5 IMPLANT
GLOVE INDICATOR 8.0 STRL GRN (GLOVE) ×2 IMPLANT
GOWN STRL REUS W/TWL XL LVL3 (GOWN DISPOSABLE) ×4 IMPLANT
KIT BASIN OR (CUSTOM PROCEDURE TRAY) ×2 IMPLANT
MARKER SKIN DUAL TIP RULER LAB (MISCELLANEOUS) IMPLANT
NDL HYPO 25X1 1.5 SAFETY (NEEDLE) ×1 IMPLANT
NEEDLE HYPO 25X1 1.5 SAFETY (NEEDLE) ×2 IMPLANT
PACK GENERAL/GYN (CUSTOM PROCEDURE TRAY) ×2 IMPLANT
SPONGE LAP 18X18 X RAY DECT (DISPOSABLE) IMPLANT
SPONGE LAP 4X18 X RAY DECT (DISPOSABLE) IMPLANT
STAPLER VISISTAT 35W (STAPLE) IMPLANT
SUT ETHILON 3 0 PS 1 (SUTURE) ×1 IMPLANT
SUT MNCRL AB 4-0 PS2 18 (SUTURE) IMPLANT
SUT SILK 2 0 SH (SUTURE) ×1 IMPLANT
SUT VIC AB 3-0 SH 18 (SUTURE) ×1 IMPLANT
SWAB COLLECTION DEVICE MRSA (MISCELLANEOUS) IMPLANT
SWAB CULTURE ESWAB REG 1ML (MISCELLANEOUS) IMPLANT
SYR CONTROL 10ML LL (SYRINGE) ×2 IMPLANT
TOWEL OR 17X26 10 PK STRL BLUE (TOWEL DISPOSABLE) ×2 IMPLANT
TOWEL OR NON WOVEN STRL DISP B (DISPOSABLE) ×2 IMPLANT

## 2017-08-29 NOTE — Transfer of Care (Signed)
Immediate Anesthesia Transfer of Care Note  Patient: [name]  Procedure(s) Performed: EXCISIONAL BIOPSY OF BACK SOFT TISSUE MASS (N/A )  Patient Location: PACU  Anesthesia Type:General  Level of Consciousness: sedated, patient cooperative and responds to stimulation  Airway & Oxygen Therapy: Patient Spontanous Breathing and Patient connected to face mask oxygen  Post-op Assessment: Report given to RN and Post -op Vital signs reviewed and stable  Post vital signs: Reviewed and stable  Last Vitals: There were no vitals filed for this visit.  Last Pain: There were no vitals filed for this visit.    Patients Stated Pain Goal: 4 (09/81/19 1478)  Complications: No apparent anesthesia complications

## 2017-08-29 NOTE — Op Note (Signed)
08/29/2017  3:15 PM  PATIENT:  [name]  62 y.o. female  PRE-OPERATIVE DIAGNOSIS:  soft tissue mass over left inferior scapula    POST-OPERATIVE DIAGNOSIS: same  PROCEDURE:  Procedure(s): EXCISIONAL BIOPSY OF SOFT TISSUE MASS over left inferior scapula (3.5 x 3.x5 cm)  SURGEON:  Surgeon(s): Greer Pickerel, MD  ASSISTANTS: none   ANESTHESIA:   general  DRAINS: none   LOCAL MEDICATIONS USED:  MARCAINE     SPECIMEN:  Source of Specimen:  soft tissue mass, short superior, long lateral, double deep  DISPOSITION OF SPECIMEN:  PATHOLOGY  COUNTS:  YES  INDICATION FOR PROCEDURE: 62 year old female presented with an enlarging soft tissue mass over her left scapula.  She had been seen several years prior but lost to follow-up.  Repeat MRI showed an enlarged soft tissue mass over the left inferior scapular area.  It was not consistent with a lipoma or epidermoid inclusion cyst therefore I recommended excisional biopsy.  Please see chart discussing risk and benefits  PROCEDURE: After obtaining informed consent and marking the area with the patient confirming the area she was taken to operating room 1.  General endotracheal anesthesia was established.  She was then placed in prone position with the appropriate padding.  Sequential compression devices were placed.  She received IV Ancef prior to skin incision.  A surgical timeout was performed.  Her left upper back was prepped and draped in the usual standard surgical fashion with ChloraPrep.  A horizontal 4 cm incision was made directly over the mass after infiltrating local.  Subcutaneous fat was divided with electrocautery.  Using electrocautery I tunneled underneath the subcutaneous tissue cranially and I was able to get underneath the mass and start mobilizing it with electrocautery.  This was done circumferentially.  The  mass was very firm and solid.  It was attached to underlying muscle fascia.  Some of the fascia was taken in order to  get the mass off of it.  The mass was excised in its entirety.  It was marked as described above with suture.  The cavity was irrigated and hemostasis is achieved.  Local was infiltrated.  I placed clips at the superior, inferior medial and lateral borders of the cavity.  Some of the deep subcutaneous tissue was reapproximated with inverted interrupted 3-0 Vicryl sutures.  Skin was reapproximated with interrupted 3-0 nylon sutures.  Triple antibiotic ointment was then placed followed by gauze and tape.  The patient was placed supine extubated and taken to the recovery room in stable condition after being extubated.  All needle, instrument sponge counts were correct x2.  No immediate complications  PLAN OF CARE: Discharge to home after PACU  PATIENT DISPOSITION:  PACU - hemodynamically stable.   Delay start of Pharmacological VTE agent (>24hrs) due to surgical blood loss or risk of bleeding:  not applicable  Leighton Ruff. Redmond Pulling, MD, FACS General, Bariatric, & Minimally Invasive Surgery [facility], [place]

## 2017-08-29 NOTE — Anesthesia Preprocedure Evaluation (Signed)
Anesthesia Evaluation  Patient identified by MRN, date of birth, ID band Patient awake    Reviewed: Allergy & Precautions, NPO status , Patient's Chart, lab work & pertinent test results  Airway Mallampati: II  TM Distance: >3 FB Neck ROM: Full    Dental no notable dental hx.    Pulmonary neg pulmonary ROS, former smoker,    Pulmonary exam normal breath sounds clear to auscultation       Cardiovascular hypertension, + CAD, + Past MI, + Cardiac Stents and + Peripheral Vascular Disease  Normal cardiovascular exam Rhythm:Regular Rate:Normal     Neuro/Psych negative neurological ROS  negative psych ROS   GI/Hepatic negative GI ROS, Neg liver ROS,   Endo/Other  diabetes  Renal/GU negative Renal ROS  negative genitourinary   Musculoskeletal negative musculoskeletal ROS (+)   Abdominal   Peds negative pediatric ROS (+)  Hematology negative hematology ROS (+)   Anesthesia Other Findings   Reproductive/Obstetrics negative OB ROS                             Anesthesia Physical Anesthesia Plan  ASA: III  Anesthesia Plan: General   Post-op Pain Management:    Induction: Intravenous  PONV Risk Score and Plan: 3 and Ondansetron, Treatment may vary due to age or medical condition and Midazolam  Airway Management Planned: Oral ETT  Additional Equipment:   Intra-op Plan:   Post-operative Plan: Extubation in OR  Informed Consent: I have reviewed the patients History and Physical, chart, labs and discussed the procedure including the risks, benefits and alternatives for the proposed anesthesia with the patient or authorized representative who has indicated his/her understanding and acceptance.   Dental advisory given  Plan Discussed with: CRNA and Surgeon  Anesthesia Plan Comments:         Anesthesia Quick Evaluation

## 2017-08-29 NOTE — Anesthesia Procedure Notes (Signed)
Procedure Name: Intubation °Performed by: Tavonna Worthington J, CRNA °Pre-anesthesia Checklist: Patient identified, Emergency Drugs available, Suction available, Patient being monitored and Timeout performed °Patient Re-evaluated:Patient Re-evaluated prior to induction °Oxygen Delivery Method: Circle system utilized °Preoxygenation: Pre-oxygenation with 100% oxygen °Induction Type: IV induction °Ventilation: Mask ventilation without difficulty °Laryngoscope Size: Mac and 3 °Grade View: Grade I °Tube type: Oral °Tube size: 7.0 mm °Number of attempts: 1 °Airway Equipment and Method: Stylet °Placement Confirmation: ETT inserted through vocal cords under direct vision,  positive ETCO2,  CO2 detector and breath sounds checked- equal and bilateral °Secured at: 21 cm °Tube secured with: Tape °Dental Injury: Teeth and Oropharynx as per pre-operative assessment  ° ° ° ° ° ° °

## 2017-08-29 NOTE — Anesthesia Postprocedure Evaluation (Signed)
Anesthesia Post Note  Patient: [name]  Procedure(s) Performed: EXCISIONAL BIOPSY OF BACK SOFT TISSUE MASS (N/A )     Patient location during evaluation: PACU Anesthesia Type: General Level of consciousness: awake and alert Pain management: pain level controlled Vital Signs Assessment: post-procedure vital signs reviewed and stable Respiratory status: spontaneous breathing, nonlabored ventilation, respiratory function stable and patient connected to nasal cannula oxygen Cardiovascular status: blood pressure returned to baseline and stable Postop Assessment: no apparent nausea or vomiting Anesthetic complications: no    Last Vitals:  Vitals:   08/29/17 1600 08/29/17 1615  BP: 121/69 132/76  Pulse: 63 (!) 56  Resp: 14 13  Temp: (!) 36.3 C 36.7 C  SpO2: 97% 96%    Last Pain:  Vitals:   08/29/17 1615  PainSc: Asleep                 Somya Jauregui S

## 2017-08-29 NOTE — Discharge Instructions (Signed)
[place] Office Phone Number [phone]   POST OP INSTRUCTIONS  Always review your discharge instruction sheet given to you by the facility where your surgery was performed.  IF YOU HAVE DISABILITY OR FAMILY LEAVE FORMS, YOU MUST BRING THEM TO THE OFFICE FOR PROCESSING.  DO NOT GIVE THEM TO YOUR DOCTOR.  1. A prescription for pain medication may be given to you upon discharge.  take acetaminophen (Tylenol) and/or ibuprofen (Advil) as needed for pain. If still having pain, then you may take prescription pain medication - oxycodone. 2. If you need a refill on your pain medication, please contact your pharmacy.  They will contact our office to request authorization.  Prescriptions will not be filled after 5pm or on week-ends. 3. You should eat very light the first 24 hours after surgery, such as soup, crackers, pudding, etc.  Resume your normal diet the day after surgery. 4. Most patients will experience some swelling and bruising in the breast.  Ice packs and a good support bra will help.  Swelling and bruising can take several days to resolve.  5. It is common to experience some constipation if taking pain medication after surgery.  Increasing fluid intake and taking a stool softener will usually help or prevent this problem from occurring.  A mild laxative (Milk of Magnesia or Miralax) should be taken according to package directions if there are no bowel movements after 48 hours. 6. Unless discharge instructions indicate otherwise, you may remove your bandages 48 hours after surgery, and you may shower at that time.  You have sutures that will be removed at your follow up visit. Cover incision with gauze and change daily.  7. ACTIVITIES:  You may resume regular daily activities (gradually increasing) beginning the next day.    You may have sexual intercourse when it is comfortable. a. You may drive when you no longer are taking prescription pain medication, you can comfortably  wear a seatbelt, and you can safely maneuver your car and apply brakes. b. RETURN TO WORK:   8. You should see your doctor in the office for a follow-up appointment approximately two weeks after your surgery.  Your doctors nurse will typically make your follow-up appointment when she calls you with your pathology report.  Expect your pathology report 2-3 business days after your surgery.  You may call to check if you do not hear from [name] after three days. OTHER INSTRUCTIONS: NO HEAVY LIFTING/PUSHING/PULLING WITH LEFT ARM WHEN TO CALL YOUR DOCTOR: 1. Fever over 101.0 2. Nausea and/or vomiting. 3. Extreme swelling or bruising. 4. Continued bleeding from incision. 5. Increased pain, redness, or drainage from the incision.  The clinic staff is available to answer your questions during regular business hours.  Please dont hesitate to call and ask to speak to one of the nurses for clinical concerns.  If you have a medical emergency, go to the nearest emergency room or call 911.  A surgeon from [facility] is always on call at the hospital.  For further questions, please visit [email or web address]

## 2017-08-29 NOTE — H&P (Signed)
[name] is an 62 y.o. female.   Chief Complaint: Here for surgery HPI: 62 year old female comes in for excisional biopsy of a left soft tissue mass overlying the medial aspect of her left scapula.  It has been present since at least 2013.  She believes it has slowly increased in size.  It does cause some local discomfort.  She had an MRI back in 2013 which showed a 9 x 19 x 15 mm lesion associated with in the fascial layer between the subcutaneous fat and deep fat of the chest wall in the scapular region on the left without deeper muscular invasion she was lost to follow-up.  She denies any weight loss or lymphadenopathy.  She denies any family history of malignancy.  She does longer smokes.  She does have discoid lupus.  She desires surgical excision.  Past Medical History:  Diagnosis Date  . Anginal pain ([place])   . Anxiety   . Bence Jones proteinuria 02/29/2012   Random urine "M spike" 7.1% 02/13/12  . CAD (coronary artery disease)    Remote PCI to RCA in 2001, stent to the RCA in 2002, with last cath in 2010 with placement of two overlapping drug eluting  stents to the RCA  . 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 Depression   . Diabetes mellitus without complication ([place])    type 2 dx july 2018  . H/O: substance abuse   . Hyperlipidemia   . Mass on back   . Myocardial infarction [facility])    "not sure; nobody ever tells me" (04/23/2012)  . Noncompliance   . PVD (peripheral vascular disease) ([place])    MILD    Past Surgical History:  Procedure Laterality Date  . ABDOMINAL HYSTERECTOMY  1999   ovaries removed weeks later  . ANTERIOR CRUCIATE LIGAMENT REPAIR  lasy February 02 2015   "have had 15 surgeries on my right ACL"  . BREAST SURGERY     "removed knot from one side"  . CORONARY ANGIOPLASTY WITH STENT PLACEMENT  2002; 2010; 04/23/2012   "1 + 2 + 1; total of 4"  . EXCISIONAL HEMORRHOIDECTOMY  yrs ago  . HERNIA REPAIR     points to stomach  . LEFT HEART  CATHETERIZATION WITH CORONARY ANGIOGRAM N/A 04/23/2012   Procedure: LEFT HEART CATHETERIZATION WITH CORONARY ANGIOGRAM;  Surgeon: Burnell Blanks, MD;  [facility];  Service: Cardiovascular;  Laterality: N/A;  . TONSILLECTOMY  early teens    Family History  Problem Relation Age of Onset  . Hypertension Mother   . Lung cancer Mother   . Heart disease Father   . Hypertension Father   . Stroke Father   . Heart failure Father    Social History:  reports that she quit smoking about 5 years ago. Her smoking use included cigarettes. She has a 57.00 pack-year smoking history. she has never used smokeless tobacco. She reports that she drinks about 8.4 oz of alcohol per week. She reports that she uses drugs. Drug: Marijuana.  Allergies: No Known Allergies  Medications Prior to Admission  Medication Sig Dispense Refill  . acetaminophen (TYLENOL) 500 MG tablet Take 1,000 mg by mouth every 6 (six) hours as needed for mild pain. For pain     . aspirin 81 MG tablet Take 81 mg by mouth at bedtime.     Marland Kitchen atorvastatin (LIPITOR) 20 MG tablet TAKE 1 TABLET DAILY (DOSE  DECREASE) (Patient taking differently: Take 20 mgs by  mouth daily at bedtime) 90 tablet 2  . ezetimibe (ZETIA) 10 MG tablet TAKE 1 TABLET DAILY (Patient taking differently: TAKE 1 TABLET DAILY AT BEDTIME) 90 tablet 2  . hydroxychloroquine (PLAQUENIL) 200 MG tablet Take 200 mg by mouth at bedtime.     Marland Kitchen ibuprofen (ADVIL,MOTRIN) 200 MG tablet Take 400 mg by mouth daily as needed for moderate pain.    . metFORMIN (GLUCOPHAGE-XR) 500 MG 24 hr tablet Take 500 mg by mouth daily with supper.   6  . metoprolol succinate (TOPROL-XL) 25 MG 24 hr tablet Take 1 tablet (25 mg total) by mouth daily. (Patient taking differently: Take 25 mg by mouth at bedtime. ) 90 tablet 2  . amoxicillin (AMOXIL) 500 MG capsule Take 2,000 mg by mouth See admin instructions. 1 hour prior to dental work    . nitroGLYCERIN (NITROSTAT) 0.4 MG SL tablet PLACE 1  TABLET (0.4 MG TOTAL) UNDER THE TONGUE EVERY 5 (FIVE) MINUTES AS NEEDED. FOR CHEST PAIN 75 tablet 3  . zolpidem (AMBIEN) 10 MG tablet Take 10 mg by mouth at bedtime as needed for sleep.       Results for orders placed or performed during the hospital encounter of 08/29/17 (from the past 48 hour(s))  Glucose, capillary     Status: Abnormal   Collection Time: 08/29/17 11:01 AM  Result Value Ref Range   Glucose-Capillary 213 (H) 65 - 99 mg/dL  Glucose, capillary     Status: None   Collection Time: 08/29/17 12:48 PM  Result Value Ref Range   Glucose-Capillary 94 65 - 99 mg/dL   Comment 1 Notify RN    No results found.  Review of Systems  Constitutional: Negative for weight loss.  HENT: Negative for nosebleeds.   Eyes: Negative for blurred vision.  Respiratory: Negative for shortness of breath.   Cardiovascular: Negative for chest pain, palpitations, orthopnea and PND.       Denies DOE  Genitourinary: Negative for dysuria and hematuria.  Musculoskeletal: Negative.   Skin: Negative for itching and rash.  Neurological: Negative for dizziness, focal weakness, seizures, loss of consciousness and headaches.       Denies TIAs, amaurosis fugax  Endo/Heme/Allergies: Does not bruise/bleed easily.  Psychiatric/Behavioral: The patient is not nervous/anxious.     Height 4\' 11"  (1.499 m), weight 57.6 kg (127 lb). Physical Exam  Vitals reviewed. Constitutional: She is oriented to person, place, and time. She appears well-developed and well-nourished. No distress.  HENT:  Head: Normocephalic and atraumatic.  Right Ear: External ear normal.  Left Ear: External ear normal.  Eyes: Conjunctivae are normal. No scleral icterus.  Neck: Normal range of motion. Neck supple. No tracheal deviation present. No thyromegaly present.  Cardiovascular: Normal rate and normal heart sounds.  Respiratory: Effort normal and breath sounds normal. No stridor. No respiratory distress. She has no wheezes.  GI: Soft.  She exhibits no distension. There is no tenderness.  Musculoskeletal: She exhibits no edema or tenderness.  Lymphadenopathy:    She has no cervical adenopathy.  Neurological: She is alert and oriented to person, place, and time. She exhibits normal muscle tone.  Skin: Skin is warm and dry. No rash noted. She is not diaphoretic. No erythema. No pallor.     Left scapular region, along the inferior medial edge there is a soft tissue mass that is slightly mobile.  It appears to be 2.5 x 2.5 cm.  No overlying skin lesions.  Does not have typical feel of a lipoma or  a cyst.  No regional lymphadenopathy  Psychiatric: She has a normal mood and affect. Her behavior is normal. Judgment and thought content normal.     Assessment/Plan Left soft tissue mass over the scapula Diabetes mellitus type 2 Discoid lupus Coronary artery disease  The OR for excisional biopsy of left scapula mass in the soft tissue We had previously discussed risk and benefits voiding but not limited to bleeding, infection, injury to surrounding structures, seroma, hematoma, need for additional procedures, potential malignancy, perioperative cardiac and pulmonary events, blood clot formation as well as the typical recovery course  Leighton Ruff. Redmond Pulling, MD, FACS General, Bariatric, & Minimally Invasive Surgery [facility], [place]   Greer Pickerel, MD 08/29/2017, 1:45 PM

## 2017-08-30 ENCOUNTER — Encounter (HOSPITAL_COMMUNITY): Payer: Self-pay | Admitting: General Surgery

## 2017-10-02 DIAGNOSIS — H04123 Dry eye syndrome of bilateral lacrimal glands: Secondary | ICD-10-CM | POA: Diagnosis not present

## 2017-10-02 DIAGNOSIS — Z79899 Other long term (current) drug therapy: Secondary | ICD-10-CM | POA: Diagnosis not present

## 2017-10-02 DIAGNOSIS — H2513 Age-related nuclear cataract, bilateral: Secondary | ICD-10-CM | POA: Diagnosis not present

## 2017-10-02 DIAGNOSIS — M328 Other forms of systemic lupus erythematosus: Secondary | ICD-10-CM | POA: Diagnosis not present

## 2017-10-15 DIAGNOSIS — K080 Exfoliation of teeth due to systemic causes: Secondary | ICD-10-CM | POA: Diagnosis not present

## 2017-12-06 DIAGNOSIS — E782 Mixed hyperlipidemia: Secondary | ICD-10-CM | POA: Diagnosis not present

## 2017-12-06 DIAGNOSIS — E559 Vitamin D deficiency, unspecified: Secondary | ICD-10-CM | POA: Diagnosis not present

## 2017-12-06 DIAGNOSIS — E1165 Type 2 diabetes mellitus with hyperglycemia: Secondary | ICD-10-CM | POA: Diagnosis not present

## 2017-12-06 DIAGNOSIS — I10 Essential (primary) hypertension: Secondary | ICD-10-CM | POA: Diagnosis not present

## 2017-12-06 DIAGNOSIS — E119 Type 2 diabetes mellitus without complications: Secondary | ICD-10-CM | POA: Diagnosis not present

## 2017-12-16 ENCOUNTER — Telehealth: Payer: Self-pay | Admitting: Cardiovascular Disease

## 2017-12-16 NOTE — Telephone Encounter (Signed)
New Message    Patient wants to know if you can look at her lab work and check her cholesterol ,her pcp did the lab work and  [name] it was ok , but the patient thinks its higher than before

## 2017-12-16 NOTE — Telephone Encounter (Signed)
I spoke with pt. She is asking if Dr. Angelena Form can review recent lipid profile checked at primary care.  These are scanned in. No medication changes made based on these results.  Pt is currently taking Lipitor 20 mg and Zetia 10 mg.  Had been on higher dose Lipitor in the past but developed muscle aches.  (see phone note from 06/20/16).  Pt has not changed diet recently.

## 2017-12-18 NOTE — Telephone Encounter (Signed)
Follow up   Pt states she has been eating a lot of red meat

## 2017-12-20 NOTE — Telephone Encounter (Signed)
I spoke with pt. She states she would rather focus on diet control prior to adding or making any adjustments to her meds and request to have repeat labs during her 12 month f/u visit. Pt states she has retired and her diet choices have not been the best. She is aware of what she should be eating. Pt is due for 12 month f/u with Dr. Angelena Form in 03/2018. No schedule available at this time to make appt. Informed her I  would forward to his nurse to follow up on. She is in agreement with plan and thankful for the call

## 2017-12-20 NOTE — Telephone Encounter (Signed)
She is on Zetia and Lipitor 20 mg daily. If she is not able to tolerate higher doses of statins, we may need to consider Repatha or Praluent to get her LDL to 70. Can we see if she would like referral to our lipid clinic or she can discuss with her primary care doctor. Thanks, chris

## 2017-12-25 DIAGNOSIS — M542 Cervicalgia: Secondary | ICD-10-CM | POA: Diagnosis not present

## 2017-12-25 DIAGNOSIS — M546 Pain in thoracic spine: Secondary | ICD-10-CM | POA: Diagnosis not present

## 2017-12-31 NOTE — Telephone Encounter (Signed)
I spoke with pt and scheduled her to see Dr. Angelena Form on October 7,2019 at 8:00.  She will be fasting and plan to have lipid profile checked at this appt.

## 2018-01-20 DIAGNOSIS — K080 Exfoliation of teeth due to systemic causes: Secondary | ICD-10-CM | POA: Diagnosis not present

## 2018-03-20 DIAGNOSIS — E119 Type 2 diabetes mellitus without complications: Secondary | ICD-10-CM | POA: Diagnosis not present

## 2018-03-20 DIAGNOSIS — M79672 Pain in left foot: Secondary | ICD-10-CM | POA: Diagnosis not present

## 2018-03-21 DIAGNOSIS — L080 Pyoderma: Secondary | ICD-10-CM | POA: Diagnosis not present

## 2018-03-21 DIAGNOSIS — L930 Discoid lupus erythematosus: Secondary | ICD-10-CM | POA: Diagnosis not present

## 2018-03-25 ENCOUNTER — Other Ambulatory Visit: Payer: Self-pay | Admitting: Cardiovascular Disease

## 2018-03-25 MED ORDER — METOPROLOL SUCCINATE ER 25 MG PO TB24: 25.0000 mg | ORAL_TABLET | Freq: Every day | ORAL | 0 refills | Status: DC

## 2018-03-26 DIAGNOSIS — D219 Benign neoplasm of connective and other soft tissue, unspecified: Secondary | ICD-10-CM | POA: Diagnosis not present

## 2018-04-15 ENCOUNTER — Encounter

## 2018-04-20 NOTE — Progress Notes (Signed)
Chief Complaint  Patient presents with  . Follow-up    CAD   History of Present Illness: 62 yo female with history of CAD, former tobacco abuse, cutaneous lupus, anxiety, depression, HLD, PAD and DM here today for cardiac follow up. She has known CAD with prior PCI of the RCA followed by stenting of the RCA in 2002. Cath in 2010 showed that her RCA was subtotally occluded distally. She then had two overlapping drug eluting stents placed in the distal RCA. She has a total of 3 stents in the RCA. Stress test in 2013 in the setting of chest pain showed possible lateral wall ischemia. Cath October 2013 with severe stenosis in the obtuse marginal treated with a bare metal stent. Stress test August 2016 without ischemia. Echo August 2016 with normal LV systolic function.   She is here today for follow up. The patient denies any chest pain, dyspnea, palpitations, lower extremity edema, orthopnea, PND, dizziness, near syncope or syncope. She is walking every day.   Primary Care Physician: Shirline Frees, MD  Past Medical History:  Diagnosis Date  . Anginal pain ([place])   . Anxiety   . Bence Jones proteinuria 02/29/2012   Random urine "M spike" 7.1% 02/13/12  . CAD (coronary artery disease)    Remote PCI to RCA in 2001, stent to the RCA in 2002, with last cath in 2010 with placement of two overlapping drug eluting  stents to the RCA  . 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 Depression   . Diabetes mellitus without complication ([place])    type 2 dx july 2018  . H/O: substance abuse ([place])   . Hyperlipidemia   . Mass on back   . Myocardial infarction [facility])    "not sure; nobody ever tells me" (04/23/2012)  . Noncompliance   . PVD (peripheral vascular disease) ([place])    MILD    Past Surgical History:  Procedure Laterality Date  . ABDOMINAL HYSTERECTOMY  1999   ovaries removed weeks later  . ANTERIOR CRUCIATE LIGAMENT REPAIR  lasy February 02 2015   "have had 15 surgeries on my right  ACL"  . BREAST SURGERY     "removed knot from one side"  . CORONARY ANGIOPLASTY WITH STENT PLACEMENT  2002; 2010; 04/23/2012   "1 + 2 + 1; total of 4"  . EXCISIONAL HEMORRHOIDECTOMY  yrs ago  . HERNIA REPAIR     points to stomach  . LEFT HEART CATHETERIZATION WITH CORONARY ANGIOGRAM N/A 04/23/2012   Procedure: LEFT HEART CATHETERIZATION WITH CORONARY ANGIOGRAM;  Surgeon: Burnell Blanks, MD;  [facility]: [facility] CATH LAB;  Service: Cardiovascular;  Laterality: N/A;  . MASS EXCISION N/A 08/29/2017   Procedure: EXCISIONAL BIOPSY OF BACK SOFT TISSUE MASS;  Surgeon: Greer Pickerel, MD;  Location: [facility] ORS;  Service: General;  Laterality: N/A;  . TONSILLECTOMY  early teens    Current Outpatient Medications  Medication Sig Dispense Refill  . acetaminophen (TYLENOL) 500 MG tablet Take 1,000 mg by mouth every 6 (six) hours as needed for mild pain. For pain     . amoxicillin (AMOXIL) 500 MG capsule Take 2,000 mg by mouth See admin instructions. 1 hour prior to dental work    . aspirin 81 MG tablet Take 81 mg by mouth at bedtime.     Marland Kitchen atorvastatin (LIPITOR) 20 MG tablet TAKE 1 TABLET DAILY (DOSE  DECREASE) (Patient taking differently: Take 20 mg by mouth daily. ) 90 tablet 2  .  ezetimibe (ZETIA) 10 MG tablet TAKE 1 TABLET DAILY (Patient taking differently: TAKE 1 TABLET DAILY AT BEDTIME) 90 tablet 2  . hydroxychloroquine (PLAQUENIL) 200 MG tablet Take 200 mg by mouth at bedtime.     Marland Kitchen ibuprofen (ADVIL,MOTRIN) 200 MG tablet Take 400 mg by mouth daily as needed for moderate pain.    . metoprolol succinate (TOPROL-XL) 25 MG 24 hr tablet Take 1 tablet (25 mg total) by mouth daily. Please keep upcoming appt in October for future refills. Thank you 90 tablet 0  . nitroGLYCERIN (NITROSTAT) 0.4 MG SL tablet PLACE 1 TABLET (0.4 MG TOTAL) UNDER THE TONGUE EVERY 5 (FIVE) MINUTES AS NEEDED. FOR CHEST PAIN 75 tablet 3  . oxyCODONE (OXY IR/ROXICODONE) 5 MG immediate release tablet Take 1 tablet (5 mg total) by mouth  every 6 (six) hours as needed for moderate pain, severe pain or breakthrough pain. 10 tablet 0   No current facility-administered medications for this visit.     No Known Allergies  Social History   Socioeconomic History  . Marital status: Single    Spouse name: Not on file  . Number of children: Not on file  . Years of education: Not on file  . Highest education level: Not on file  Occupational History  . Not on file  Social Needs  . Financial resource strain: Not on file  . Food insecurity:    Worry: Not on file    Inability: Not on file  . Transportation needs:    Medical: Not on file    Non-medical: Not on file  Tobacco Use  . Smoking status: Former Smoker    Packs/day: 1.50    Years: 38.00    Pack years: 57.00    Types: Cigarettes    Last attempt to quit: 04/20/2012    Years since quitting: 6.0  . Smokeless tobacco: Never Used  Substance and Sexual Activity  . Alcohol use: Yes    Alcohol/week: 14.0 standard drinks    Types: 14 Cans of beer per week    Comment: beer 2 x week   . Drug use: Yes    Types: Marijuana    Comment: smoked marijuana 09-04-16  . Sexual activity: Never  Lifestyle  . Physical activity:    Days per week: Not on file    Minutes per session: Not on file  . Stress: Not on file  Relationships  . Social connections:    Talks on phone: Not on file    Gets together: Not on file    Attends religious service: Not on file    Active member of club or organization: Not on file    Attends meetings of clubs or organizations: Not on file    Relationship status: Not on file  . Intimate partner violence:    Fear of current or ex partner: Not on file    Emotionally abused: Not on file    Physically abused: Not on file    Forced sexual activity: Not on file  Other Topics Concern  . Not on file  Social History Narrative  . Not on file    Family History  Problem Relation Age of Onset  . Hypertension Mother   . Lung cancer Mother   . Heart  disease Father   . Hypertension Father   . Stroke Father   . Heart failure Father     Review of Systems:  As stated in the HPI and otherwise negative.   BP 114/80  Pulse 61   Ht 4\' 11"  (1.499 m)   Wt 129 lb 12.8 oz (58.9 kg)   BMI 26.22 kg/m   Physical Examination:  General: Well developed, well nourished, NAD  HEENT: OP clear, mucus membranes moist  SKIN: warm, dry. No rashes. Neuro: No focal deficits  Musculoskeletal: Muscle strength 5/5 all ext  Psychiatric: Mood and affect normal  Neck: No JVD, no carotid bruits, no thyromegaly, no lymphadenopathy.  Lungs:Clear bilaterally, no wheezes, rhonci, crackles Cardiovascular: Regular rate and rhythm. No murmurs, gallops or rubs. Abdomen:Soft. Bowel sounds present. Non-tender.  Extremities: No lower extremity edema. Pulses are 2 + in the bilateral DP/PT.  Echo August 2016: Left ventricle: The cavity size was normal. Systolic function was   normal. The estimated ejection fraction was in the range of 60%   to 65%. Wall motion was normal; there were no regional wall   motion abnormalities. Left ventricular diastolic function   parameters were normal.  Cardiac cath 04/23/12:  Left main: No obstructive disease.  Left Anterior Descending Artery: Large caliber vessel that courses to the apex. There is a 20% stenosis proximally. The diagonal branch is patent.  Intermediate branch: Moderate sized. No disease noted.  Circumflex Artery: Moderate caliber vessel with a long area of 50% stenosis proximally followed by a 99% stenosis leading into the large OM branch. The continuation of the AV groove Circumflex is occluded beyond the marginal branch and fills from right to left collaterals, unchanged from last cath.  Right Coronary Artery: Large, dominant vessel with diffuse 30% stenosis in the mid vessel with patent overlapping stents in the distal vessel. There is minimal in-stent restenosis. The distal AV groove Circumflex is seen to fill  from right to left collaterals.  Left Ventricular Angiogram: LVEF=50%  Impression:  1. Double vessel CAD with patent stents RCA and severe stenosis in the mid Circumflex extending into the first OM branch.  2. Low normal LV systolic function  3. Unstable angina  4. Successful PTCA/bare metal stent x 1.  EKG:  EKG is ordered today. The ekg ordered today demonstrates  NSR, rate 61 bpm. LAE  Recent Labs: 08/26/2017: BUN 17; Creatinine, Ser 0.61; Hemoglobin 11.6; Platelets 311; Potassium 4.3; Sodium 142   Lipid Panel    Component Value Date/Time   CHOL 115 02/27/2017 0831   TRIG 68 02/27/2017 0831   HDL 45 02/27/2017 0831   CHOLHDL 2.6 02/27/2017 0831   CHOLHDL 3.6 06/04/2016 0811   VLDL 14 06/04/2016 0811   LDLCALC 56 02/27/2017 0831     Wt Readings from Last 3 Encounters:  04/21/18 129 lb 12.8 oz (58.9 kg)  08/29/17 127 lb (57.6 kg)  08/26/17 127 lb 12.8 oz (58 kg)     Other studies Reviewed: Additional studies/ records that were reviewed today include: Review of the above records demonstrates:   Assessment and Plan:   1. CAD without angina: No chest pain. Continue ASA, statin and beta blocker.    2. Tobacco abuse, in remission: She no longer smokes  3. Hyperlipidemia: LDL 133 in May 2019 in primary care. See phone note discussion regarding altering medical therapy. She wanted to try diet control. She is on Zetia and Lipitor. Will repeat now. Continue statin and Zetia.   Current medicines are reviewed at length with the patient today.  The patient does not have concerns regarding medicines.  The following changes have been made:  no change  Labs/ tests ordered today include:   Orders Placed This Encounter  Procedures  . Lipid Profile  . Hepatic function panel  . EKG 12-Lead    Disposition:   FU with me in 12 months  Signed, Lauree Chandler, MD 04/21/2018 8:37 AM    [facility] [address], [place], [place]  [address] Phone: [phone]; Fax: [phone]

## 2018-04-21 ENCOUNTER — Encounter: Payer: Self-pay | Admitting: Cardiovascular Disease

## 2018-04-21 ENCOUNTER — Ambulatory Visit: Payer: Federal, State, Local not specified - PPO | Admitting: Cardiovascular Disease

## 2018-04-21 VITALS — BP 114/80 | HR 61 | Ht 59.0 in | Wt 129.8 lb

## 2018-04-21 DIAGNOSIS — I2510 Atherosclerotic heart disease of native coronary artery without angina pectoris: Secondary | ICD-10-CM

## 2018-04-21 DIAGNOSIS — F17201 Nicotine dependence, unspecified, in remission: Secondary | ICD-10-CM | POA: Diagnosis not present

## 2018-04-21 DIAGNOSIS — E7800 Pure hypercholesterolemia, unspecified: Secondary | ICD-10-CM | POA: Diagnosis not present

## 2018-04-21 LAB — HEPATIC FUNCTION PANEL
ALT: 16 IU/L (ref 0–32)
AST: 13 IU/L (ref 0–40)
Albumin: 4.5 g/dL (ref 3.5–5.5)
Alkaline Phosphatase: 77 IU/L (ref 39–117)
BILIRUBIN TOTAL: 0.2 mg/dL (ref 0.0–1.2)
BILIRUBIN, DIRECT: 0.08 mg/dL (ref 0.00–0.40)
Total Protein: 6.5 g/dL (ref 6.0–8.5)

## 2018-04-21 LAB — LIPID PANEL
CHOL/HDL RATIO: 2.8 ratio (ref 0.0–4.4)
CHOLESTEROL TOTAL: 145 mg/dL (ref 100–199)
HDL: 52 mg/dL (ref >39)
LDL CALC: 74 mg/dL (ref 0–99)
TRIGLYCERIDES: 94 mg/dL (ref 0–149)
VLDL CHOLESTEROL CAL: 19 mg/dL (ref 5–40)

## 2018-04-21 NOTE — Addendum Note (Signed)
Addended by: Maren Beach, Ikaika Showers A on: 04/21/2018 08:51 AM   Modules accepted: Orders

## 2018-04-21 NOTE — Patient Instructions (Signed)
Medication Instructions:  Your physician recommends that you continue on your current medications as directed. Please refer to the Current Medication list given to you today.\   Labwork: Lab work to be done today--Lipid and Liver profiles  Testing/Procedures: none  Follow-Up: Your physician recommends that you schedule a follow-up appointment in: 12 months. Please call our office in about 8 months to schedule this appointment    Any Other Special Instructions Will Be Listed Below (If Applicable).     If you need a refill on your cardiac medications before your next appointment, please call your pharmacy.   

## 2018-04-21 NOTE — Addendum Note (Signed)
Addended by: Maren Beach, Jaymin Waln A on: 04/21/2018 09:04 AM   Modules accepted: Orders

## 2018-04-22 DIAGNOSIS — L930 Discoid lupus erythematosus: Secondary | ICD-10-CM | POA: Diagnosis not present

## 2018-05-23 DIAGNOSIS — L930 Discoid lupus erythematosus: Secondary | ICD-10-CM | POA: Diagnosis not present

## 2018-05-28 DIAGNOSIS — Z01419 Encounter for gynecological examination (general) (routine) without abnormal findings: Secondary | ICD-10-CM | POA: Diagnosis not present

## 2018-05-28 DIAGNOSIS — Z1231 Encounter for screening mammogram for malignant neoplasm of breast: Secondary | ICD-10-CM | POA: Diagnosis not present

## 2018-05-28 DIAGNOSIS — Z6825 Body mass index (BMI) 25.0-25.9, adult: Secondary | ICD-10-CM | POA: Diagnosis not present

## 2018-06-16 DIAGNOSIS — J0100 Acute maxillary sinusitis, unspecified: Secondary | ICD-10-CM | POA: Diagnosis not present

## 2018-06-16 DIAGNOSIS — E119 Type 2 diabetes mellitus without complications: Secondary | ICD-10-CM | POA: Diagnosis not present

## 2018-06-16 DIAGNOSIS — L930 Discoid lupus erythematosus: Secondary | ICD-10-CM | POA: Diagnosis not present

## 2018-06-18 ENCOUNTER — Other Ambulatory Visit: Payer: Self-pay | Admitting: Cardiovascular Disease

## 2018-06-18 MED ORDER — EZETIMIBE 10 MG PO TABS: 10.0000 mg | ORAL_TABLET | Freq: Every day | ORAL | 3 refills | Status: DC

## 2018-06-18 MED ORDER — ATORVASTATIN CALCIUM 20 MG PO TABS: 20.0000 mg | ORAL_TABLET | Freq: Every day | ORAL | 3 refills | Status: DC

## 2018-06-18 NOTE — Telephone Encounter (Signed)
Pt's medications were sent to pt's pharmacy as requested. Confirmation received.  

## 2018-06-26 DIAGNOSIS — E119 Type 2 diabetes mellitus without complications: Secondary | ICD-10-CM | POA: Diagnosis not present

## 2018-06-26 DIAGNOSIS — I10 Essential (primary) hypertension: Secondary | ICD-10-CM | POA: Diagnosis not present

## 2018-06-26 DIAGNOSIS — F419 Anxiety disorder, unspecified: Secondary | ICD-10-CM | POA: Diagnosis not present

## 2018-06-26 DIAGNOSIS — E782 Mixed hyperlipidemia: Secondary | ICD-10-CM | POA: Diagnosis not present

## 2018-07-23 DIAGNOSIS — J0101 Acute recurrent maxillary sinusitis: Secondary | ICD-10-CM | POA: Diagnosis not present

## 2018-07-29 DIAGNOSIS — Z01818 Encounter for other preprocedural examination: Secondary | ICD-10-CM | POA: Diagnosis not present

## 2018-08-13 ENCOUNTER — Encounter: Payer: Self-pay | Admitting: Allergy

## 2018-08-13 ENCOUNTER — Ambulatory Visit: Payer: Federal, State, Local not specified - PPO | Admitting: Allergy

## 2018-08-13 VITALS — BP 130/80 | HR 71 | Resp 16 | Ht 59.0 in | Wt 130.8 lb

## 2018-08-13 DIAGNOSIS — J3089 Other allergic rhinitis: Secondary | ICD-10-CM | POA: Diagnosis not present

## 2018-08-13 DIAGNOSIS — K080 Exfoliation of teeth due to systemic causes: Secondary | ICD-10-CM | POA: Diagnosis not present

## 2018-08-13 MED ORDER — AZELASTINE HCL 0.1 % NA SOLN: 2.0000 | Freq: Two times a day (BID) | NASAL | 5 refills | Status: DC

## 2018-08-13 NOTE — Patient Instructions (Addendum)
-   environmental allergy skin testing is positive to cats, dog and molds (alternaria and cladosporium) - allergen avoidance measures discussed/handouts provided - recommend having home remedied as likely large contributor to mold exposure - use Flonase 2 sprays each nostril daily for nasal congestion.  Use for 1-2 weeks at a time before stopping once symptoms improve - use Astelin nasal antihistamine 1-2 sprays each nostril 1-2 times a day for nasal drainage.  - recommend use of saline rinses to help flush/clean out the nose.  Use prior to medicated nasal spray. Rinse kit provided.  Use with either distilled water or boil water and bring to room temperature prior use.   - for allergy symptom control recommend use of either Allegra '180mg'$ , Xyzal '5mg'$  or Zyrtec '10mg'$  daily as needed  Follow-up 3-4 months or sooner if needed
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
## 2018-09-04 ENCOUNTER — Other Ambulatory Visit: Payer: Self-pay | Admitting: Cardiovascular Disease

## 2018-09-15 DIAGNOSIS — D125 Benign neoplasm of sigmoid colon: Secondary | ICD-10-CM | POA: Diagnosis not present

## 2018-09-15 DIAGNOSIS — Z1211 Encounter for screening for malignant neoplasm of colon: Secondary | ICD-10-CM | POA: Diagnosis not present

## 2018-09-15 DIAGNOSIS — K635 Polyp of colon: Secondary | ICD-10-CM | POA: Diagnosis not present

## 2018-09-17 DIAGNOSIS — D125 Benign neoplasm of sigmoid colon: Secondary | ICD-10-CM | POA: Diagnosis not present

## 2018-09-17 DIAGNOSIS — K635 Polyp of colon: Secondary | ICD-10-CM | POA: Diagnosis not present

## 2018-10-02 DIAGNOSIS — Z79899 Other long term (current) drug therapy: Secondary | ICD-10-CM | POA: Diagnosis not present

## 2018-10-02 DIAGNOSIS — H04123 Dry eye syndrome of bilateral lacrimal glands: Secondary | ICD-10-CM | POA: Diagnosis not present

## 2018-10-02 DIAGNOSIS — M329 Systemic lupus erythematosus, unspecified: Secondary | ICD-10-CM | POA: Diagnosis not present

## 2018-10-02 DIAGNOSIS — H2513 Age-related nuclear cataract, bilateral: Secondary | ICD-10-CM | POA: Diagnosis not present

## 2018-10-29 ENCOUNTER — Ambulatory Visit: Payer: Federal, State, Local not specified - PPO | Admitting: Allergy

## 2018-12-22 ENCOUNTER — Telehealth: Payer: Self-pay | Admitting: Cardiovascular Disease

## 2018-12-22 NOTE — Telephone Encounter (Signed)
New Message:    Pt wants to know if whenever she goes to the dentist, does she need to take an antibiotic?

## 2018-12-22 NOTE — Telephone Encounter (Signed)
I spoke to the patient and clarified her question about antibiotics prior to dental work.  She verbalized understanding.

## 2018-12-26 DIAGNOSIS — E119 Type 2 diabetes mellitus without complications: Secondary | ICD-10-CM | POA: Diagnosis not present

## 2018-12-26 DIAGNOSIS — I10 Essential (primary) hypertension: Secondary | ICD-10-CM | POA: Diagnosis not present

## 2018-12-26 DIAGNOSIS — E559 Vitamin D deficiency, unspecified: Secondary | ICD-10-CM | POA: Diagnosis not present

## 2018-12-26 DIAGNOSIS — E782 Mixed hyperlipidemia: Secondary | ICD-10-CM | POA: Diagnosis not present

## 2019-01-06 DIAGNOSIS — L930 Discoid lupus erythematosus: Secondary | ICD-10-CM | POA: Diagnosis not present

## 2019-02-05 DIAGNOSIS — L930 Discoid lupus erythematosus: Secondary | ICD-10-CM | POA: Diagnosis not present

## 2019-02-10 ENCOUNTER — Telehealth: Payer: Self-pay | Admitting: Cardiovascular Disease

## 2019-02-10 DIAGNOSIS — M79605 Pain in left leg: Secondary | ICD-10-CM

## 2019-02-10 DIAGNOSIS — M79604 Pain in right leg: Secondary | ICD-10-CM

## 2019-02-10 NOTE — Telephone Encounter (Signed)
New Message           Patient is calling in today to discuss her walking. Patient states that her calves feel like a rubber band is wrap around them while she is walking, she is not sure if it's heart related, but would like a call back  Concerning this matter.

## 2019-02-10 NOTE — Telephone Encounter (Signed)
Pt states she started walking again back in April.  Walks one mile, 4 days per week.  Over the last two weeks she has noticed that about a half mile in she develops pain in both calves that feels like the has rubber bands squeezing them.  She pushes through and finishes the mile each time and the symptoms resolve once she stops walking and gets to her car.  Denies SOB, CP, swelling, redness or heat to legs.  She said only other thing is they just feel heavy when this occurs.  Advised I will send to Dr. Angelena Form for review.

## 2019-02-10 NOTE — Telephone Encounter (Signed)
Let's set her up for LE arterial dopplers with ABI. Thanks, chris

## 2019-02-10 NOTE — Telephone Encounter (Signed)
Spoke with pt and went over recommendations.  Made pt aware that this study is performed at our [facility] office.  Advised to watch for call from scheduling dept to get this scheduled.  Pt appreciative for call.

## 2019-02-13 ENCOUNTER — Other Ambulatory Visit: Payer: Self-pay | Admitting: Cardiovascular Disease

## 2019-02-13 DIAGNOSIS — M79604 Pain in right leg: Secondary | ICD-10-CM

## 2019-02-13 DIAGNOSIS — M79605 Pain in left leg: Secondary | ICD-10-CM

## 2019-02-20 ENCOUNTER — Other Ambulatory Visit: Payer: Self-pay

## 2019-02-20 ENCOUNTER — Ambulatory Visit (HOSPITAL_COMMUNITY)
Admission: RE | Admit: 2019-02-20 | Discharge: 2019-02-20 | Disposition: A | Discharge status: Home / Self Care | Payer: Federal, State, Local not specified - PPO | Source: Ambulatory Visit | Attending: Cardiovascular Disease | Admitting: Cardiovascular Disease

## 2019-02-20 DIAGNOSIS — M79604 Pain in right leg: Secondary | ICD-10-CM | POA: Diagnosis not present

## 2019-02-20 DIAGNOSIS — M79605 Pain in left leg: Secondary | ICD-10-CM | POA: Diagnosis not present

## 2019-02-23 ENCOUNTER — Telehealth: Payer: Self-pay | Admitting: Cardiovascular Disease

## 2019-02-23 NOTE — Telephone Encounter (Signed)
I spoke with pt and reviewed doppler results with her.  I scheduled her to see Dr. Fletcher Anon tomorrow at 1:20 at [facility] office.  Pt aware to wear mask and arrive 15 minutes prior to appointment.

## 2019-02-23 NOTE — Telephone Encounter (Signed)
New Message   Patient is returning call in reference to her doppler study results. Please call to discuss.

## 2019-02-24 ENCOUNTER — Ambulatory Visit: Payer: Federal, State, Local not specified - PPO | Admitting: Cardiovascular Disease

## 2019-02-24 ENCOUNTER — Other Ambulatory Visit: Payer: Self-pay

## 2019-02-24 ENCOUNTER — Encounter: Payer: Self-pay | Admitting: Cardiovascular Disease

## 2019-02-24 VITALS — BP 108/70 | HR 69 | Temp 97.3°F | Ht 59.0 in | Wt 130.2 lb

## 2019-02-24 DIAGNOSIS — E7800 Pure hypercholesterolemia, unspecified: Secondary | ICD-10-CM

## 2019-02-24 DIAGNOSIS — I2510 Atherosclerotic heart disease of native coronary artery without angina pectoris: Secondary | ICD-10-CM

## 2019-02-24 DIAGNOSIS — I739 Peripheral vascular disease, unspecified: Secondary | ICD-10-CM | POA: Diagnosis not present

## 2019-02-24 NOTE — Progress Notes (Signed)
Cardiology Office Note   Date:  02/24/2019   ID:  [name], DOB Nov 02, 1955, MRN [id number]  PCP:  Shirline Frees, MD  Cardiologist: Dr. Angelena Form  No chief complaint on file.     History of Present Illness: [name] is a 63 y.o. female who was referred by Dr. Angelena Form for evaluation and management of peripheral arterial disease.  She has known history of coronary artery disease, previous tobacco use, hyperlipidemia, diabetes, percutaneous lupus and peripheral arterial disease. She started having bilateral calf discomfort with walking few months ago.  The pain is described as tightness and squeezing feeling in both calves worse on the right side than the left side.  This happens after walking about half a mile and then she has to rest for a few minutes before she can resume walking.  She is able to finish 1 mile of walking almost every day.  She has no rest pain or lower extremity ulceration.  No chest pain or shortness of breath.  Recent noninvasive vascular studies showed an ABI of 0.88 on the right and 0.83 on the left.  Duplex showed moderate right SFA disease with two-vessel runoff below the knee.  On the left, there was borderline significant left SFA disease with three-vessel runoff below the knee.   Past Medical History:  Diagnosis Date  . Anginal pain ([place])   . Anxiety   . Bence Jones proteinuria 02/29/2012   Random urine "M spike" 7.1% 02/13/12  . CAD (coronary artery disease)    Remote PCI to RCA in 2001, stent to the RCA in 2002, with last cath in 2010 with placement of two overlapping drug eluting  stents to the RCA  . 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 Depression   . Diabetes mellitus without complication ([place])    type 2 dx july 2018  . H/O: substance abuse ([place])   . Hyperlipidemia   . Mass on back   . Myocardial infarction [facility])    "not sure; nobody ever tells me" (04/23/2012)  . Noncompliance   . PVD (peripheral vascular disease)  ([place])    MILD    Past Surgical History:  Procedure Laterality Date  . ABDOMINAL HYSTERECTOMY  1999   ovaries removed weeks later  . ANTERIOR CRUCIATE LIGAMENT REPAIR  lasy February 02 2015   "have had 15 surgeries on my right ACL"  . BREAST SURGERY     "removed knot from one side"  . CORONARY ANGIOPLASTY WITH STENT PLACEMENT  2002; 2010; 04/23/2012   "1 + 2 + 1; total of 4"  . EXCISIONAL HEMORRHOIDECTOMY  yrs ago  . HERNIA REPAIR     points to stomach  . LEFT HEART CATHETERIZATION WITH CORONARY ANGIOGRAM N/A 04/23/2012   Procedure: LEFT HEART CATHETERIZATION WITH CORONARY ANGIOGRAM;  Surgeon: Burnell Blanks, MD;  Location: [facility] CATH LAB;  Service: Cardiovascular;  Laterality: N/A;  . MASS EXCISION N/A 08/29/2017   Procedure: EXCISIONAL BIOPSY OF BACK SOFT TISSUE MASS;  Surgeon: Greer Pickerel, MD;  Location: [facility] ORS;  Service: General;  Laterality: N/A;  . TONSILLECTOMY  early teens     Current Outpatient Medications  Medication Sig Dispense Refill  . acetaminophen (TYLENOL) 500 MG tablet Take 1,000 mg by mouth every 6 (six) hours as needed for mild pain. For pain     . aspirin 81 MG tablet Take 81 mg by mouth at bedtime.     Marland Kitchen atorvastatin (LIPITOR) 20 MG tablet Take 1  tablet (20 mg total) by mouth daily. 90 tablet 3  . azelastine (ASTELIN) 0.1 % nasal spray Place 2 sprays into both nostrils 2 (two) times daily. (Patient taking differently: Place 2 sprays into both nostrils as needed. ) 30 mL 5  . Cholecalciferol (HM VITAMIN D3) 100 MCG (4000 UT) CAPS Take by mouth daily.    Marland Kitchen ezetimibe (ZETIA) 10 MG tablet Take 1 tablet (10 mg total) by mouth daily. 90 tablet 3  . fluticasone (FLONASE) 50 MCG/ACT nasal spray as needed.     . hydroxychloroquine (PLAQUENIL) 200 MG tablet Take 200 mg by mouth at bedtime.     Marland Kitchen ibuprofen (ADVIL,MOTRIN) 200 MG tablet Take 400 mg by mouth daily as needed for moderate pain.    Marland Kitchen LORazepam (ATIVAN) 0.5 MG tablet     . metoprolol succinate (TOPROL-XL) 25 MG  24 hr tablet TAKE 1 TABLET DAILY. PLEASEKEEP UPCOMING APPOINTMENT  IN OCTOBER FOR FUTURE      REFILLS. THANK YOU 90 tablet 3  . nitroGLYCERIN (NITROSTAT) 0.4 MG SL tablet PLACE 1 TABLET (0.4 MG TOTAL) UNDER THE TONGUE EVERY 5 (FIVE) MINUTES AS NEEDED. FOR CHEST PAIN 75 tablet 3  . sertraline (ZOLOFT) 50 MG tablet     . triamcinolone ointment (KENALOG) 0.1 %     . oxyCODONE (OXY IR/ROXICODONE) 5 MG immediate release tablet Take 1 tablet (5 mg total) by mouth every 6 (six) hours as needed for moderate pain, severe pain or breakthrough pain. (Patient not taking: Reported on 02/24/2019) 10 tablet 0   No current facility-administered medications for this visit.     Allergies:   Patient has no known allergies.    Social History:  The patient  reports that she quit smoking about 6 years ago. Her smoking use included cigarettes. She has a 57.00 pack-year smoking history. She has never used smokeless tobacco. She reports current alcohol use of about 14.0 standard drinks of alcohol per week. She reports current drug use. Drug: Marijuana.   Family History:  The patient's family history includes Heart disease in her father; Heart failure in her father; Hypertension in her father and mother; Lung cancer in her mother; Stroke in her father.    ROS:  Please see the history of present illness.   Otherwise, review of systems are positive for none.   All other systems are reviewed and negative.    PHYSICAL EXAM: VS:  BP 108/70   Pulse 69   Temp (!) 97.3 F (36.3 C)   Ht 4\' 11"  (1.499 m)   Wt 130 lb 3.2 oz (59.1 kg)   SpO2 96%   BMI 26.30 kg/m  , BMI Body mass index is 26.3 kg/m. GEN: Well nourished, well developed, in no acute distress  HEENT: normal  Neck: no JVD, carotid bruits, or masses Cardiac: RRR; no  rubs, or gallops,no edema .  1/ 6 systolic murmur in the aortic area Respiratory:  clear to auscultation bilaterally, normal work of breathing GI: soft, nontender, nondistended, + BS MS: no  deformity or atrophy  Skin: warm and dry, no rash Neuro:  Strength and sensation are intact Psych: euthymic mood, full affect Vascular: Femoral pulses +1 bilaterally.  Distal pulses are not palpable.   EKG:  EKG is ordered today. The ekg ordered today demonstrates normal sinus rhythm with nonspecific ST changes   Recent Labs: 04/21/2018: ALT 16    Lipid Panel    Component Value Date/Time   CHOL 145 04/21/2018 0836   TRIG 94  04/21/2018 0836   HDL 52 04/21/2018 0836   CHOLHDL 2.8 04/21/2018 0836   CHOLHDL 3.6 06/04/2016 0811   VLDL 14 06/04/2016 0811   LDLCALC 74 04/21/2018 0836      Wt Readings from Last 3 Encounters:  02/24/19 130 lb 3.2 oz (59.1 kg)  08/13/18 130 lb 12.8 oz (59.3 kg)  04/21/18 129 lb 12.8 oz (58.9 kg)       No flowsheet data found.    ASSESSMENT AND PLAN:  1.  Peripheral arterial disease: Currently with mild bilateral claudication Rutherford class II.  She does not have claudication with regular home activities but only when she goes on her 1 mile walk daily.  Her symptoms are mild and clearly not lifestyle limiting.  I discussed with her the natural history and management of claudication.  This is not a limb threatening situation.  I advised her to continue medical therapy for now and increase her daily walking. If there is worsening in symptoms in the future, angiography and possible endovascular intervention can be considered.  2.  Coronary artery disease involving native coronary arteries without angina: She is doing well overall.  3.  Essential hypertension: Blood pressures controlled.  4.  Hyperlipidemia: Continue treatment with atorvastatin and Zetia.  Lipid profile last year showed an LDL of 74 which is close to target.    Disposition:   FU with me in 6 months  Signed,  Kathlyn Sacramento, MD  02/24/2019 1:48 PM    [place]

## 2019-02-24 NOTE — Patient Instructions (Signed)
Medication Instructions:  Your Physician recommend you continue on your current medication as directed.    If you need a refill on your cardiac medications before your next appointment, please call your pharmacy.   Lab work: None  Testing/Procedures: None  Follow-Up: At [facility], you and your health needs are our priority.  As part of our continuing mission to provide you with exceptional heart care, we have created designated Provider Care Teams.  These Care Teams include your primary Cardiologist (physician) and Advanced Practice Providers (APPs -  Physician Assistants and Nurse Practitioners) who all work together to provide you with the care you need, when you need it. You will need a follow up appointment in 6 months.  Please call our office 2 months in advance to schedule this appointment.  You may see Dr. Arida or one of the following Advanced Practice Providers on your designated Care Team:   Luke Kilroy, PA-C Krista Kroeger, PA-C . Callie Goodrich, PA-C    

## 2019-03-05 DIAGNOSIS — L932 Other local lupus erythematosus: Secondary | ICD-10-CM | POA: Diagnosis not present

## 2019-05-07 ENCOUNTER — Encounter: Payer: Self-pay | Admitting: Cardiovascular Disease

## 2019-05-07 ENCOUNTER — Ambulatory Visit: Payer: Federal, State, Local not specified - PPO | Admitting: Cardiovascular Disease

## 2019-05-07 ENCOUNTER — Other Ambulatory Visit: Payer: Self-pay

## 2019-05-07 VITALS — BP 104/64 | HR 71 | Ht 59.0 in | Wt 131.1 lb

## 2019-05-07 DIAGNOSIS — E7800 Pure hypercholesterolemia, unspecified: Secondary | ICD-10-CM | POA: Diagnosis not present

## 2019-05-07 DIAGNOSIS — I2510 Atherosclerotic heart disease of native coronary artery without angina pectoris: Secondary | ICD-10-CM | POA: Diagnosis not present

## 2019-05-07 DIAGNOSIS — I739 Peripheral vascular disease, unspecified: Secondary | ICD-10-CM | POA: Diagnosis not present

## 2019-05-07 MED ORDER — METOPROLOL SUCCINATE ER 25 MG PO TB24: 25.0000 mg | ORAL_TABLET | Freq: Every day | ORAL | 3 refills | Status: DC

## 2019-05-07 MED ORDER — ATORVASTATIN CALCIUM 20 MG PO TABS: 20.0000 mg | ORAL_TABLET | Freq: Every day | ORAL | 3 refills | Status: DC

## 2019-05-07 MED ORDER — EZETIMIBE 10 MG PO TABS: 10.0000 mg | ORAL_TABLET | Freq: Every day | ORAL | 3 refills | Status: DC

## 2019-05-07 MED ORDER — NITROGLYCERIN 0.4 MG SL SUBL: SUBLINGUAL_TABLET | SUBLINGUAL | 3 refills | Status: DC

## 2019-05-07 NOTE — Progress Notes (Signed)
Chief Complaint  Patient presents with  . Follow-up    CAD   History of Present Illness: 63 yo female with history of CAD, former tobacco abuse, cutaneous lupus, anxiety, depression, HLD, PAD and DM here today for cardiac follow up. She has known CAD with prior PCI of the RCA followed by stenting of the RCA in 2002. Cath in 2010 showed that her RCA was subtotally occluded distally. She then had two overlapping drug eluting stents placed in the distal RCA. She has a total of 3 stents in the RCA. Stress test in 2013 in the setting of chest pain showed possible lateral wall ischemia. Cath October 2013 with severe stenosis in the obtuse marginal treated with a bare metal stent. Stress test August 2016 without ischemia. Echo August 2016 with normal LV systolic function.   She is here today for follow up. The patient denies any chest pain, dyspnea, palpitations, lower extremity edema, orthopnea, PND, dizziness, near syncope or syncope.   Primary Care Physician: Shirline Frees, MD  Past Medical History:  Diagnosis Date  . Anginal pain ([place])   . Anxiety   . Bence Jones proteinuria 02/29/2012   Random urine "M spike" 7.1% 02/13/12  . CAD (coronary artery disease)    Remote PCI to RCA in 2001, stent to the RCA in 2002, with last cath in 2010 with placement of two overlapping drug eluting  stents to the RCA  . 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 Depression   . Diabetes mellitus without complication ([place])    type 2 dx july 2018  . H/O: substance abuse ([place])   . Hyperlipidemia   . Mass on back   . Myocardial infarction [facility])    "not sure; nobody ever tells me" (04/23/2012)  . Noncompliance   . PVD (peripheral vascular disease) ([place])    MILD    Past Surgical History:  Procedure Laterality Date  . ABDOMINAL HYSTERECTOMY  1999   ovaries removed weeks later  . ANTERIOR CRUCIATE LIGAMENT REPAIR  lasy February 02 2015   "have had 15 surgeries on my right ACL"  . BREAST SURGERY     "removed knot from one side"  . CORONARY ANGIOPLASTY WITH STENT PLACEMENT  2002; 2010; 04/23/2012   "1 + 2 + 1; total of 4"  . EXCISIONAL HEMORRHOIDECTOMY  yrs ago  . HERNIA REPAIR     points to stomach  . LEFT HEART CATHETERIZATION WITH CORONARY ANGIOGRAM N/A 04/23/2012   Procedure: LEFT HEART CATHETERIZATION WITH CORONARY ANGIOGRAM;  Surgeon: Burnell Blanks, MD;  Location: [facility] CATH LAB;  Service: Cardiovascular;  Laterality: N/A;  . MASS EXCISION N/A 08/29/2017   Procedure: EXCISIONAL BIOPSY OF BACK SOFT TISSUE MASS;  Surgeon: Greer Pickerel, MD;  Location: [facility] ORS;  Service: General;  Laterality: N/A;  . TONSILLECTOMY  early teens    Current Outpatient Medications  Medication Sig Dispense Refill  . acetaminophen (TYLENOL) 500 MG tablet Take 1,000 mg by mouth every 6 (six) hours as needed for mild pain. For pain     . amoxicillin (AMOXIL) 500 MG capsule TAKE 4 CAPSULES BY MOUTH 1 HOUR BEFORE TO DENTAL APPOINTMENT    . aspirin 81 MG tablet Take 81 mg by mouth at bedtime.     Marland Kitchen atorvastatin (LIPITOR) 20 MG tablet Take 1 tablet (20 mg total) by mouth daily. 90 tablet 3  . azelastine (ASTELIN) 0.1 % nasal spray Place 2 sprays into both nostrils 2 (two) times daily. [place]  mL 5  . Cholecalciferol (HM VITAMIN D3) 100 MCG (4000 UT) CAPS Take by mouth daily.    Marland Kitchen ezetimibe (ZETIA) 10 MG tablet Take 1 tablet (10 mg total) by mouth daily. 90 tablet 3  . fluticasone (FLONASE) 50 MCG/ACT nasal spray as needed.     . hydroxychloroquine (PLAQUENIL) 200 MG tablet Take 200 mg by mouth at bedtime.     Marland Kitchen ibuprofen (ADVIL,MOTRIN) 200 MG tablet Take 400 mg by mouth daily as needed for moderate pain.    Marland Kitchen LORazepam (ATIVAN) 0.5 MG tablet     . metoprolol succinate (TOPROL-XL) 25 MG 24 hr tablet Take 1 tablet (25 mg total) by mouth daily. 90 tablet 3  . nitroGLYCERIN (NITROSTAT) 0.4 MG SL tablet PLACE 1 TABLET (0.4 MG TOTAL) UNDER THE TONGUE EVERY 5 (FIVE) MINUTES AS NEEDED. FOR CHEST PAIN 75 tablet 3  .  oxyCODONE (OXY IR/ROXICODONE) 5 MG immediate release tablet Take 1 tablet (5 mg total) by mouth every 6 (six) hours as needed for moderate pain, severe pain or breakthrough pain. 10 tablet 0  . sertraline (ZOLOFT) 50 MG tablet     . triamcinolone ointment (KENALOG) 0.1 %      No current facility-administered medications for this visit.     No Known Allergies  Social History   Socioeconomic History  . Marital status: Single    Spouse name: Not on file  . Number of children: Not on file  . Years of education: Not on file  . Highest education level: Not on file  Occupational History  . Not on file  Social Needs  . Financial resource strain: Not on file  . Food insecurity    Worry: Not on file    Inability: Not on file  . Transportation needs    Medical: Not on file    Non-medical: Not on file  Tobacco Use  . Smoking status: Former Smoker    Packs/day: 1.50    Years: 38.00    Pack years: 57.00    Types: Cigarettes    Quit date: 04/20/2012    Years since quitting: 7.0  . Smokeless tobacco: Never Used  Substance and Sexual Activity  . Alcohol use: Yes    Alcohol/week: 14.0 standard drinks    Types: 14 Cans of beer per week    Comment: beer 2 x week   . Drug use: Yes    Types: Marijuana    Comment: smoked marijuana 09-04-16  . Sexual activity: Never  Lifestyle  . Physical activity    Days per week: Not on file    Minutes per session: Not on file  . Stress: Not on file  Relationships  . Social Herbalist on phone: Not on file    Gets together: Not on file    Attends religious service: Not on file    Active member of club or organization: Not on file    Attends meetings of clubs or organizations: Not on file    Relationship status: Not on file  . Intimate partner violence    Fear of current or ex partner: Not on file    Emotionally abused: Not on file    Physically abused: Not on file    Forced sexual activity: Not on file  Other Topics Concern  . Not on  file  Social History Narrative  . Not on file    Family History  Problem Relation Age of Onset  . Hypertension Mother   .  Lung cancer Mother   . Heart disease Father   . Hypertension Father   . Stroke Father   . Heart failure Father     Review of Systems:  As stated in the HPI and otherwise negative.   BP 104/64   Pulse 71   Ht 4\' 11"  (1.499 m)   Wt 131 lb 1.9 oz (59.5 kg)   SpO2 98%   BMI 26.48 kg/m   Physical Examination:  General: Well developed, well nourished, NAD  HEENT: OP clear, mucus membranes moist  SKIN: warm, dry. No rashes. Neuro: No focal deficits  Musculoskeletal: Muscle strength 5/5 all ext  Psychiatric: Mood and affect normal  Neck: No JVD, no carotid bruits, no thyromegaly, no lymphadenopathy.  Lungs:Clear bilaterally, no wheezes, rhonci, crackles Cardiovascular: Regular rate and rhythm. No murmurs, gallops or rubs. Abdomen:Soft. Bowel sounds present. Non-tender.  Extremities: No lower extremity edema. Pulses are 2 + in the bilateral DP/PT.  Echo August 2016: Left ventricle: The cavity size was normal. Systolic function was   normal. The estimated ejection fraction was in the range of 60%   to 65%. Wall motion was normal; there were no regional wall   motion abnormalities. Left ventricular diastolic function   parameters were normal.  Cardiac cath 04/23/12:  Left main: No obstructive disease.  Left Anterior Descending Artery: Large caliber vessel that courses to the apex. There is a 20% stenosis proximally. The diagonal branch is patent.  Intermediate branch: Moderate sized. No disease noted.  Circumflex Artery: Moderate caliber vessel with a long area of 50% stenosis proximally followed by a 99% stenosis leading into the large OM branch. The continuation of the AV groove Circumflex is occluded beyond the marginal branch and fills from right to left collaterals, unchanged from last cath.  Right Coronary Artery: Large, dominant vessel with diffuse  30% stenosis in the mid vessel with patent overlapping stents in the distal vessel. There is minimal in-stent restenosis. The distal AV groove Circumflex is seen to fill from right to left collaterals.  Left Ventricular Angiogram: LVEF=50%  Impression:  1. Double vessel CAD with patent stents RCA and severe stenosis in the mid Circumflex extending into the first OM branch.  2. Low normal LV systolic function  3. Unstable angina  4. Successful PTCA/bare metal stent x 1.  EKG:  EKG is not ordered today. The ekg ordered today demonstrates    Recent Labs: No results found for requested labs within last 8760 hours.   Lipid Panel    Component Value Date/Time   CHOL 145 04/21/2018 0836   TRIG 94 04/21/2018 0836   HDL 52 04/21/2018 0836   CHOLHDL 2.8 04/21/2018 0836   CHOLHDL 3.6 06/04/2016 0811   VLDL 14 06/04/2016 0811   LDLCALC 74 04/21/2018 0836     Wt Readings from Last 3 Encounters:  05/07/19 131 lb 1.9 oz (59.5 kg)  02/24/19 130 lb 3.2 oz (59.1 kg)  08/13/18 130 lb 12.8 oz (59.3 kg)     Other studies Reviewed: Additional studies/ records that were reviewed today include: Review of the above records demonstrates:   Assessment and Plan:   1. CAD without angina: She has no chest pain. Will continue ASA, beta blocker and statin.     2. Tobacco abuse, in remission: She no longer smokes  3. Hyperlipidemia: LDL near goal in 2019. Continue statin and Zetia. Will repeat lipids and LFTS now  4. PAD: [facility] in [facility] by Dr. Fletcher Anon. Last seen August  2020 and medical management recommended for mild PAD.   Current medicines are reviewed at length with the patient today.  The patient does not have concerns regarding medicines.  The following changes have been made:  no change  Labs/ tests ordered today include:   Orders Placed This Encounter  Procedures  . Lipid panel  . Hepatic function panel    Disposition:   FU with me in 12 months  Signed, Lauree Chandler, MD 05/07/2019 4:44 PM    [facility] [address], [place], [place]  [address] Phone: [phone]; Fax: [phone]

## 2019-05-07 NOTE — Patient Instructions (Signed)
Medication Instructions:  No changes *If you need a refill on your cardiac medications before your next appointment, please call your pharmacy*  Lab Work: Tomorrow: lipids/liver function  If you have labs (blood work) drawn today and your tests are completely normal, you will receive your results only by: Marland Kitchen MyChart Message (if you have MyChart) OR . A paper copy in the mail If you have any lab test that is abnormal or we need to change your treatment, we will call you to review the results.  Testing/Procedures: none  Follow-Up: At [facility], you and your health needs are our priority.  As part of our continuing mission to provide you with exceptional heart care, we have created designated Provider Care Teams.  These Care Teams include your primary Cardiologist (physician) and Advanced Practice Providers (APPs -  Physician Assistants and Nurse Practitioners) who all work together to provide you with the care you need, when you need it.  Your next appointment:   12 months  The format for your next appointment:   In Person  Provider:   Lauree Chandler, MD  Other Instructions

## 2019-05-07 NOTE — Addendum Note (Signed)
Addended by: Rodman Key on: 05/07/2019 05:52 PM   Modules accepted: Orders

## 2019-05-08 ENCOUNTER — Other Ambulatory Visit: Payer: Federal, State, Local not specified - PPO | Admitting: *Deleted

## 2019-05-08 DIAGNOSIS — E7800 Pure hypercholesterolemia, unspecified: Secondary | ICD-10-CM

## 2019-05-08 DIAGNOSIS — I2510 Atherosclerotic heart disease of native coronary artery without angina pectoris: Secondary | ICD-10-CM

## 2019-05-08 DIAGNOSIS — I739 Peripheral vascular disease, unspecified: Secondary | ICD-10-CM

## 2019-05-08 LAB — LIPID PANEL
Chol/HDL Ratio: 2.5 ratio (ref 0.0–4.4)
Cholesterol, Total: 110 mg/dL (ref 100–199)
HDL: 44 mg/dL (ref >39)
LDL Chol Calc (NIH): 52 mg/dL (ref 0–99)
Triglycerides: 65 mg/dL (ref 0–149)
VLDL Cholesterol Cal: 14 mg/dL (ref 5–40)

## 2019-05-08 LAB — HEPATIC FUNCTION PANEL
ALT: 26 IU/L (ref 0–32)
AST: 26 IU/L (ref 0–40)
Albumin: 4.6 g/dL (ref 3.8–4.9)
Alkaline Phosphatase: 72 IU/L (ref 39–117)
Bilirubin Total: 0.3 mg/dL (ref 0.0–1.2)
Bilirubin, Direct: 0.06 mg/dL (ref 0.00–0.40)
Total Protein: 6.6 g/dL (ref 6.0–8.5)

## 2019-05-29 DIAGNOSIS — S39012A Strain of muscle, fascia and tendon of lower back, initial encounter: Secondary | ICD-10-CM | POA: Diagnosis not present

## 2019-06-02 DIAGNOSIS — K625 Hemorrhage of anus and rectum: Secondary | ICD-10-CM | POA: Diagnosis not present

## 2019-06-02 DIAGNOSIS — E119 Type 2 diabetes mellitus without complications: Secondary | ICD-10-CM | POA: Diagnosis not present

## 2019-06-02 DIAGNOSIS — M545 Low back pain: Secondary | ICD-10-CM | POA: Diagnosis not present

## 2019-06-02 DIAGNOSIS — R1030 Lower abdominal pain, unspecified: Secondary | ICD-10-CM | POA: Diagnosis not present

## 2019-06-18 DIAGNOSIS — R109 Unspecified abdominal pain: Secondary | ICD-10-CM | POA: Diagnosis not present

## 2019-06-18 DIAGNOSIS — M545 Low back pain: Secondary | ICD-10-CM | POA: Diagnosis not present

## 2019-07-01 DIAGNOSIS — Z1231 Encounter for screening mammogram for malignant neoplasm of breast: Secondary | ICD-10-CM | POA: Diagnosis not present

## 2019-07-01 DIAGNOSIS — Z6827 Body mass index (BMI) 27.0-27.9, adult: Secondary | ICD-10-CM | POA: Diagnosis not present

## 2019-07-01 DIAGNOSIS — Z01419 Encounter for gynecological examination (general) (routine) without abnormal findings: Secondary | ICD-10-CM | POA: Diagnosis not present

## 2019-07-01 DIAGNOSIS — Z13820 Encounter for screening for osteoporosis: Secondary | ICD-10-CM | POA: Diagnosis not present

## 2019-07-07 DIAGNOSIS — Z20828 Contact with and (suspected) exposure to other viral communicable diseases: Secondary | ICD-10-CM | POA: Diagnosis not present

## 2019-08-13 DIAGNOSIS — Z1321 Encounter for screening for nutritional disorder: Secondary | ICD-10-CM | POA: Diagnosis not present

## 2019-08-13 DIAGNOSIS — Z1329 Encounter for screening for other suspected endocrine disorder: Secondary | ICD-10-CM | POA: Diagnosis not present

## 2019-08-25 ENCOUNTER — Ambulatory Visit: Payer: Federal, State, Local not specified - PPO | Admitting: Cardiovascular Disease

## 2019-08-25 ENCOUNTER — Encounter (INDEPENDENT_AMBULATORY_CARE_PROVIDER_SITE_OTHER): Payer: Self-pay

## 2019-08-25 ENCOUNTER — Other Ambulatory Visit: Payer: Self-pay

## 2019-08-25 VITALS — BP 128/80 | HR 64 | Temp 97.0°F | Ht 59.0 in | Wt 135.0 lb

## 2019-08-25 DIAGNOSIS — I739 Peripheral vascular disease, unspecified: Secondary | ICD-10-CM | POA: Diagnosis not present

## 2019-08-25 DIAGNOSIS — I2510 Atherosclerotic heart disease of native coronary artery without angina pectoris: Secondary | ICD-10-CM

## 2019-08-25 DIAGNOSIS — E7800 Pure hypercholesterolemia, unspecified: Secondary | ICD-10-CM | POA: Diagnosis not present

## 2019-08-25 DIAGNOSIS — I10 Essential (primary) hypertension: Secondary | ICD-10-CM

## 2019-08-25 NOTE — Progress Notes (Signed)
Cardiology Office Note   Date:  08/25/2019   ID:  [name], DOB 27-Apr-1956, MRN [id number]  PCP:  Shirline Frees, MD  Cardiologist: Dr. Angelena Form  No chief complaint on file.     History of Present Illness: [name] is a 64 y.o. female who is here today for follow-up visit regarding  peripheral arterial disease.  She has known history of coronary artery disease, previous tobacco use, hyperlipidemia, diabetes, percutaneous lupus and peripheral arterial disease. She was seen last year for bilateral calf claudication which happened after walking half a mile. Noninvasive vascular studies showed an ABI of 0.88 on the right and 0.83 on the left.  Duplex showed moderate right SFA disease with two-vessel runoff below the knee.  On the left, there was borderline significant left SFA disease with three-vessel runoff below the knee. She has been doing well with no recent chest pain or shortness of breath.  Overall, she reports stable mild bilateral calf claudication which is not lifestyle limiting.  She is not walking as much as before due to the cold weather but recently got a stationary bike and wants to start doing that.   Past Medical History:  Diagnosis Date  . Anginal pain ([place])   . Anxiety   . Bence Jones proteinuria 02/29/2012   Random urine "M spike" 7.1% 02/13/12  . CAD (coronary artery disease)    Remote PCI to RCA in 2001, stent to the RCA in 2002, with last cath in 2010 with placement of two overlapping drug eluting  stents to the RCA  . 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 Depression   . Diabetes mellitus without complication ([place])    type 2 dx july 2018  . H/O: substance abuse ([place])   . Hyperlipidemia   . Mass on back   . Myocardial infarction [facility])    "not sure; nobody ever tells me" (04/23/2012)  . Noncompliance   . PVD (peripheral vascular disease) ([place])    MILD    Past Surgical History:  Procedure Laterality Date  . ABDOMINAL  HYSTERECTOMY  1999   ovaries removed weeks later  . ANTERIOR CRUCIATE LIGAMENT REPAIR  lasy February 02 2015   "have had 15 surgeries on my right ACL"  . BREAST SURGERY     "removed knot from one side"  . CORONARY ANGIOPLASTY WITH STENT PLACEMENT  2002; 2010; 04/23/2012   "1 + 2 + 1; total of 4"  . EXCISIONAL HEMORRHOIDECTOMY  yrs ago  . HERNIA REPAIR     points to stomach  . LEFT HEART CATHETERIZATION WITH CORONARY ANGIOGRAM N/A 04/23/2012   Procedure: LEFT HEART CATHETERIZATION WITH CORONARY ANGIOGRAM;  Surgeon: Burnell Blanks, MD;  Location: [facility] CATH LAB;  Service: Cardiovascular;  Laterality: N/A;  . MASS EXCISION N/A 08/29/2017   Procedure: EXCISIONAL BIOPSY OF BACK SOFT TISSUE MASS;  Surgeon: Greer Pickerel, MD;  Location: [facility] ORS;  Service: General;  Laterality: N/A;  . TONSILLECTOMY  early teens     Current Outpatient Medications  Medication Sig Dispense Refill  . acetaminophen (TYLENOL) 500 MG tablet Take 1,000 mg by mouth every 6 (six) hours as needed for mild pain. For pain     . amoxicillin (AMOXIL) 500 MG capsule TAKE 4 CAPSULES BY MOUTH 1 HOUR BEFORE TO DENTAL APPOINTMENT    . aspirin 81 MG tablet Take 81 mg by mouth at bedtime.     Marland Kitchen atorvastatin (LIPITOR) 20 MG tablet Take 1 tablet (20  mg total) by mouth daily at 6 PM. 90 tablet 3  . azelastine (ASTELIN) 0.1 % nasal spray Place 2 sprays into both nostrils 2 (two) times daily. 30 mL 5  . Cholecalciferol (HM VITAMIN D3) 100 MCG (4000 UT) CAPS Take by mouth daily.    Marland Kitchen ezetimibe (ZETIA) 10 MG tablet Take 1 tablet (10 mg total) by mouth daily. 90 tablet 3  . fluticasone (FLONASE) 50 MCG/ACT nasal spray as needed.     . hydroxychloroquine (PLAQUENIL) 200 MG tablet Take 200 mg by mouth at bedtime.     Marland Kitchen ibuprofen (ADVIL,MOTRIN) 200 MG tablet Take 400 mg by mouth daily as needed for moderate pain.    Marland Kitchen LORazepam (ATIVAN) 0.5 MG tablet     . metoprolol succinate (TOPROL-XL) 25 MG 24 hr tablet Take 1 tablet (25 mg total) by mouth  daily. 90 tablet 3  . nitroGLYCERIN (NITROSTAT) 0.4 MG SL tablet PLACE 1 TABLET (0.4 MG TOTAL) UNDER THE TONGUE EVERY 5 (FIVE) MINUTES AS NEEDED. FOR CHEST PAIN 25 tablet 3  . oxyCODONE (OXY IR/ROXICODONE) 5 MG immediate release tablet Take 1 tablet (5 mg total) by mouth every 6 (six) hours as needed for moderate pain, severe pain or breakthrough pain. 10 tablet 0  . sertraline (ZOLOFT) 50 MG tablet     . triamcinolone ointment (KENALOG) 0.1 %      No current facility-administered medications for this visit.    Allergies:   Patient has no known allergies.    Social History:  The patient  reports that she quit smoking about 7 years ago. Her smoking use included cigarettes. She has a 57.00 pack-year smoking history. She has never used smokeless tobacco. She reports current alcohol use of about 14.0 standard drinks of alcohol per week. She reports current drug use. Drug: Marijuana.   Family History:  The patient's family history includes Heart disease in her father; Heart failure in her father; Hypertension in her father and mother; Lung cancer in her mother; Stroke in her father.    ROS:  Please see the history of present illness.   Otherwise, review of systems are positive for none.   All other systems are reviewed and negative.    PHYSICAL EXAM: VS:  BP 128/80   Pulse 64   Temp (!) 97 F (36.1 C)   Ht 4\' 11"  (1.499 m)   Wt 135 lb (61.2 kg)   SpO2 98%   BMI 27.27 kg/m  , BMI Body mass index is 27.27 kg/m. GEN: Well nourished, well developed, in no acute distress  HEENT: normal  Neck: no JVD, carotid bruits, or masses Cardiac: RRR; no  rubs, or gallops,no edema .  1/ 6 systolic murmur in the aortic area Respiratory:  clear to auscultation bilaterally, normal work of breathing GI: soft, nontender, nondistended, + BS MS: no deformity or atrophy  Skin: warm and dry, no rash Neuro:  Strength and sensation are intact Psych: euthymic mood, full affect Vascular:  Distal pulses are not  palpable.   EKG:  EKG is ordered today. The ekg ordered today demonstrates normal sinus rhythm with nonspecific inferior ST changes   Recent Labs: 05/08/2019: ALT 26    Lipid Panel    Component Value Date/Time   CHOL 110 05/08/2019 0735   TRIG 65 05/08/2019 0735   HDL 44 05/08/2019 0735   CHOLHDL 2.5 05/08/2019 0735   CHOLHDL 3.6 06/04/2016 0811   VLDL 14 06/04/2016 0811   LDLCALC 52 05/08/2019 0735  Wt Readings from Last 3 Encounters:  08/25/19 135 lb (61.2 kg)  05/07/19 131 lb 1.9 oz (59.5 kg)  02/24/19 130 lb 3.2 oz (59.1 kg)       No flowsheet data found.    ASSESSMENT AND PLAN:  1.  Peripheral arterial disease: She continues have mild bilateral calf claudication which does not seem to be lifestyle limiting.  I encouraged her to continue with an exercise program and to notify me if her symptoms worsen.  2.  Coronary artery disease involving native coronary arteries without angina: She is doing well overall.  She reports no anginal symptoms  3.  Essential hypertension: Blood pressure is well controlled  4.  Hyperlipidemia: Continue treatment with atorvastatin and Zetia.  I reviewed most recent lipid profile done in October which showed an LDL of 52 and triglyceride of 65.  Both were on target.   Disposition:   FU with me in 12 months  Signed,  Kathlyn Sacramento, MD  08/25/2019 8:55 AM    [place]

## 2019-08-25 NOTE — Patient Instructions (Signed)
Medication Instructions:  Your physician recommends that you continue on your current medications as directed. Please refer to the Current Medication list given to you today. *If you need a refill on your cardiac medications before your next appointment, please call your pharmacy*  Lab Work: none If you have labs (blood work) drawn today and your tests are completely normal, you will receive your results only by: Marland Kitchen MyChart Message (if you have MyChart) OR . A paper copy in the mail If you have any lab test that is abnormal or we need to change your treatment, we will call you to review the results.  Testing/Procedures: none  Follow-Up: At [facility], you and your health needs are our priority.  As part of our continuing mission to provide you with exceptional heart care, we have created designated Provider Care Teams.  These Care Teams include your primary Cardiologist (physician) and Advanced Practice Providers (APPs -  Physician Assistants and Nurse Practitioners) who all work together to provide you with the care you need, when you need it.  Your next appointment:   12 month(s)  The format for your next appointment:   In Person  Provider:   Kathlyn Sacramento, MD

## 2019-09-07 DIAGNOSIS — I10 Essential (primary) hypertension: Secondary | ICD-10-CM | POA: Diagnosis not present

## 2019-09-07 DIAGNOSIS — E119 Type 2 diabetes mellitus without complications: Secondary | ICD-10-CM | POA: Diagnosis not present

## 2019-09-07 DIAGNOSIS — E782 Mixed hyperlipidemia: Secondary | ICD-10-CM | POA: Diagnosis not present

## 2019-09-07 DIAGNOSIS — E559 Vitamin D deficiency, unspecified: Secondary | ICD-10-CM | POA: Diagnosis not present

## 2019-10-01 DIAGNOSIS — E0590 Thyrotoxicosis, unspecified without thyrotoxic crisis or storm: Secondary | ICD-10-CM | POA: Diagnosis not present

## 2019-10-01 DIAGNOSIS — R899 Unspecified abnormal finding in specimens from other organs, systems and tissues: Secondary | ICD-10-CM | POA: Diagnosis not present

## 2019-10-08 DIAGNOSIS — H2513 Age-related nuclear cataract, bilateral: Secondary | ICD-10-CM | POA: Diagnosis not present

## 2019-10-08 DIAGNOSIS — Z79899 Other long term (current) drug therapy: Secondary | ICD-10-CM | POA: Diagnosis not present

## 2019-10-08 DIAGNOSIS — H04123 Dry eye syndrome of bilateral lacrimal glands: Secondary | ICD-10-CM | POA: Diagnosis not present

## 2019-10-08 DIAGNOSIS — M329 Systemic lupus erythematosus, unspecified: Secondary | ICD-10-CM | POA: Diagnosis not present

## 2019-10-25 DIAGNOSIS — R1032 Left lower quadrant pain: Secondary | ICD-10-CM | POA: Diagnosis not present

## 2019-10-25 DIAGNOSIS — K5900 Constipation, unspecified: Secondary | ICD-10-CM | POA: Diagnosis not present

## 2019-10-27 DIAGNOSIS — R1032 Left lower quadrant pain: Secondary | ICD-10-CM | POA: Diagnosis not present

## 2019-11-05 DIAGNOSIS — R109 Unspecified abdominal pain: Secondary | ICD-10-CM | POA: Diagnosis not present

## 2019-11-06 ENCOUNTER — Other Ambulatory Visit: Payer: Self-pay | Admitting: Family Medicine

## 2019-11-06 DIAGNOSIS — R109 Unspecified abdominal pain: Secondary | ICD-10-CM

## 2019-11-16 ENCOUNTER — Ambulatory Visit
Admission: RE | Admit: 2019-11-16 | Discharge: 2019-11-16 | Disposition: A | Discharge status: Home / Self Care | Payer: Federal, State, Local not specified - PPO | Source: Ambulatory Visit | Attending: Family Medicine | Admitting: Family Medicine

## 2019-11-16 DIAGNOSIS — R109 Unspecified abdominal pain: Secondary | ICD-10-CM | POA: Diagnosis not present

## 2019-11-16 MED ORDER — IOPAMIDOL (ISOVUE-300) INJECTION 61%
100.0000 mL | Freq: Once | INTRAVENOUS | Status: AC | PRN
  Administered 2019-11-16: 100 mL via INTRAVENOUS

## 2019-12-03 DIAGNOSIS — L930 Discoid lupus erythematosus: Secondary | ICD-10-CM | POA: Diagnosis not present

## 2019-12-10 DIAGNOSIS — E119 Type 2 diabetes mellitus without complications: Secondary | ICD-10-CM | POA: Diagnosis not present

## 2019-12-10 DIAGNOSIS — F419 Anxiety disorder, unspecified: Secondary | ICD-10-CM | POA: Diagnosis not present

## 2019-12-10 DIAGNOSIS — L930 Discoid lupus erythematosus: Secondary | ICD-10-CM | POA: Diagnosis not present

## 2019-12-10 DIAGNOSIS — I10 Essential (primary) hypertension: Secondary | ICD-10-CM | POA: Diagnosis not present

## 2019-12-10 DIAGNOSIS — E782 Mixed hyperlipidemia: Secondary | ICD-10-CM | POA: Diagnosis not present

## 2020-03-17 DIAGNOSIS — E119 Type 2 diabetes mellitus without complications: Secondary | ICD-10-CM | POA: Diagnosis not present

## 2020-04-30 ENCOUNTER — Ambulatory Visit: Payer: Federal, State, Local not specified - PPO | Attending: Internal Medicine

## 2020-04-30 ENCOUNTER — Other Ambulatory Visit: Payer: Self-pay

## 2020-04-30 DIAGNOSIS — Z23 Encounter for immunization: Secondary | ICD-10-CM

## 2020-04-30 NOTE — Progress Notes (Signed)
   Covid-19 Vaccination Clinic  Name:  [name]    MRN: [id number] DOB: Oct 06, 1955  04/30/2020  [name] was observed post Covid-19 immunization for 15 minutes without incident. She was provided with Vaccine Information Sheet and instruction to access the V-Safe system.   [name] was instructed to call 911 with any severe reactions post vaccine: Marland Kitchen Difficulty breathing  . Swelling of face and throat  . A fast heartbeat  . A bad rash all over body  . Dizziness and weakness

## 2020-05-30 ENCOUNTER — Other Ambulatory Visit: Payer: Self-pay

## 2020-05-30 ENCOUNTER — Encounter: Payer: Self-pay | Admitting: Cardiovascular Disease

## 2020-05-30 ENCOUNTER — Ambulatory Visit (INDEPENDENT_AMBULATORY_CARE_PROVIDER_SITE_OTHER): Payer: Federal, State, Local not specified - PPO | Admitting: Cardiovascular Disease

## 2020-05-30 VITALS — BP 128/74 | HR 88 | Ht 59.0 in | Wt 130.4 lb

## 2020-05-30 DIAGNOSIS — E7800 Pure hypercholesterolemia, unspecified: Secondary | ICD-10-CM

## 2020-05-30 DIAGNOSIS — I2510 Atherosclerotic heart disease of native coronary artery without angina pectoris: Secondary | ICD-10-CM | POA: Diagnosis not present

## 2020-05-30 DIAGNOSIS — I739 Peripheral vascular disease, unspecified: Secondary | ICD-10-CM

## 2020-05-30 LAB — HEPATIC FUNCTION PANEL
ALT: 15 IU/L (ref 0–32)
AST: 14 IU/L (ref 0–40)
Albumin: 4.4 g/dL (ref 3.8–4.8)
Alkaline Phosphatase: 77 IU/L (ref 44–121)
Bilirubin Total: 0.2 mg/dL (ref 0.0–1.2)
Bilirubin, Direct: <0.1 mg/dL (ref 0.00–0.40)
Total Protein: 6.4 g/dL (ref 6.0–8.5)

## 2020-05-30 LAB — LIPID PANEL
Chol/HDL Ratio: 2.7 ratio (ref 0.0–4.4)
Cholesterol, Total: 107 mg/dL (ref 100–199)
HDL: 39 mg/dL — ABNORMAL LOW (ref >39)
LDL Chol Calc (NIH): 54 mg/dL (ref 0–99)
Triglycerides: 66 mg/dL (ref 0–149)
VLDL Cholesterol Cal: 14 mg/dL (ref 5–40)

## 2020-05-30 NOTE — Progress Notes (Signed)
Chief Complaint  Patient presents with  . Follow-up    CAD   History of Present Illness: 64 yo female with history of CAD, former tobacco abuse, cutaneous lupus, anxiety, depression, HLD, PAD and DM here today for cardiac follow up. She has known CAD with prior PCI of the RCA followed by stenting of the RCA in 2002. Cath in 2010 showed that her RCA was subtotally occluded distally. She then had two overlapping drug eluting stents placed in the distal RCA. She has a total of 3 stents in the RCA. Stress test in 2013 in the setting of chest pain showed possible lateral wall ischemia. Cath October 2013 with severe stenosis in the obtuse marginal treated with a bare metal stent. Stress test August 2016 without ischemia. Echo August 2016 with normal LV systolic function. Her PAD is followed by in our [place] clinic by Dr. Fletcher Anon.   She is here today for follow up. The patient denies any chest pain, dyspnea, palpitations, lower extremity edema, orthopnea, PND, dizziness, near syncope or syncope.     Primary Care Physician: Shirline Frees, MD  Past Medical History:  Diagnosis Date  . Anginal pain ([place])   . Anxiety   . Bence Jones proteinuria 02/29/2012   Random urine "M spike" 7.1% 02/13/12  . CAD (coronary artery disease)    Remote PCI to RCA in 2001, stent to the RCA in 2002, with last cath in 2010 with placement of two overlapping drug eluting  stents to the RCA  . 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 Depression   . Diabetes mellitus without complication ([place])    type 2 dx july 2018  . H/O: substance abuse ([place])   . Hyperlipidemia   . Mass on back   . Myocardial infarction [facility])    "not sure; nobody ever tells me" (04/23/2012)  . Noncompliance   . PVD (peripheral vascular disease) ([place])    MILD    Past Surgical History:  Procedure Laterality Date  . ABDOMINAL HYSTERECTOMY  1999   ovaries removed weeks later  . ANTERIOR CRUCIATE LIGAMENT REPAIR  lasy February 02 2015   "have  had 15 surgeries on my right ACL"  . BREAST SURGERY     "removed knot from one side"  . CORONARY ANGIOPLASTY WITH STENT PLACEMENT  2002; 2010; 04/23/2012   "1 + 2 + 1; total of 4"  . EXCISIONAL HEMORRHOIDECTOMY  yrs ago  . HERNIA REPAIR     points to stomach  . LEFT HEART CATHETERIZATION WITH CORONARY ANGIOGRAM N/A 04/23/2012   Procedure: LEFT HEART CATHETERIZATION WITH CORONARY ANGIOGRAM;  Surgeon: Burnell Blanks, MD;  Location: [facility] CATH LAB;  Service: Cardiovascular;  Laterality: N/A;  . MASS EXCISION N/A 08/29/2017   Procedure: EXCISIONAL BIOPSY OF BACK SOFT TISSUE MASS;  Surgeon: Greer Pickerel, MD;  Location: [facility] ORS;  Service: General;  Laterality: N/A;  . TONSILLECTOMY  early teens    Current Outpatient Medications  Medication Sig Dispense Refill  . acetaminophen (TYLENOL) 500 MG tablet Take 1,000 mg by mouth every 6 (six) hours as needed for mild pain. For pain     . amoxicillin (AMOXIL) 500 MG capsule TAKE 4 CAPSULES BY MOUTH 1 HOUR BEFORE TO DENTAL APPOINTMENT    . aspirin 81 MG tablet Take 81 mg by mouth at bedtime.     Marland Kitchen atorvastatin (LIPITOR) 20 MG tablet Take 1 tablet (20 mg total) by mouth daily at 6 PM. 90 tablet 3  .  azelastine (ASTELIN) 0.1 % nasal spray Place 2 sprays into both nostrils 2 (two) times daily. 30 mL 5  . Cholecalciferol (HM VITAMIN D3) 100 MCG (4000 UT) CAPS Take by mouth daily.    Marland Kitchen ezetimibe (ZETIA) 10 MG tablet Take 1 tablet (10 mg total) by mouth daily. 90 tablet 3  . hydroxychloroquine (PLAQUENIL) 200 MG tablet Take 200 mg by mouth at bedtime.     Marland Kitchen ibuprofen (ADVIL,MOTRIN) 200 MG tablet Take 400 mg by mouth daily as needed for moderate pain.    Marland Kitchen LORazepam (ATIVAN) 0.5 MG tablet as needed.     . metFORMIN (GLUCOPHAGE) 500 MG tablet Take 1,000 mg by mouth daily with breakfast.    . metoprolol succinate (TOPROL-XL) 25 MG 24 hr tablet Take 1 tablet (25 mg total) by mouth daily. 90 tablet 3  . nitroGLYCERIN (NITROSTAT) 0.4 MG SL tablet PLACE 1 TABLET  (0.4 MG TOTAL) UNDER THE TONGUE EVERY 5 (FIVE) MINUTES AS NEEDED. FOR CHEST PAIN 25 tablet 3  . sertraline (ZOLOFT) 50 MG tablet as needed.     . triamcinolone ointment (KENALOG) 0.1 %      No current facility-administered medications for this visit.    No Known Allergies  Social History   Socioeconomic History  . Marital status: Single    Spouse name: Not on file  . Number of children: Not on file  . Years of education: Not on file  . Highest education level: Not on file  Occupational History  . Not on file  Tobacco Use  . Smoking status: Former Smoker    Packs/day: 1.50    Years: 38.00    Pack years: 57.00    Types: Cigarettes    Quit date: 04/20/2012    Years since quitting: 8.1  . Smokeless tobacco: Never Used  Vaping Use  . Vaping Use: Never used  Substance and Sexual Activity  . Alcohol use: Yes    Alcohol/week: 14.0 standard drinks    Types: 14 Cans of beer per week    Comment: beer 2 x week   . Drug use: Yes    Types: Marijuana    Comment: smoked marijuana 09-04-16  . Sexual activity: Never  Other Topics Concern  . Not on file  Social History Narrative  . Not on file   Social Determinants of Health   Financial Resource Strain:   . Difficulty of Paying Living Expenses: Not on file  Food Insecurity:   . Worried About Charity fundraiser in the Last Year: Not on file  . Ran Out of Food in the Last Year: Not on file  Transportation Needs:   . Lack of Transportation (Medical): Not on file  . Lack of Transportation (Non-Medical): Not on file  Physical Activity:   . Days of Exercise per Week: Not on file  . Minutes of Exercise per Session: Not on file  Stress:   . Feeling of Stress : Not on file  Social Connections:   . Frequency of Communication with Friends and Family: Not on file  . Frequency of Social Gatherings with Friends and Family: Not on file  . Attends Religious Services: Not on file  . Active Member of Clubs or Organizations: Not on file  .  Attends Archivist Meetings: Not on file  . Marital Status: Not on file  Intimate Partner Violence:   . Fear of Current or Ex-Partner: Not on file  . Emotionally Abused: Not on file  . Physically Abused:  Not on file  . Sexually Abused: Not on file    Family History  Problem Relation Age of Onset  . Hypertension Mother   . Lung cancer Mother   . Heart disease Father   . Hypertension Father   . Stroke Father   . Heart failure Father     Review of Systems:  As stated in the HPI and otherwise negative.   BP 128/74   Pulse 88   Ht 4\' 11"  (1.499 m)   Wt 130 lb 6.4 oz (59.1 kg)   SpO2 96%   BMI 26.34 kg/m   Physical Examination:  General: Well developed, well nourished, NAD  HEENT: OP clear, mucus membranes moist  SKIN: warm, dry. No rashes. Neuro: No focal deficits  Musculoskeletal: Muscle strength 5/5 all ext  Psychiatric: Mood and affect normal  Neck: No JVD, no carotid bruits, no thyromegaly, no lymphadenopathy.  Lungs:Clear bilaterally, no wheezes, rhonci, crackles Cardiovascular: Regular rate and rhythm. No murmurs, gallops or rubs. Abdomen:Soft. Bowel sounds present. Non-tender.  Extremities: No lower extremity edema.  Echo August 2016: Left ventricle: The cavity size was normal. Systolic function was   normal. The estimated ejection fraction was in the range of 60%   to 65%. Wall motion was normal; there were no regional wall   motion abnormalities. Left ventricular diastolic function   parameters were normal.  Cardiac cath 04/23/12:  Left main: No obstructive disease.  Left Anterior Descending Artery: Large caliber vessel that courses to the apex. There is a 20% stenosis proximally. The diagonal branch is patent.  Intermediate branch: Moderate sized. No disease noted.  Circumflex Artery: Moderate caliber vessel with a long area of 50% stenosis proximally followed by a 99% stenosis leading into the large OM branch. The continuation of the AV groove  Circumflex is occluded beyond the marginal branch and fills from right to left collaterals, unchanged from last cath.  Right Coronary Artery: Large, dominant vessel with diffuse 30% stenosis in the mid vessel with patent overlapping stents in the distal vessel. There is minimal in-stent restenosis. The distal AV groove Circumflex is seen to fill from right to left collaterals.  Left Ventricular Angiogram: LVEF=50%  Impression:  1. Double vessel CAD with patent stents RCA and severe stenosis in the mid Circumflex extending into the first OM branch.  2. Low normal LV systolic function  3. Unstable angina  4. Successful PTCA/bare metal stent x 1.  EKG:  EKG is not ordered today. The ekg ordered today demonstrates   EKG from February 2021 with sinus and reviewed today  Recent Labs: No results found for requested labs within last 8760 hours.   Lipid Panel    Component Value Date/Time   CHOL 110 05/08/2019 0735   TRIG 65 05/08/2019 0735   HDL 44 05/08/2019 0735   CHOLHDL 2.5 05/08/2019 0735   CHOLHDL 3.6 06/04/2016 0811   VLDL 14 06/04/2016 0811   LDLCALC 52 05/08/2019 0735     Wt Readings from Last 3 Encounters:  05/30/20 130 lb 6.4 oz (59.1 kg)  08/25/19 135 lb (61.2 kg)  05/07/19 131 lb 1.9 oz (59.5 kg)     Other studies Reviewed: Additional studies/ records that were reviewed today include: Review of the above records demonstrates:   Assessment and Plan:   1. CAD without angina: No chest pain. Continue ASA, statin and beta blocker.  Will arrange an echo to assess LVEF.   2. Tobacco abuse, in remission: She no longer smokes  3.  Hyperlipidemia: LDL at goal in October 2020. Continue statin and Zetia. Repeat lipids and LFTs now.   4. PAD: Followed in the [facility] clinic by Dr. Fletcher Anon.   Current medicines are reviewed at length with the patient today.  The patient does not have concerns regarding medicines.  The following changes have been made:  no change  Labs/ tests ordered  today include:   Orders Placed This Encounter  Procedures  . Hepatic function panel  . Lipid panel  . ECHOCARDIOGRAM COMPLETE    Disposition:   FU with me in 12 months  Signed, Lauree Chandler, MD 05/30/2020 8:57 AM    [facility] [address], [place], [place]  [address] Phone: [phone]; Fax: [phone]

## 2020-05-30 NOTE — Patient Instructions (Signed)
Medication Instructions:  Your physician recommends that you continue on your current medications as directed. Please refer to the Current Medication list given to you today.  *If you need a refill on your cardiac medications before your next appointment, please call your pharmacy*   Lab Work: Lipid and Liver today  If you have labs (blood work) drawn today and your tests are completely normal, you will receive your results only by: Marland Kitchen MyChart Message (if you have MyChart) OR . A paper copy in the mail If you have any lab test that is abnormal or we need to change your treatment, we will call you to review the results.   Testing/Procedures: Your physician has requested that you have an echocardiogram. Echocardiography is a painless test that uses sound waves to create images of your heart. It provides your doctor with information about the size and shape of your heart and how well your heart's chambers and valves are working. This procedure takes approximately one hour. There are no restrictions for this procedure.   Follow-Up: At [facility], you and your health needs are our priority.  As part of our continuing mission to provide you with exceptional heart care, we have created designated Provider Care Teams.  These Care Teams include your primary Cardiologist (physician) and Advanced Practice Providers (APPs -  Physician Assistants and Nurse Practitioners) who all work together to provide you with the care you need, when you need it.  We recommend signing up for the patient portal called "MyChart".  Sign up information is provided on this After Visit Summary.  MyChart is used to connect with patients for Virtual Visits (Telemedicine).  Patients are able to view lab/test results, encounter notes, upcoming appointments, etc.  Non-urgent messages can be sent to your provider as well.   To learn more about what you can do with MyChart, go to [email or web address].    Your next  appointment:   12 month(s)  The format for your next appointment:   In Person  Provider:   You may see Lauree Chandler, MD or one of the following Advanced Practice Providers on your designated Care Team:    Melina Copa, PA-C  Ermalinda Barrios, PA-C    Other Instructions

## 2020-06-06 DIAGNOSIS — L930 Discoid lupus erythematosus: Secondary | ICD-10-CM | POA: Diagnosis not present

## 2020-06-16 DIAGNOSIS — E782 Mixed hyperlipidemia: Secondary | ICD-10-CM | POA: Diagnosis not present

## 2020-06-16 DIAGNOSIS — I10 Essential (primary) hypertension: Secondary | ICD-10-CM | POA: Diagnosis not present

## 2020-06-16 DIAGNOSIS — L930 Discoid lupus erythematosus: Secondary | ICD-10-CM | POA: Diagnosis not present

## 2020-06-16 DIAGNOSIS — E559 Vitamin D deficiency, unspecified: Secondary | ICD-10-CM | POA: Diagnosis not present

## 2020-06-16 DIAGNOSIS — E119 Type 2 diabetes mellitus without complications: Secondary | ICD-10-CM | POA: Diagnosis not present

## 2020-06-20 ENCOUNTER — Other Ambulatory Visit: Payer: Self-pay | Admitting: Cardiovascular Disease

## 2020-06-20 DIAGNOSIS — I2510 Atherosclerotic heart disease of native coronary artery without angina pectoris: Secondary | ICD-10-CM

## 2020-06-20 DIAGNOSIS — I10 Essential (primary) hypertension: Secondary | ICD-10-CM

## 2020-06-21 ENCOUNTER — Other Ambulatory Visit: Payer: Self-pay

## 2020-06-21 ENCOUNTER — Ambulatory Visit (HOSPITAL_COMMUNITY): Payer: Federal, State, Local not specified - PPO | Attending: Internal Medicine

## 2020-06-21 DIAGNOSIS — I2510 Atherosclerotic heart disease of native coronary artery without angina pectoris: Secondary | ICD-10-CM | POA: Diagnosis not present

## 2020-06-21 LAB — ECHOCARDIOGRAM COMPLETE
Area-P 1/2: 3.99 cm2
S' Lateral: 2.8 cm

## 2020-07-29 ENCOUNTER — Other Ambulatory Visit: Payer: Self-pay

## 2020-07-29 ENCOUNTER — Encounter (HOSPITAL_COMMUNITY): Payer: Self-pay

## 2020-07-29 ENCOUNTER — Emergency Department (HOSPITAL_COMMUNITY)
Admission: EM | Admit: 2020-07-29 | Discharge: 2020-07-29 | Disposition: A | Discharge status: Home / Self Care | Payer: Federal, State, Local not specified - PPO | Attending: Emergency Medicine | Admitting: Emergency Medicine

## 2020-07-29 ENCOUNTER — Telehealth: Payer: Self-pay | Admitting: Cardiovascular Disease

## 2020-07-29 DIAGNOSIS — Z87891 Personal history of nicotine dependence: Secondary | ICD-10-CM | POA: Diagnosis not present

## 2020-07-29 DIAGNOSIS — Z7984 Long term (current) use of oral hypoglycemic drugs: Secondary | ICD-10-CM | POA: Insufficient documentation

## 2020-07-29 DIAGNOSIS — R079 Chest pain, unspecified: Secondary | ICD-10-CM | POA: Diagnosis not present

## 2020-07-29 DIAGNOSIS — E119 Type 2 diabetes mellitus without complications: Secondary | ICD-10-CM | POA: Insufficient documentation

## 2020-07-29 DIAGNOSIS — Z79899 Other long term (current) drug therapy: Secondary | ICD-10-CM | POA: Insufficient documentation

## 2020-07-29 DIAGNOSIS — Z7982 Long term (current) use of aspirin: Secondary | ICD-10-CM | POA: Insufficient documentation

## 2020-07-29 DIAGNOSIS — I10 Essential (primary) hypertension: Secondary | ICD-10-CM | POA: Diagnosis not present

## 2020-07-29 DIAGNOSIS — I2510 Atherosclerotic heart disease of native coronary artery without angina pectoris: Secondary | ICD-10-CM | POA: Diagnosis not present

## 2020-07-29 DIAGNOSIS — R0789 Other chest pain: Secondary | ICD-10-CM | POA: Diagnosis not present

## 2020-07-29 LAB — TROPONIN I (HIGH SENSITIVITY)
Troponin I (High Sensitivity): 3 ng/L (ref <18)
Troponin I (High Sensitivity): 3 ng/L (ref <18)

## 2020-07-29 LAB — CBC
HCT: 34.8 % — ABNORMAL LOW (ref 36.0–46.0)
Hemoglobin: 11.3 g/dL — ABNORMAL LOW (ref 12.0–15.0)
MCH: 29 pg (ref 26.0–34.0)
MCHC: 32.5 g/dL (ref 30.0–36.0)
MCV: 89.5 fL (ref 80.0–100.0)
Platelets: 324 10*3/uL (ref 150–400)
RBC: 3.89 MIL/uL (ref 3.87–5.11)
RDW: 14.7 % (ref 11.5–15.5)
WBC: 4.2 10*3/uL (ref 4.0–10.5)
nRBC: 0 % (ref 0.0–0.2)

## 2020-07-29 LAB — BASIC METABOLIC PANEL
Anion gap: 9 (ref 5–15)
BUN: 13 mg/dL (ref 8–23)
CO2: 27 mmol/L (ref 22–32)
Calcium: 9.4 mg/dL (ref 8.9–10.3)
Chloride: 105 mmol/L (ref 98–111)
Creatinine, Ser: 0.76 mg/dL (ref 0.44–1.00)
GFR, Estimated: >60 mL/min (ref >60)
Glucose, Bld: 175 mg/dL — ABNORMAL HIGH (ref 70–99)
Potassium: 3.9 mmol/L (ref 3.5–5.1)
Sodium: 141 mmol/L (ref 135–145)

## 2020-07-29 MED ORDER — ASPIRIN 81 MG PO CHEW: 324.0000 mg | CHEWABLE_TABLET | Freq: Once | ORAL | Status: DC

## 2020-07-29 MED ORDER — PANTOPRAZOLE SODIUM 20 MG PO TBEC: 20.0000 mg | DELAYED_RELEASE_TABLET | Freq: Every day | ORAL | 1 refills | Status: DC

## 2020-07-29 NOTE — Telephone Encounter (Signed)
Thanks

## 2020-07-29 NOTE — Telephone Encounter (Signed)
Pt c/o of Chest Pain: STAT if CP now or developed within 24 hours  1. Are you having CP right now? Sharp pain under her left breat- she is in our parking lot  2. Are you experiencing any other symptoms (ex. SOB, nausea, vomiting, sweating)?  A little sweating  3. How long have you been experiencing CP? yesterday  4. Is your CP continuous or coming and going?  staying  5. Have you taken Nitroglycerin? no ?   Pt is outdoors in the Parking Lot

## 2020-07-29 NOTE — ED Provider Notes (Signed)
[name] Provider Note   CSN: [id number] Arrival date & time: 07/29/20  1319     History Chief Complaint  Patient presents with  . Chest Pain    [name] is a 65 y.o. female.  HPI   This patient is a 65 year old female with a known history of coronary disease with approximately 4 stents in her heart.  She also has a history of diabetes, hyperlipidemia and peripheral vascular disease.  She had actually called the cardiologist because she was having chest pain, was advised to come to the hospital due to her significant history.  She reports that yesterday she developed left-sided chest pain which was under her left breast and around the left side associated with some left arm discomfort as well.  She was diaphoretic but had no nausea vomiting or shortness of breath.  She states that it is fluctuating in intensity but there is a small underlying discomfort in the left chest.  She has taken her aspirin, she is not on on the any other anticoagulants.  She denies any coughing fevers or swelling of the legs, she denies any nausea vomiting or diarrhea, she does feel like she has had some abdominal bloating and gas that started within the last hour.  Past Medical History:  Diagnosis Date  . Anginal pain ([place])   . Anxiety   . Bence Jones proteinuria 02/29/2012   Random urine "M spike" 7.1% 02/13/12  . CAD (coronary artery disease)    Remote PCI to RCA in 2001, stent to the RCA in 2002, with last cath in 2010 with placement of two overlapping drug eluting  stents to the RCA  . 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 Depression   . Diabetes mellitus without complication ([place])    type 2 dx july 2018  . H/O: substance abuse ([place])   . Hyperlipidemia   . Mass on back   . Myocardial infarction [facility])    "not sure; nobody ever tells me" (04/23/2012)  . Noncompliance   . PVD (peripheral vascular disease) [facility])    MILD    Patient Active  Problem List   Diagnosis Date Noted  . Hypokalemia 04/24/2012  . Unstable angina ([place]) 04/24/2012  . Hyperglycemia 04/24/2012  . Chest pain 04/23/2012  . Intermediate coronary syndrome ([place]) 04/23/2012  . Discoid lupus erythematosus 04/09/2012  . Single skin nodule 04/09/2012  . CAD (coronary artery disease) 05/29/2011  . HTN (hypertension) 12/04/2010  . Tobacco abuse 12/04/2010  . Bruising   . IHD (ischemic heart disease)   . Hyperlipidemia   . Cutaneous lupus erythematosus   . Anxiety     Past Surgical History:  Procedure Laterality Date  . ABDOMINAL HYSTERECTOMY  1999   ovaries removed weeks later  . ANTERIOR CRUCIATE LIGAMENT REPAIR  lasy February 02 2015   "have had 15 surgeries on my right ACL"  . BREAST SURGERY     "removed knot from one side"  . CORONARY ANGIOPLASTY WITH STENT PLACEMENT  2002; 2010; 04/23/2012   "1 + 2 + 1; total of 4"  . EXCISIONAL HEMORRHOIDECTOMY  yrs ago  . HERNIA REPAIR     points to stomach  . LEFT HEART CATHETERIZATION WITH CORONARY ANGIOGRAM N/A 04/23/2012   Procedure: LEFT HEART CATHETERIZATION WITH CORONARY ANGIOGRAM;  Surgeon: Burnell Blanks, MD;  Location: [facility] CATH LAB;  Service: Cardiovascular;  Laterality: N/A;  . MASS EXCISION N/A 08/29/2017   Procedure: EXCISIONAL BIOPSY OF  BACK SOFT TISSUE MASS;  Surgeon: Greer Pickerel, MD;  Location: [facility] ORS;  Service: General;  Laterality: N/A;  . TONSILLECTOMY  early teens     OB History   No obstetric history on file.     Family History  Problem Relation Age of Onset  . Hypertension Mother   . Lung cancer Mother   . Heart disease Father   . Hypertension Father   . Stroke Father   . Heart failure Father     Social History   Tobacco Use  . Smoking status: Former Smoker    Packs/day: 1.50    Years: 38.00    Pack years: 57.00    Types: Cigarettes    Quit date: 04/20/2012    Years since quitting: 8.2  . Smokeless tobacco: Never Used  Vaping Use  . Vaping Use: Never used   Substance Use Topics  . Alcohol use: Yes    Alcohol/week: 14.0 standard drinks    Types: 14 Cans of beer per week    Comment: beer 2 x week   . Drug use: Yes    Types: Marijuana    Comment: smoked marijuana 09-04-16    Home Medications Prior to Admission medications   Medication Sig Start Date End Date Taking? Authorizing Provider  acetaminophen (TYLENOL) 500 MG tablet Take 1,000 mg by mouth every 6 (six) hours as needed for mild pain. For pain     [provider]  amoxicillin (AMOXIL) 500 MG capsule TAKE 4 CAPSULES BY MOUTH 1 HOUR BEFORE TO DENTAL APPOINTMENT 12/30/18   [provider]  aspirin 81 MG tablet Take 81 mg by mouth at bedtime.     [provider]  atorvastatin (LIPITOR) 20 MG tablet TAKE 1 TABLET BY MOUTH EVERY DAY 06/21/20   Burnell Blanks, MD  azelastine (ASTELIN) 0.1 % nasal spray Place 2 sprays into both nostrils 2 (two) times daily. 08/13/18   Kennith Gain, MD  Cholecalciferol (HM VITAMIN D3) 100 MCG (4000 UT) CAPS Take by mouth daily.    [provider]  ezetimibe (ZETIA) 10 MG tablet Take 1 tablet (10 mg total) by mouth daily. 05/07/19   Burnell Blanks, MD  hydroxychloroquine (PLAQUENIL) 200 MG tablet Take 200 mg by mouth at bedtime.     [provider]  ibuprofen (ADVIL,MOTRIN) 200 MG tablet Take 400 mg by mouth daily as needed for moderate pain.    [provider]  LORazepam (ATIVAN) 0.5 MG tablet as needed.  06/26/18   [provider]  metFORMIN (GLUCOPHAGE) 500 MG tablet Take 1,000 mg by mouth daily with breakfast.    [provider]  metoprolol succinate (TOPROL-XL) 25 MG 24 hr tablet TAKE 1 TABLET BY MOUTH EVERY DAY 06/21/20   Burnell Blanks, MD  nitroGLYCERIN (NITROSTAT) 0.4 MG SL tablet PLACE 1 TABLET (0.4 MG TOTAL) UNDER THE TONGUE EVERY 5 (FIVE) MINUTES AS NEEDED. FOR CHEST PAIN 05/07/19   Burnell Blanks, MD  sertraline (ZOLOFT) 50 MG tablet as  needed.  06/26/18   [provider]  triamcinolone ointment (KENALOG) 0.1 %  06/24/18   [provider]    Allergies    Patient has no known allergies.  Review of Systems   Review of Systems  All other systems reviewed and are negative.   Physical Exam Updated Vital Signs BP 102/64 (BP Location: Left Arm)   Pulse 71   Temp 98.1 F (36.7 C) (Oral)   Resp 16   SpO2  98%   Physical Exam Vitals and nursing note reviewed.  Constitutional:      General: She is not in acute distress.    Appearance: She is well-developed and well-nourished.  HENT:     Head: Normocephalic and atraumatic.     Mouth/Throat:     Mouth: Oropharynx is clear and moist.     Pharynx: No oropharyngeal exudate.  Eyes:     General: No scleral icterus.       Right eye: No discharge.        Left eye: No discharge.     Extraocular Movements: EOM normal.     Conjunctiva/sclera: Conjunctivae normal.     Pupils: Pupils are equal, round, and reactive to light.  Neck:     Thyroid: No thyromegaly.     Vascular: No JVD.  Cardiovascular:     Rate and Rhythm: Normal rate and regular rhythm.     Pulses: Intact distal pulses.     Heart sounds: Normal heart sounds. No murmur heard. No friction rub. No gallop.   Pulmonary:     Effort: Pulmonary effort is normal. No respiratory distress.     Breath sounds: Normal breath sounds. No wheezing or rales.  Abdominal:     General: Bowel sounds are normal. There is no distension.     Palpations: Abdomen is soft. There is no mass.     Tenderness: There is no abdominal tenderness.     Comments: Abdomen is slightly distended and tympanitic with increased bowel sounds, nontender  Musculoskeletal:        General: No tenderness or edema. Normal range of motion.     Cervical back: Normal range of motion and neck supple.  Lymphadenopathy:     Cervical: No cervical adenopathy.  Skin:    General: Skin is warm and dry.     Findings: No erythema or rash.   Neurological:     Mental Status: She is alert.     Coordination: Coordination normal.     Comments: Normal speech sensation and gait  Psychiatric:        Mood and Affect: Mood and affect normal.        Behavior: Behavior normal.     ED Results / Procedures / Treatments   Labs (all labs ordered are listed, but only abnormal results are displayed) Labs Reviewed  BASIC METABOLIC PANEL - Abnormal; Notable for the following components:      Result Value   Glucose, Bld 175 (*)    All other components within normal limits  CBC - Abnormal; Notable for the following components:   Hemoglobin 11.3 (*)    HCT 34.8 (*)    All other components within normal limits  TROPONIN I (HIGH SENSITIVITY)  TROPONIN I (HIGH SENSITIVITY)    EKG EKG Interpretation  Date/Time:  Friday July 29 2020 13:26:03 EST Ventricular Rate:  96 PR Interval:  142 QRS Duration: 74 QT Interval:  346 QTC Calculation: 437 R Axis:   4 Text Interpretation: Normal sinus rhythm ST & T wave abnormality, consider inferior ischemia Abnormal ECG since last tracing no significant change Confirmed by Noemi Chapel [phone]) on 07/29/2020 4:11:55 PM   Radiology No results found.  Procedures Procedures (including critical care time)  Medications Ordered in ED Medications  aspirin chewable tablet 324 mg (has no administration in time range)    ED Course  I have reviewed the triage vital signs and the nursing notes.  Pertinent labs & imaging results that were available  during my care of the patient were reviewed by me and considered in my medical decision making (see chart for details).  Clinical Course as of 07/31/20 1523  Fri Jul 29, 2020  1711 I discussed the case with Dr. Acie Fredrickson of the cardiology service who agrees that with a negative troponin in 24 hours of symptoms along with an EKG that does not show any acute ischemia the patient can likely be discharged if her second troponin is also negative.  The patient  states that this pain is distinctly different than her prior pain, she is now having some gas distention but no tenderness on my exam.  I do not think this is related to her gallbladder or pancreatitis, we can maximize antacid therapy and have her follow-up, the patient is agreeable, cardiology is on board as well [BM]    Clinical Course User Index [BM] Noemi Chapel, MD   MDM Rules/Calculators/A&P                          This patient's exam is rather unremarkable, thankfully her labs are also unremarkable with no signs of elevation in troponin.  The EKG is slightly abnormal with ST and T wave abnormalities however it does not seem to be significantly changed from prior.  Her symptoms are fluctuating, I gave her aspirin, I will consult with cardiology.  I have personally reviewed the medical record, she last saw the cardiology office in November 2021 during which time it was reviewed that she had prior coronary disease with stenting of the right coronary artery as well as stenting in the obtuse marginal as well.  This was early in the 2000's, stress test in 2016 without ischemia.  Echo in August 2016 with normal left ventricular systolic function.  Final Clinical Impression(s) / ED Diagnoses Final diagnoses:  Chest pain, unspecified type      Noemi Chapel, MD 07/31/20 1523

## 2020-07-29 NOTE — Telephone Encounter (Signed)
Patient states that she is currently in our parking lot because she has been experiencing chest pain on and off since yesterday. Patient states that pain starts under right breat and radiates to her left arm. She denies SOB, N/V but reports diaphoresis. The pain is more consistent today than yesterday. No vital signs available. Advised patient to go to ED to be evaluated due to cardiac history and concern for serious cardiac event. Patient verbalized understanding.

## 2020-07-29 NOTE — ED Triage Notes (Signed)
Pt c/o chest pain under her left breast x1 day. Pt unsure if it's heartburn, no other associated symptoms. Pt states her symptoms started after twisting right foot, states "I may have some blockages going on" cardiologist sent her here for further evaluation

## 2020-07-29 NOTE — Discharge Instructions (Signed)
Your testing came back normal, there is no signs of heart attack, this may be related to the gas that you are having and some acid reflux.  Please take pantoprazole once a day for the next 30 days and follow-up with your family doctor.  I have spoken with the cardiologist who is on-call and they have recommended close follow-up as well.  Thankfully you do not need to be admitted to the hospital today however if you do develop increasing pain shortness of breath or any other worsening symptoms return to the ER immediately

## 2020-09-19 DIAGNOSIS — E119 Type 2 diabetes mellitus without complications: Secondary | ICD-10-CM | POA: Diagnosis not present

## 2020-09-21 DIAGNOSIS — R1013 Epigastric pain: Secondary | ICD-10-CM | POA: Diagnosis not present

## 2020-09-21 DIAGNOSIS — K219 Gastro-esophageal reflux disease without esophagitis: Secondary | ICD-10-CM | POA: Diagnosis not present

## 2020-09-23 ENCOUNTER — Other Ambulatory Visit: Payer: Self-pay | Admitting: Cardiovascular Disease

## 2020-09-27 ENCOUNTER — Ambulatory Visit: Payer: Federal, State, Local not specified - PPO | Admitting: Cardiovascular Disease

## 2020-10-18 ENCOUNTER — Ambulatory Visit: Payer: Federal, State, Local not specified - PPO | Admitting: Cardiovascular Disease

## 2020-10-18 ENCOUNTER — Encounter: Payer: Self-pay | Admitting: Cardiovascular Disease

## 2020-10-18 ENCOUNTER — Other Ambulatory Visit: Payer: Self-pay

## 2020-10-18 VITALS — BP 136/90 | HR 66 | Ht 59.0 in | Wt 130.8 lb

## 2020-10-18 DIAGNOSIS — I10 Essential (primary) hypertension: Secondary | ICD-10-CM | POA: Diagnosis not present

## 2020-10-18 DIAGNOSIS — R079 Chest pain, unspecified: Secondary | ICD-10-CM

## 2020-10-18 DIAGNOSIS — I739 Peripheral vascular disease, unspecified: Secondary | ICD-10-CM

## 2020-10-18 DIAGNOSIS — E785 Hyperlipidemia, unspecified: Secondary | ICD-10-CM | POA: Diagnosis not present

## 2020-10-18 DIAGNOSIS — I25118 Atherosclerotic heart disease of native coronary artery with other forms of angina pectoris: Secondary | ICD-10-CM | POA: Diagnosis not present

## 2020-10-18 NOTE — Progress Notes (Signed)
Cardiology Office Note   Date:  10/18/2020   ID:  [name], DOB [birth year], MRN [id number]  PCP:  Shirline Frees, MD  Cardiologist: Dr. Angelena Form  No chief complaint on file.     History of Present Illness: [name] is a 65 y.o. female who is here today for follow-up visit regarding  peripheral arterial disease.  She has known history of coronary artery disease, previous tobacco use, hyperlipidemia, diabetes, percutaneous lupus and peripheral arterial disease. She was seen in 2020 for bilateral calf claudication which happened after walking half a mile. Noninvasive vascular studies showed an ABI of 0.88 on the right and 0.83 on the left.  Duplex showed moderate right SFA disease with two-vessel runoff below the knee.  On the left, there was borderline significant left SFA disease with three-vessel runoff below the knee. Given that her symptoms were not lifestyle limiting, I recommended a walking program and treatment of risk factors.  She reports stable claudication overall which happens after walking half a mile.  No rest pain. She went to the emergency room in January for chest pain that was thought to be due to GERD.  She was given Protonix with partial improvement in symptoms.  However, she reports continued intermittent chest pain described as fullness and tightness that is not necessarily related to activities and can happen anytime.  She is concerned that it might be cardiac. No cigarette smoking but she does use marijuana on a regular basis.   Past Medical History:  Diagnosis Date  . Anginal pain ([place])   . Anxiety   . Bence Jones proteinuria 02/29/2012   Random urine "M spike" 7.1% 02/13/12  . CAD (coronary artery disease)    Remote PCI to RCA in 2001, stent to the RCA in 2002, with last cath in 2010 with placement of two overlapping drug eluting  stents to the RCA  . 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 Depression   . Diabetes mellitus without  complication ([place])    type 2 dx july 2018  . H/O: substance abuse ([place])   . Hyperlipidemia   . Mass on back   . Myocardial infarction [facility])    "not sure; nobody ever tells me" (04/23/2012)  . Noncompliance   . PVD (peripheral vascular disease) ([place])    MILD    Past Surgical History:  Procedure Laterality Date  . ABDOMINAL HYSTERECTOMY  1999   ovaries removed weeks later  . ANTERIOR CRUCIATE LIGAMENT REPAIR  lasy February 02 2015   "have had 15 surgeries on my right ACL"  . BREAST SURGERY     "removed knot from one side"  . CORONARY ANGIOPLASTY WITH STENT PLACEMENT  2002; 2010; 04/23/2012   "1 + 2 + 1; total of 4"  . EXCISIONAL HEMORRHOIDECTOMY  yrs ago  . HERNIA REPAIR     points to stomach  . LEFT HEART CATHETERIZATION WITH CORONARY ANGIOGRAM N/A 04/23/2012   Procedure: LEFT HEART CATHETERIZATION WITH CORONARY ANGIOGRAM;  Surgeon: Burnell Blanks, MD;  [facility] LAB;  Service: Cardiovascular;  Laterality: N/A;  . MASS EXCISION N/A 08/29/2017   Procedure: EXCISIONAL BIOPSY OF BACK SOFT TISSUE MASS;  Surgeon: Greer Pickerel, MD;  Location: [facility] ORS;  Service: General;  Laterality: N/A;  . TONSILLECTOMY  early teens     Current Outpatient Medications  Medication Sig Dispense Refill  . acetaminophen (TYLENOL) 500 MG tablet Take 1,000 mg by mouth every 6 (six) hours as needed  for mild pain. For pain    . amoxicillin (AMOXIL) 500 MG capsule TAKE 4 CAPSULES BY MOUTH 1 HOUR BEFORE TO DENTAL APPOINTMENT    . aspirin 81 MG tablet Take 81 mg by mouth at bedtime.    Marland Kitchen atorvastatin (LIPITOR) 20 MG tablet TAKE 1 TABLET BY MOUTH EVERY DAY 90 tablet 3  . azelastine (ASTELIN) 0.1 % nasal spray Place 2 sprays into both nostrils 2 (two) times daily. 30 mL 5  . Cholecalciferol (HM VITAMIN D3) 100 MCG (4000 UT) CAPS Take by mouth daily.    Marland Kitchen ezetimibe (ZETIA) 10 MG tablet TAKE 1 TABLET BY MOUTH EVERY DAY 90 tablet 2  . hydroxychloroquine (PLAQUENIL) 200 MG tablet Take 200 mg by mouth at  bedtime.    Marland Kitchen ibuprofen (ADVIL,MOTRIN) 200 MG tablet Take 400 mg by mouth daily as needed for moderate pain.    Marland Kitchen LORazepam (ATIVAN) 0.5 MG tablet as needed.     . metFORMIN (GLUCOPHAGE) 500 MG tablet Take 1,000 mg by mouth daily with breakfast.    . metoprolol succinate (TOPROL-XL) 25 MG 24 hr tablet TAKE 1 TABLET BY MOUTH EVERY DAY 90 tablet 3  . nitroGLYCERIN (NITROSTAT) 0.4 MG SL tablet PLACE 1 TABLET (0.4 MG TOTAL) UNDER THE TONGUE EVERY 5 (FIVE) MINUTES AS NEEDED. FOR CHEST PAIN 25 tablet 3  . sertraline (ZOLOFT) 50 MG tablet as needed.     . triamcinolone ointment (KENALOG) 0.1 %      No current facility-administered medications for this visit.    Allergies:   Patient has no known allergies.    Social History:  The patient  reports that she quit smoking about 8 years ago. Her smoking use included cigarettes. She has a 57.00 pack-year smoking history. She has never used smokeless tobacco. She reports current alcohol use of about 14.0 standard drinks of alcohol per week. She reports current drug use. Drug: Marijuana.   Family History:  The patient's family history includes Heart disease in her father; Heart failure in her father; Hypertension in her father and mother; Lung cancer in her mother; Stroke in her father.    ROS:  Please see the history of present illness.   Otherwise, review of systems are positive for none.   All other systems are reviewed and negative.    PHYSICAL EXAM: VS:  BP 136/90   Pulse 66   Ht 4\' 11"  (1.499 m)   Wt 130 lb 12.8 oz (59.3 kg)   SpO2 100%   BMI 26.42 kg/m  , BMI Body mass index is 26.42 kg/m. GEN: Well nourished, well developed, in no acute distress  HEENT: normal  Neck: no JVD, carotid bruits, or masses Cardiac: RRR; no  rubs, or gallops,no edema .  1/ 6 systolic murmur in the aortic area Respiratory:  clear to auscultation bilaterally, normal work of breathing GI: soft, nontender, nondistended, + BS MS: no deformity or atrophy  Skin:  warm and dry, no rash Neuro:  Strength and sensation are intact Psych: euthymic mood, full affect Vascular:  Distal pulses are not palpable.   EKG:  EKG is not  ordered today. I reviewed her EKG from January which was done at the ED.  Showed sinus rhythm with inferior T wave changes suggestive of ischemia.  These changes were more prominent than baseline.   Recent Labs: 05/30/2020: ALT 15 07/29/2020: BUN 13; Creatinine, Ser 0.76; Hemoglobin 11.3; Platelets 324; Potassium 3.9; Sodium 141    Lipid Panel    Component Value  Date/Time   CHOL 107 05/30/2020 0841   TRIG 66 05/30/2020 0841   HDL 39 (L) 05/30/2020 0841   CHOLHDL 2.7 05/30/2020 0841   CHOLHDL 3.6 06/04/2016 0811   VLDL 14 06/04/2016 0811   LDLCALC 54 05/30/2020 0841      Wt Readings from Last 3 Encounters:  10/18/20 130 lb 12.8 oz (59.3 kg)  05/30/20 130 lb 6.4 oz (59.1 kg)  08/25/19 135 lb (61.2 kg)       No flowsheet data found.    ASSESSMENT AND PLAN:  1.  Peripheral arterial disease: She continues have mild bilateral calf claudication which does not seem to be lifestyle limiting.  She has not been doing much walking in the winter months and I encouraged her to start doing at least 30 minutes of walking every day.  Her symptoms are still not lifestyle limiting and thus I recommend continuing medical therapy.  2.  Coronary artery disease involving native coronary arteries with other forms of angina: She reports continued intermittent chest pain with some atypical features.  Symptoms did not resolve completely with Protonix.  She is concerned that this might be cardiac.  I am going to arrange for a [facility] and will discuss with Dr. Angelena Form.    3.  Essential hypertension: Blood pressure is reasonably controlled  4.  Hyperlipidemia: Continue treatment with atorvastatin and Zetia.  Most recent lipid profile showed an LDL of 54 and triglyceride of 66.   Disposition:   FU with me in 12  months  Signed,  Kathlyn Sacramento, MD  10/18/2020 8:16 AM    [place]

## 2020-10-18 NOTE — Patient Instructions (Addendum)
Medication Instructions:  No changes *If you need a refill on your cardiac medications before your next appointment, please call your pharmacy*   Lab Work: None ordered  If you have labs (blood work) drawn today and your tests are completely normal, you will receive your results only by: Marland Kitchen MyChart Message (if you have MyChart) OR . A paper copy in the mail If you have any lab test that is abnormal or we need to change your treatment, we will call you to review the results.   Testing/Procedures: Your physician has requested that you have a lexiscan myoview. For further information please visit [email or web address]. Please follow instruction sheet, as given.  Nothing to hold.    Follow-Up: At [facility], you and your health needs are our priority.  As part of our continuing mission to provide you with exceptional heart care, we have created designated Provider Care Teams.  These Care Teams include your primary Cardiologist (physician) and Advanced Practice Providers (APPs -  Physician Assistants and Nurse Practitioners) who all work together to provide you with the care you need, when you need it.  We recommend signing up for the patient portal called "MyChart".  Sign up information is provided on this After Visit Summary.  MyChart is used to connect with patients for Virtual Visits (Telemedicine).  Patients are able to view lab/test results, encounter notes, upcoming appointments, etc.  Non-urgent messages can be sent to your provider as well.   To learn more about what you can do with MyChart, go to [email or web address].    Your next appointment:   12 month(s)  The format for your next appointment:   In Person  Provider:   Kathlyn Sacramento, MD

## 2020-10-20 ENCOUNTER — Telehealth (HOSPITAL_COMMUNITY): Payer: Self-pay

## 2020-10-20 NOTE — Telephone Encounter (Signed)
Close encounter 

## 2020-10-24 DIAGNOSIS — Z1231 Encounter for screening mammogram for malignant neoplasm of breast: Secondary | ICD-10-CM | POA: Diagnosis not present

## 2020-10-24 DIAGNOSIS — Z7689 Persons encountering health services in other specified circumstances: Secondary | ICD-10-CM | POA: Diagnosis not present

## 2020-10-24 DIAGNOSIS — Z13820 Encounter for screening for osteoporosis: Secondary | ICD-10-CM | POA: Diagnosis not present

## 2020-10-24 DIAGNOSIS — Z01419 Encounter for gynecological examination (general) (routine) without abnormal findings: Secondary | ICD-10-CM | POA: Diagnosis not present

## 2020-10-25 ENCOUNTER — Ambulatory Visit (HOSPITAL_COMMUNITY)
Admission: RE | Admit: 2020-10-25 | Discharge: 2020-10-25 | Disposition: A | Discharge status: Home / Self Care | Payer: Federal, State, Local not specified - PPO | Source: Ambulatory Visit | Attending: Cardiovascular Disease | Admitting: Cardiovascular Disease

## 2020-10-25 ENCOUNTER — Other Ambulatory Visit: Payer: Self-pay

## 2020-10-25 DIAGNOSIS — R079 Chest pain, unspecified: Secondary | ICD-10-CM | POA: Diagnosis not present

## 2020-10-25 LAB — MYOCARDIAL PERFUSION IMAGING
LV dias vol: 63 mL (ref 46–106)
LV sys vol: 24 mL
Peak HR: 107 {beats}/min
Rest HR: 53 {beats}/min
SDS: 5
SRS: 2
SSS: 7
TID: 0.91

## 2020-10-25 MED ORDER — TECHNETIUM TC 99M TETROFOSMIN IV KIT
10.1000 | PACK | Freq: Once | INTRAVENOUS | Status: AC | PRN
  Administered 2020-10-25: 10.1 via INTRAVENOUS
  Filled 2020-10-25: qty 11

## 2020-10-25 MED ORDER — REGADENOSON 0.4 MG/5ML IV SOLN
0.4000 mg | Freq: Once | INTRAVENOUS | Status: AC
Start: 2020-10-25 — End: 2020-10-25
  Administered 2020-10-25: 0.4 mg via INTRAVENOUS

## 2020-10-25 MED ORDER — TECHNETIUM TC 99M TETROFOSMIN IV KIT
31.0000 | PACK | Freq: Once | INTRAVENOUS | Status: AC | PRN
  Administered 2020-10-25: 31 via INTRAVENOUS
  Filled 2020-10-25: qty 31

## 2020-10-26 ENCOUNTER — Encounter: Payer: Self-pay | Admitting: *Deleted

## 2020-10-31 ENCOUNTER — Telehealth: Payer: Self-pay | Admitting: Cardiovascular Disease

## 2020-10-31 NOTE — Telephone Encounter (Signed)
Called patient, advised of message from MD.  Patient verbalized understanding.   

## 2020-10-31 NOTE — Telephone Encounter (Signed)
I do not think her symptoms are cardiac.  Her stress test looked fine.  Suspect a GI etiology.  She might be having esophageal spasm related to GERD.  I suggest she follows up with her primary care physician regarding this.

## 2020-10-31 NOTE — Telephone Encounter (Signed)
Patient feels a weird vibrating sensation in her chest.IT will only last a couple seconds at a time before it stops.  It feels like she has a balloon that is slowly releasing air in her chest. She is not in pain, but it is a weird sensation. She said she mentioned this to Dr. Fletcher Anon at her appt 4/5.  She also went to the ER and was told that it was acid reflux, but that's not what she thinks it is

## 2020-10-31 NOTE — Telephone Encounter (Signed)
Called patient, she states that she wanted to make Dr.Arida aware that she is continuing to have the same issues as she mentioned to him on April 5th, patient states that she has a vibrating sensation, it is not painful, does not come with activities but comes and goes. She is not able to check her BP/HR at home. She has had no med changes. She denies SOB, or swelling. I advised with patient I would route a message over to Dr.Arida, she was feeling better after telling her the stress test was normal- but she wanted to make sure he did not have any other suggestions.   Thank you!

## 2020-11-03 DIAGNOSIS — M8580 Other specified disorders of bone density and structure, unspecified site: Secondary | ICD-10-CM | POA: Diagnosis not present

## 2020-11-22 DIAGNOSIS — M329 Systemic lupus erythematosus, unspecified: Secondary | ICD-10-CM | POA: Diagnosis not present

## 2020-11-22 DIAGNOSIS — H2513 Age-related nuclear cataract, bilateral: Secondary | ICD-10-CM | POA: Diagnosis not present

## 2020-11-22 DIAGNOSIS — Z79899 Other long term (current) drug therapy: Secondary | ICD-10-CM | POA: Diagnosis not present

## 2020-11-22 DIAGNOSIS — H04123 Dry eye syndrome of bilateral lacrimal glands: Secondary | ICD-10-CM | POA: Diagnosis not present

## 2020-12-02 ENCOUNTER — Other Ambulatory Visit: Payer: Self-pay | Admitting: Family Medicine

## 2020-12-02 DIAGNOSIS — R911 Solitary pulmonary nodule: Secondary | ICD-10-CM

## 2020-12-20 DIAGNOSIS — E559 Vitamin D deficiency, unspecified: Secondary | ICD-10-CM | POA: Diagnosis not present

## 2020-12-20 DIAGNOSIS — I10 Essential (primary) hypertension: Secondary | ICD-10-CM | POA: Diagnosis not present

## 2020-12-20 DIAGNOSIS — E782 Mixed hyperlipidemia: Secondary | ICD-10-CM | POA: Diagnosis not present

## 2020-12-20 DIAGNOSIS — E119 Type 2 diabetes mellitus without complications: Secondary | ICD-10-CM | POA: Diagnosis not present

## 2020-12-26 ENCOUNTER — Other Ambulatory Visit: Payer: Self-pay

## 2020-12-26 ENCOUNTER — Ambulatory Visit
Admission: RE | Admit: 2020-12-26 | Discharge: 2020-12-26 | Disposition: A | Discharge status: Home / Self Care | Payer: Federal, State, Local not specified - PPO | Source: Ambulatory Visit | Attending: Family Medicine | Admitting: Family Medicine

## 2020-12-26 DIAGNOSIS — R918 Other nonspecific abnormal finding of lung field: Secondary | ICD-10-CM | POA: Diagnosis not present

## 2020-12-26 DIAGNOSIS — R911 Solitary pulmonary nodule: Secondary | ICD-10-CM

## 2021-03-05 ENCOUNTER — Encounter (HOSPITAL_COMMUNITY): Payer: Self-pay | Admitting: Emergency Medicine

## 2021-03-05 ENCOUNTER — Emergency Department (HOSPITAL_COMMUNITY): Payer: Federal, State, Local not specified - PPO

## 2021-03-05 ENCOUNTER — Emergency Department (HOSPITAL_COMMUNITY)
Admission: EM | Admit: 2021-03-05 | Discharge: 2021-03-05 | Disposition: A | Discharge status: Home / Self Care | Payer: Federal, State, Local not specified - PPO | Attending: Emergency Medicine | Admitting: Emergency Medicine

## 2021-03-05 DIAGNOSIS — Z7984 Long term (current) use of oral hypoglycemic drugs: Secondary | ICD-10-CM | POA: Diagnosis not present

## 2021-03-05 DIAGNOSIS — Z7982 Long term (current) use of aspirin: Secondary | ICD-10-CM | POA: Diagnosis not present

## 2021-03-05 DIAGNOSIS — R0789 Other chest pain: Secondary | ICD-10-CM

## 2021-03-05 DIAGNOSIS — I2510 Atherosclerotic heart disease of native coronary artery without angina pectoris: Secondary | ICD-10-CM | POA: Diagnosis not present

## 2021-03-05 DIAGNOSIS — R079 Chest pain, unspecified: Secondary | ICD-10-CM | POA: Diagnosis not present

## 2021-03-05 DIAGNOSIS — K219 Gastro-esophageal reflux disease without esophagitis: Secondary | ICD-10-CM | POA: Insufficient documentation

## 2021-03-05 DIAGNOSIS — J439 Emphysema, unspecified: Secondary | ICD-10-CM | POA: Diagnosis not present

## 2021-03-05 DIAGNOSIS — I10 Essential (primary) hypertension: Secondary | ICD-10-CM | POA: Insufficient documentation

## 2021-03-05 DIAGNOSIS — Z79899 Other long term (current) drug therapy: Secondary | ICD-10-CM | POA: Diagnosis not present

## 2021-03-05 DIAGNOSIS — E119 Type 2 diabetes mellitus without complications: Secondary | ICD-10-CM | POA: Diagnosis not present

## 2021-03-05 DIAGNOSIS — Z87891 Personal history of nicotine dependence: Secondary | ICD-10-CM | POA: Diagnosis not present

## 2021-03-05 LAB — CBC
HCT: 35.9 % — ABNORMAL LOW (ref 36.0–46.0)
Hemoglobin: 11.4 g/dL — ABNORMAL LOW (ref 12.0–15.0)
MCH: 28.9 pg (ref 26.0–34.0)
MCHC: 31.8 g/dL (ref 30.0–36.0)
MCV: 90.9 fL (ref 80.0–100.0)
Platelets: 329 10*3/uL (ref 150–400)
RBC: 3.95 MIL/uL (ref 3.87–5.11)
RDW: 14.3 % (ref 11.5–15.5)
WBC: 5.5 10*3/uL (ref 4.0–10.5)
nRBC: 0 % (ref 0.0–0.2)

## 2021-03-05 LAB — BASIC METABOLIC PANEL
Anion gap: 8 (ref 5–15)
BUN: 22 mg/dL (ref 8–23)
CO2: 26 mmol/L (ref 22–32)
Calcium: 9.5 mg/dL (ref 8.9–10.3)
Chloride: 104 mmol/L (ref 98–111)
Creatinine, Ser: 1.08 mg/dL — ABNORMAL HIGH (ref 0.44–1.00)
GFR, Estimated: 58 mL/min — ABNORMAL LOW (ref >60)
Glucose, Bld: 122 mg/dL — ABNORMAL HIGH (ref 70–99)
Potassium: 4.1 mmol/L (ref 3.5–5.1)
Sodium: 138 mmol/L (ref 135–145)

## 2021-03-05 LAB — TROPONIN I (HIGH SENSITIVITY)
Troponin I (High Sensitivity): 4 ng/L (ref <18)
Troponin I (High Sensitivity): 3 ng/L (ref <18)

## 2021-03-05 MED ORDER — FAMOTIDINE 20 MG PO TABS
40.0000 mg | ORAL_TABLET | Freq: Every day | ORAL | 0 refills | Status: DC
Start: 2021-03-05 — End: 2021-06-19

## 2021-03-05 NOTE — ED Triage Notes (Signed)
Pt reports fullness and tightness to L chest x 3 days.  Denies sob, nausea, and vomiting.  Reports diaphoresis today. Didn't take her NTG because she states it feels different than her MI.

## 2021-03-05 NOTE — ED Notes (Signed)
Pt ambulatory at d/c. VSS. GCS 15.

## 2021-03-05 NOTE — ED Notes (Signed)
Patient transported to X-ray 

## 2021-03-05 NOTE — ED Provider Notes (Signed)
[place] EMERGENCY DEPARTMENT Provider Note   CSN: [id number] Arrival date & time: 03/05/21  1657     History Chief Complaint  Patient presents with   Chest Pain    [name] is a 65 y.o. female.  65 year old female with prior medical history as detailed below presents for evaluation.  Patient complains of vague left sided discomfort.  Patient locates this discomfort underneath the left breast.  It has been present for the last 3 days.  Her discomfort is constant.  She denies associated nausea, vomiting, shortness of breath, diaphoresis, fever, or other complaint.    The history is provided by the patient.  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"   Depression    Diabetes mellitus without complication ([place])    type 2 dx july 2018   H/O: substance abuse ([place])    Hyperlipidemia    Mass on back    Myocardial infarction [facility])    "not sure; nobody ever tells me" (04/23/2012)   Noncompliance    PVD (peripheral vascular disease) ([place])    MILD    Patient Active Problem List   Diagnosis Date Noted   Hypokalemia 04/24/2012   Unstable angina ([place]) 04/24/2012   Hyperglycemia 04/24/2012   Chest pain 04/23/2012   Intermediate coronary syndrome ([place]) 04/23/2012   Discoid lupus erythematosus 04/09/2012   Single skin nodule 04/09/2012   CAD (coronary artery disease) 05/29/2011   HTN  (hypertension) 12/04/2010   Tobacco abuse 12/04/2010   Bruising    IHD (ischemic heart disease)    Hyperlipidemia    Cutaneous lupus erythematosus    Anxiety     Past Surgical History:  Procedure Laterality Date   ABDOMINAL HYSTERECTOMY  1999   ovaries removed weeks later   ANTERIOR CRUCIATE LIGAMENT REPAIR  lasy February 02 2015   "have had 15 surgeries on my right ACL"   BREAST SURGERY     "removed knot from one side"   [place]  2002; 2010; 04/23/2012   "1 + 2 + 1; total of 4"   EXCISIONAL HEMORRHOIDECTOMY  yrs ago   [place]     points to stomach   LEFT HEART CATHETERIZATION WITH CORONARY ANGIOGRAM N/A 04/23/2012   Procedure: LEFT HEART CATHETERIZATION WITH CORONARY ANGIOGRAM;  Surgeon: Burnell Blanks, MD;  Location: [facility] CATH LAB;  Service: Cardiovascular;  Laterality: N/A;   MASS EXCISION N/A 08/29/2017   Procedure: EXCISIONAL BIOPSY OF BACK SOFT TISSUE MASS;  Surgeon: Greer Pickerel, MD;  Location: [facility] ORS;  Service: General;  Laterality: N/A;   TONSILLECTOMY  early teens     OB History   No obstetric history on file.     Family History  Problem Relation Age of Onset   Hypertension Mother    Lung cancer Mother  Heart disease Father    Hypertension Father    Stroke Father    Heart failure Father     Social History   Tobacco Use   Smoking status: Former    Packs/day: 1.50    Years: 38.00    Pack years: 57.00    Types: Cigarettes    Quit date: 04/20/2012    Years since quitting: 8.8   Smokeless tobacco: Never  Vaping Use   Vaping Use: Never used  Substance Use Topics   Alcohol use: Yes    Alcohol/week: 14.0 standard drinks    Types: 14 Cans of beer per week    Comment: beer 2 x week    Drug use: Yes    Types: Marijuana    Comment: smoked marijuana 09-04-16    Home Medications Prior to Admission medications   Medication Sig Start Date End Date Taking? Authorizing Provider  acetaminophen (TYLENOL) 500 MG tablet  Take 1,000 mg by mouth every 6 (six) hours as needed for mild pain. For pain   Yes [provider]  amoxicillin (AMOXIL) 500 MG capsule TAKE 4 CAPSULES BY MOUTH 1 HOUR BEFORE TO DENTAL APPOINTMENT 12/30/18  Yes [provider]  aspirin 81 MG tablet Take 81 mg by mouth at bedtime.   Yes [provider]  atorvastatin (LIPITOR) 20 MG tablet TAKE 1 TABLET BY MOUTH EVERY DAY 06/21/20  Yes Burnell Blanks, MD  Cholecalciferol (HM VITAMIN D3) 100 MCG (4000 UT) CAPS Take by mouth daily.   Yes [provider]  ezetimibe (ZETIA) 10 MG tablet TAKE 1 TABLET BY MOUTH EVERY DAY 09/26/20  Yes Burnell Blanks, MD  famotidine (PEPCID) 20 MG tablet Take 2 tablets (40 mg total) by mouth daily. 03/05/21  Yes Valarie Merino, MD  hydroxychloroquine (PLAQUENIL) 200 MG tablet Take 200 mg by mouth at bedtime.   Yes [provider]  ibuprofen (ADVIL,MOTRIN) 200 MG tablet Take 400 mg by mouth daily as needed for moderate pain.   Yes [provider]  LORazepam (ATIVAN) 0.5 MG tablet as needed.  06/26/18  Yes [provider]  metFORMIN (GLUCOPHAGE-XR) 500 MG 24 hr tablet Take 1,000 mg by mouth daily.   Yes [provider]  metoprolol succinate (TOPROL-XL) 25 MG 24 hr tablet TAKE 1 TABLET BY MOUTH EVERY DAY 06/21/20  Yes Burnell Blanks, MD  Multiple Vitamin (MULTIVITAMIN) tablet Take 1 tablet by mouth daily.   Yes [provider]  nitroGLYCERIN (NITROSTAT) 0.4 MG SL tablet PLACE 1 TABLET (0.4 MG TOTAL) UNDER THE TONGUE EVERY 5 (FIVE) MINUTES AS NEEDED. FOR CHEST PAIN 05/07/19  Yes Burnell Blanks, MD  triamcinolone ointment (KENALOG) 0.1 % Apply 1 application topically daily as needed (lupus flares). 06/24/18  Yes [provider]  azelastine (ASTELIN) 0.1 % nasal spray Place 2 sprays into both nostrils 2 (two) times daily. Patient not taking: No sig reported 08/13/18   Kennith Gain, MD  sertraline  (ZOLOFT) 50 MG tablet as needed.  Patient not taking: No sig reported 06/26/18   [provider]    Allergies    Patient has no known allergies.  Review of Systems   Review of Systems  Cardiovascular:  Positive for chest pain.  All other systems reviewed and are negative.  Physical Exam Updated Vital Signs BP 122/77   Pulse 72   Temp 98.3 F (36.8 C) (Oral)   Resp 16   SpO2 99%   Physical Exam Vitals and nursing note  reviewed.  Constitutional:      General: She is not in acute distress.    Appearance: Normal appearance. She is well-developed.  HENT:     Head: Normocephalic and atraumatic.  Eyes:     Conjunctiva/sclera: Conjunctivae normal.     Pupils: Pupils are equal, round, and reactive to light.  Cardiovascular:     Rate and Rhythm: Normal rate and regular rhythm.     Heart sounds: Normal heart sounds.  Pulmonary:     Effort: Pulmonary effort is normal. No respiratory distress.     Breath sounds: Normal breath sounds.  Abdominal:     General: There is no distension.     Palpations: Abdomen is soft.     Tenderness: There is no abdominal tenderness.  Musculoskeletal:        General: No deformity. Normal range of motion.     Cervical back: Normal range of motion and neck supple.  Skin:    General: Skin is warm and dry.  Neurological:     General: No focal deficit present.     Mental Status: She is alert and oriented to person, place, and time.    ED Results / Procedures / Treatments   Labs (all labs ordered are listed, but only abnormal results are displayed) Labs Reviewed  BASIC METABOLIC PANEL - Abnormal; Notable for the following components:      Result Value   Glucose, Bld 122 (*)    Creatinine, Ser 1.08 (*)    GFR, Estimated 58 (*)    All other components within normal limits  CBC - Abnormal; Notable for the following components:   Hemoglobin 11.4 (*)    HCT 35.9 (*)    All other components within normal limits  TROPONIN I (HIGH  SENSITIVITY)  TROPONIN I (HIGH SENSITIVITY)    EKG EKG Interpretation  Date/Time:  Sunday March 05 2021 17:02:07 EDT Ventricular Rate:  67 PR Interval:  150 QRS Duration: 72 QT Interval:  384 QTC Calculation: 405 R Axis:   51 Text Interpretation: Normal sinus rhythm Normal ECG Confirmed by Dene Gentry [phone]) on 03/05/2021 6:12:49 PM  Radiology DG Chest 2 View  Result Date: 03/05/2021 CLINICAL DATA:  Chest pain. Left-sided chest fullness going to the mid sternum and to the back for 3 days. EXAM: CHEST - 2 VIEW COMPARISON:  CT chest 12/26/2020 FINDINGS: Normal heart size and pulmonary vascularity. Emphysematous changes in the lungs. No focal airspace disease or consolidation in the lungs. No blunting of costophrenic angles. No pneumothorax. Mediastinal contours appear intact. Surgical clips in the left chest. Calcification of the aorta. Degenerative changes in the spine and shoulders. Mild thoracic scoliosis convex towards the right. IMPRESSION: Emphysematous changes in the lungs. No evidence of active pulmonary disease. Electronically Signed   By: Lucienne Capers M.D.   On: 03/05/2021 17:49    Procedures Procedures   Medications Ordered in ED Medications - No data to display  ED Course  I have reviewed the triage vital signs and the nursing notes.  Pertinent labs & imaging results that were available during my care of the patient were reviewed by me and considered in my medical decision making (see chart for details).    MDM Rules/Calculators/A&P                           MDM  MSE complete  [name] was evaluated in Emergency Department on 03/05/2021 for the symptoms described in the  history of present illness. She was evaluated in the context of the global COVID-19 pandemic, which necessitated consideration that the patient might be at risk for infection with the SARS-CoV-2 virus that causes COVID-19. Institutional protocols and algorithms that pertain to the  evaluation of patients at risk for COVID-19 are in a state of rapid change based on information released by regulatory bodies including the CDC and federal and state organizations. These policies and algorithms were followed during the patient's care in the ED.  Patient is presenting with atypical chest discomfort.  Describe symptoms are not typical of ACS.  EKG is without evidence of acute ischemia.  Troponins x2 are barely detectable.  Patient's discomfort has been constantly present for at least 3 days.  Other screening labs are without significant abnormality.  Patient does report prior history of GERD.  Patient's symptoms may be reflective of this.  She is advised to trial Pepcid at home.  Importance of close follow-up with her regular care provider is repeatedly stressed.  Strict return precautions given and understood. Final Clinical Impression(s) / ED Diagnoses Final diagnoses:  Atypical chest pain    Rx / DC Orders ED Discharge Orders          Ordered    famotidine (PEPCID) 20 MG tablet  Daily        03/05/21 2022             Valarie Merino, MD 03/05/21 2024

## 2021-03-05 NOTE — ED Notes (Signed)
I obtained second troponin from patient. I attempted IV, but was unable to get one. Pt denies further needs.

## 2021-03-05 NOTE — ED Notes (Signed)
Pt here with L sided chest "fullness" going up to mid-sternum and to back. Denies nausea or shortness of breath. Started 3 days ago and it's constant. Nothing makes it better or worse. Says it doesn't feel like pain, just like she's full. AxO x4. GCS 15.

## 2021-03-05 NOTE — Discharge Instructions (Addendum)
Return for any problem.  ?

## 2021-03-06 ENCOUNTER — Telehealth: Payer: Self-pay | Admitting: Cardiovascular Disease

## 2021-03-06 DIAGNOSIS — Z955 Presence of coronary angioplasty implant and graft: Secondary | ICD-10-CM | POA: Diagnosis not present

## 2021-03-06 DIAGNOSIS — I2510 Atherosclerotic heart disease of native coronary artery without angina pectoris: Secondary | ICD-10-CM | POA: Diagnosis not present

## 2021-03-06 DIAGNOSIS — R0789 Other chest pain: Secondary | ICD-10-CM | POA: Diagnosis not present

## 2021-03-06 DIAGNOSIS — K219 Gastro-esophageal reflux disease without esophagitis: Secondary | ICD-10-CM | POA: Diagnosis not present

## 2021-03-06 NOTE — Telephone Encounter (Signed)
pt was in the er lastnight, pt thought she was having a heart attack but was experiencing acid reflux

## 2021-03-06 NOTE — Telephone Encounter (Signed)
Called pt back in regards to ED visit.  Pt just wanted to let the office know that she was seen in the ED.  I informed pt that discharge summary stated that she should start pepcid and f/u with PCP.  She reports she has already seen PCP.  No further f/u needed with cardiology.

## 2021-03-08 DIAGNOSIS — R079 Chest pain, unspecified: Secondary | ICD-10-CM | POA: Diagnosis not present

## 2021-03-10 ENCOUNTER — Emergency Department (HOSPITAL_COMMUNITY)
Admission: EM | Admit: 2021-03-10 | Discharge: 2021-03-10 | Disposition: A | Discharge status: Home / Self Care | Payer: Federal, State, Local not specified - PPO | Attending: Emergency Medicine | Admitting: Emergency Medicine

## 2021-03-10 ENCOUNTER — Encounter (HOSPITAL_COMMUNITY): Payer: Self-pay

## 2021-03-10 ENCOUNTER — Emergency Department (HOSPITAL_COMMUNITY): Payer: Federal, State, Local not specified - PPO

## 2021-03-10 ENCOUNTER — Telehealth: Payer: Self-pay | Admitting: Cardiovascular Disease

## 2021-03-10 ENCOUNTER — Other Ambulatory Visit: Payer: Self-pay

## 2021-03-10 DIAGNOSIS — R0602 Shortness of breath: Secondary | ICD-10-CM | POA: Diagnosis not present

## 2021-03-10 DIAGNOSIS — E119 Type 2 diabetes mellitus without complications: Secondary | ICD-10-CM | POA: Insufficient documentation

## 2021-03-10 DIAGNOSIS — I2510 Atherosclerotic heart disease of native coronary artery without angina pectoris: Secondary | ICD-10-CM | POA: Diagnosis not present

## 2021-03-10 DIAGNOSIS — I25119 Atherosclerotic heart disease of native coronary artery with unspecified angina pectoris: Secondary | ICD-10-CM

## 2021-03-10 DIAGNOSIS — R079 Chest pain, unspecified: Secondary | ICD-10-CM | POA: Insufficient documentation

## 2021-03-10 DIAGNOSIS — Z7984 Long term (current) use of oral hypoglycemic drugs: Secondary | ICD-10-CM | POA: Diagnosis not present

## 2021-03-10 DIAGNOSIS — Z87891 Personal history of nicotine dependence: Secondary | ICD-10-CM | POA: Diagnosis not present

## 2021-03-10 DIAGNOSIS — I700 Atherosclerosis of aorta: Secondary | ICD-10-CM | POA: Diagnosis not present

## 2021-03-10 DIAGNOSIS — K219 Gastro-esophageal reflux disease without esophagitis: Secondary | ICD-10-CM | POA: Diagnosis not present

## 2021-03-10 DIAGNOSIS — Z7982 Long term (current) use of aspirin: Secondary | ICD-10-CM | POA: Insufficient documentation

## 2021-03-10 DIAGNOSIS — R0789 Other chest pain: Secondary | ICD-10-CM | POA: Diagnosis not present

## 2021-03-10 DIAGNOSIS — J9811 Atelectasis: Secondary | ICD-10-CM | POA: Diagnosis not present

## 2021-03-10 DIAGNOSIS — Z79899 Other long term (current) drug therapy: Secondary | ICD-10-CM | POA: Diagnosis not present

## 2021-03-10 DIAGNOSIS — I10 Essential (primary) hypertension: Secondary | ICD-10-CM | POA: Diagnosis not present

## 2021-03-10 DIAGNOSIS — Z20822 Contact with and (suspected) exposure to covid-19: Secondary | ICD-10-CM | POA: Diagnosis not present

## 2021-03-10 LAB — CBC WITH DIFFERENTIAL/PLATELET
Abs Immature Granulocytes: 0.01 10*3/uL (ref 0.00–0.07)
Basophils Absolute: 0 10*3/uL (ref 0.0–0.1)
Basophils Relative: 0 %
Eosinophils Absolute: 0 10*3/uL (ref 0.0–0.5)
Eosinophils Relative: 1 %
HCT: 40 % (ref 36.0–46.0)
Hemoglobin: 12.7 g/dL (ref 12.0–15.0)
Immature Granulocytes: 0 %
Lymphocytes Relative: 54 %
Lymphs Abs: 3 10*3/uL (ref 0.7–4.0)
MCH: 28.7 pg (ref 26.0–34.0)
MCHC: 31.8 g/dL (ref 30.0–36.0)
MCV: 90.3 fL (ref 80.0–100.0)
Monocytes Absolute: 0.5 10*3/uL (ref 0.1–1.0)
Monocytes Relative: 8 %
Neutro Abs: 2 10*3/uL (ref 1.7–7.7)
Neutrophils Relative %: 37 %
Platelets: 359 10*3/uL (ref 150–400)
RBC: 4.43 MIL/uL (ref 3.87–5.11)
RDW: 14.4 % (ref 11.5–15.5)
WBC: 5.6 10*3/uL (ref 4.0–10.5)
nRBC: 0 % (ref 0.0–0.2)

## 2021-03-10 LAB — TROPONIN I (HIGH SENSITIVITY)
Troponin I (High Sensitivity): 3 ng/L (ref <18)
Troponin I (High Sensitivity): 3 ng/L (ref <18)

## 2021-03-10 LAB — COMPREHENSIVE METABOLIC PANEL
ALT: 25 U/L (ref 0–44)
AST: 22 U/L (ref 15–41)
Albumin: 4.5 g/dL (ref 3.5–5.0)
Alkaline Phosphatase: 68 U/L (ref 38–126)
Anion gap: 9 (ref 5–15)
BUN: 14 mg/dL (ref 8–23)
CO2: 25 mmol/L (ref 22–32)
Calcium: 9.8 mg/dL (ref 8.9–10.3)
Chloride: 106 mmol/L (ref 98–111)
Creatinine, Ser: 0.79 mg/dL (ref 0.44–1.00)
GFR, Estimated: >60 mL/min (ref >60)
Glucose, Bld: 115 mg/dL — ABNORMAL HIGH (ref 70–99)
Potassium: 4 mmol/L (ref 3.5–5.1)
Sodium: 140 mmol/L (ref 135–145)
Total Bilirubin: 0.6 mg/dL (ref 0.3–1.2)
Total Protein: 7.4 g/dL (ref 6.5–8.1)

## 2021-03-10 LAB — LIPASE, BLOOD: Lipase: 28 U/L (ref 11–51)

## 2021-03-10 MED ORDER — MORPHINE SULFATE (PF) 4 MG/ML IV SOLN
4.0000 mg | Freq: Once | INTRAVENOUS | Status: AC
  Administered 2021-03-10: 4 mg via INTRAVENOUS
  Filled 2021-03-10: qty 1

## 2021-03-10 MED ORDER — IOHEXOL 350 MG/ML SOLN
75.0000 mL | Freq: Once | INTRAVENOUS | Status: AC | PRN
  Administered 2021-03-10: 75 mL via INTRAVENOUS

## 2021-03-10 NOTE — Telephone Encounter (Signed)
Returned call to patient. She states that she is currently at the GI office and that the MD there is sending her to the ER now.

## 2021-03-10 NOTE — Discharge Instructions (Addendum)
Call your primary care doctor or specialist as discussed in the next 2-3 days.   Return immediately back to the ER if:  Your symptoms worsen within the next 12-24 hours. You develop new symptoms such as new fevers, persistent vomiting, new pain, shortness of breath, or new weakness or numbness, or if you have any other concerns.  

## 2021-03-10 NOTE — ED Notes (Signed)
Patient transported to CT 

## 2021-03-10 NOTE — ED Triage Notes (Addendum)
Pt states that she has been having CP for months now & has been told that she is having acid reflux. Pt reports the CP to radiate to her left shoulder, side of neck & arm. Rates pain 10/10. She was encouraged to see a gastrologist but came here d/t the pain level so she can see a cardiologist. Reports feeling clammy and anxious that her old stents have something "going on with them."

## 2021-03-10 NOTE — Telephone Encounter (Signed)
Called pt back in regards to acid reflux.  She reports that reflux has continued since her ED visit (03/05/21) and med change.  She reports that she has a sensation in her chest that feels like a cell phone vibrating.   I informed pt that her stress test from 10/25/20 and troponin from 03/05/21 were both normal and that is reassuring.  I advised pt to reach out to PCP for f/u per discharge summary.  She reports that PCP has not helped her. She takes prescribed meds as ordered.   I advised her to inquire about a GI referral.  She reports that she will call PCP for a GI referral.  I advised her to call back with further concerns.

## 2021-03-10 NOTE — ED Provider Notes (Signed)
Emergency Medicine Provider Triage Evaluation Note  Arbutus Ped , a 65 y.o. female  was evaluated in triage.  Pt complains of ongoing chest pain since January.  She has been told that it was GERD, went to a GI doctor, and they reportedly told her she needed to go back to cardiology.  She states that she is concerned as her pain has been ongoing for many months.  She notes that her pain is worse with any torso movement, she has a hard time getting out of bed is when she moves she has pain..  Review of Systems  Positive: Chest pain,  Negative: Leg swelling, shortness of breath  Physical Exam  BP 125/87 (BP Location: Right Arm)   Pulse 94   Temp 99.5 F (37.5 C) (Oral)   Resp 16   Ht '4\' 11"'$  (1.499 m)   Wt 56.6 kg   SpO2 99%   BMI 25.21 kg/m  Gen:   Awake, no distress   Resp:  Normal effort  MSK:   Moves extremities without difficulty  Other:  Chest pain is exacerbated with any movements  Medical Decision Making  Medically screening exam initiated at 6:34 PM.  Appropriate orders placed.  Charm Barges Lex was informed that the remainder of the evaluation will be completed by another provider, this initial triage assessment does not replace that evaluation, and the importance of remaining in the ED until their evaluation is complete.  Note: Portions of this report may have been transcribed using voice recognition software. Every effort was made to ensure accuracy; however, inadvertent computerized transcription errors may be present    Ollen Gross 03/10/21 1841    Luna Fuse, MD 03/10/21 2257

## 2021-03-10 NOTE — Consult Note (Signed)
Cardiology Admission CONSULT   Patient ID: [name] MRN: [id number]; DOB: 07/12/1956   Admission date: 03/10/2021  PCP:  Krista Frees, MD   [facility] Providers Cardiologist:  Krista Chandler, MD   {   Chief Complaint:  Chest pain  Patient Profile:   [name] is a 65 y.o. female with hx of CAD, former tobacco use, cutaneous lupus, anxiety, depression, HLD, PAD, and DM who is being seen 03/10/2021 for the evaluation of continued chest pain.  Asked by Dr. Almyra [name] to see for chest pain.  History of Present Illness:   [name] has hx as above, with PCI/stent of RCA, then stent to RCA in 2002, and in 2-10 her RCA was subtotally occluded distally resulting in 2 overlapping DES placed in distal RCA.  Total of 3 stents in RCA.  Cath in 2013 with severe stenosis in OM and treated with BMS.  Normal stress test 2016, echo in 2016 with normal LV systolic function.  Her PAD is followed by Dr. Fletcher [name]. On last visit in nov with Dr. Angelena [name] she was stable.   In April 2022 she had her PV follow up with Dr. Fletcher [name] and complained of intermittent chest pain "fullness and tightness" with rest and activity.  She had been seen in ER in Jan 2022 and it was thought to be GERD. She was given protonix but pain has continued.  She did have a nuc study that was neg for ischemia in April.    More recently was seen at urgent care and then [name] for ongoing chest pain, which was thought to be GERD but the protonix has not helped.  She was asked to come to ER for further eval.  She states that she has had constant predominant left-sided chest discomfort for several weeks, states that it feels pressure-like in some instances but more of a sharp pain, pleuritic, also pain when moving her neck and sometimes with moving her left arm.  Nitroglycerin has not been effective.  She was prescribed Pepcid on her most recent urgent care evaluation and this has not been effective.  She thought that  maybe her bra wire was causing her pain.  No rash or evidence of shingles.  No cough or hemoptysis.  She reports compliance with her medications.  Past Medical History:  Diagnosis Date   Anginal pain ([place])    Anxiety    Bence Jones proteinuria 02/29/2012   Random urine "M spike" 7.1% 02/13/12   CAD (coronary artery disease)    Remote PCI to RCA in 2001, stent to the RCA in 2002, with last cath in 2010 with placement of two overlapping drug eluting  stents to the RCA   Cutaneous lupus erythematosus 1999   "discoid"   Depression    Diabetes mellitus without complication ([place])    type 2 dx july 2018   H/O: substance abuse ([place])    Hyperlipidemia    Mass on back    Myocardial infarction [facility])    "not sure; nobody ever tells me" (04/23/2012)   Noncompliance    PVD (peripheral vascular disease) ([place])    MILD    Past Surgical History:  Procedure Laterality Date   ABDOMINAL HYSTERECTOMY  1999   ovaries removed weeks later   [address] February 02 2015   "have had 15 surgeries on my right ACL"   BREAST SURGERY     "removed knot from one side"   CORONARY ANGIOPLASTY WITH STENT  PLACEMENT  2002; 2010; 04/23/2012   "1 + 2 + 1; total of 4"   EXCISIONAL HEMORRHOIDECTOMY  yrs ago   [place]     points to stomach   LEFT HEART CATHETERIZATION WITH CORONARY ANGIOGRAM N/A 04/23/2012   Procedure: LEFT HEART CATHETERIZATION WITH CORONARY ANGIOGRAM;  Surgeon: Burnell Blanks, MD;  Location: [facility] CATH LAB;  Service: Cardiovascular;  Laterality: N/A;   MASS EXCISION N/A 08/29/2017   Procedure: EXCISIONAL BIOPSY OF BACK SOFT TISSUE MASS;  Surgeon: Greer Pickerel, MD;  Location: [facility] ORS;  Service: General;  Laterality: N/A;   TONSILLECTOMY  early teens     Medications Prior to Admission: Prior to Admission medications   Medication Sig Start Date End Date Taking? Authorizing Provider  acetaminophen (TYLENOL) 500 MG tablet Take 1,000 mg by mouth every 6 (six) hours as  needed for mild pain. For pain    [provider]  amoxicillin (AMOXIL) 500 MG capsule TAKE 4 CAPSULES BY MOUTH 1 HOUR BEFORE TO DENTAL APPOINTMENT 12/30/18   [provider]  aspirin 81 MG tablet Take 81 mg by mouth at bedtime.    [provider]  atorvastatin (LIPITOR) 20 MG tablet TAKE 1 TABLET BY MOUTH EVERY DAY 06/21/20   Burnell Blanks, MD  azelastine (ASTELIN) 0.1 % nasal spray Place 2 sprays into both nostrils 2 (two) times daily. Patient not taking: No sig reported 08/13/18   Kennith Gain, MD  Cholecalciferol (HM VITAMIN D3) 100 MCG (4000 UT) CAPS Take by mouth daily.    [provider]  ezetimibe (ZETIA) 10 MG tablet TAKE 1 TABLET BY MOUTH EVERY DAY 09/26/20   Burnell Blanks, MD  famotidine (PEPCID) 20 MG tablet Take 2 tablets (40 mg total) by mouth daily. 03/05/21   Valarie Merino, MD  hydroxychloroquine (PLAQUENIL) 200 MG tablet Take 200 mg by mouth at bedtime.    [provider]  ibuprofen (ADVIL,MOTRIN) 200 MG tablet Take 400 mg by mouth daily as needed for moderate pain.    [provider]  LORazepam (ATIVAN) 0.5 MG tablet as needed.  06/26/18   [provider]  metFORMIN (GLUCOPHAGE-XR) 500 MG 24 hr tablet Take 1,000 mg by mouth daily.    [provider]  metoprolol succinate (TOPROL-XL) 25 MG 24 hr tablet TAKE 1 TABLET BY MOUTH EVERY DAY 06/21/20   Burnell Blanks, MD  Multiple Vitamin (MULTIVITAMIN) tablet Take 1 tablet by mouth daily.    [provider]  nitroGLYCERIN (NITROSTAT) 0.4 MG SL tablet PLACE 1 TABLET (0.4 MG TOTAL) UNDER THE TONGUE EVERY 5 (FIVE) MINUTES AS NEEDED. FOR CHEST PAIN 05/07/19   Burnell Blanks, MD  sertraline (ZOLOFT) 50 MG tablet as needed.  Patient not taking: No sig reported 06/26/18   [provider]  triamcinolone ointment (KENALOG) 0.1 % Apply 1 application topically daily as needed (lupus flares). 06/24/18   [provider]     Allergies:   No Known Allergies  Social History:   Social History   Tobacco Use   Smoking status: Former    Packs/day: 1.50    Years: 38.00    Pack years: 57.00    Types: Cigarettes    Quit date: 04/20/2012    Years since quitting: 8.8   Smokeless tobacco: Never  Substance Use Topics   Alcohol use: Yes    Alcohol/week: 14.0 standard drinks    Types: 14 Cans of beer per week    Comment: beer 2  x week      Family History:   The patient's family history includes Heart disease in her father; Heart failure in her father; Hypertension in her father and mother; Lung cancer in her mother; Stroke in her father.    ROS:  Please see the history of present illness.  General:no colds or fevers, no weight changes Skin:no rashes or ulcers HEENT:no blurred vision, no congestion CV:see HPI PUL:see HPI GI:no diarrhea constipation or melena, no indigestion GU:no hematuria, no dysuria MS:no joint pain, no claudication + PVD Neuro:no syncope, no lightheadedness Endo:+ diabetes, no thyroid disease All other ROS reviewed and negative.     Physical Exam/Data:   Vitals:   03/10/21 1748 03/10/21 1759  BP: 125/87   Pulse: 94   Resp: 16   Temp: 99.5 F (37.5 C)   TempSrc: Oral   SpO2: 99%   Weight:  56.6 kg  Height:  $Remove'4\' 11"'lIfWoIB$  (1.499 m)   No intake or output data in the 24 hours ending 03/10/21 2028 Last 3 Weights 03/10/2021 10/25/2020 10/18/2020  Weight (lbs) 124 lb 12.8 oz 130 lb 130 lb 12.8 oz  Weight (kg) 56.609 kg 58.968 kg 59.33 kg     Body mass index is 25.21 kg/m.  General:  Well nourished, well developed, complains of chest pain when moving in certain positions, trying to sit up or move her left arm. HEENT: normal Lymph: no adenopathy Neck: no JVD Endocrine:  No thryomegaly Vascular: No carotid bruits; FA pulses 2+ bilaterally without bruits  Cardiac:  normal S1, S2; RRR; no murmur, no pericardial rub Lungs:  clear to auscultation bilaterally, no wheezing,  rhonchi or rales  Abd: soft, nontender, no hepatomegaly  Ext: no edema Musculoskeletal:  No deformities, BUE and BLE strength normal and equal Skin: warm and dry  Neuro:  CNs 2-12 intact, no focal abnormalities noted Psych:  Normal affect   Relevant CV Studies:  Myoview April 2022: Nuclear stress EF: 62%. The left ventricular ejection fraction is normal (55-65%). T wave inversion was noted during stress in the V3 and V4 leads, and returning to baseline after less than 1 min of recovery. The study is normal with no evidence of ischemia or infarction. This is a low risk study.  Echo 2021: IMPRESSIONS    1. Left ventricular ejection fraction, by estimation, is 60 to 65%. Left  ventricular ejection fraction by PLAX is 62 %. The left ventricle has  normal function. The left ventricle demonstrates regional wall motion  abnormalities (see scoring  diagram/findings for description). Left ventricular diastolic parameters  are consistent with Grade I diastolic dysfunction (impaired relaxation).   2. Right ventricular systolic function is normal. The right ventricular  size is mildly enlarged. Mildly increased right ventricular wall  thickness.   3. The mitral valve is grossly normal. No evidence of mitral valve  regurgitation.   4. The aortic valve is normal in structure. Aortic valve regurgitation is  not visualized.   Comparison(s): A prior study was performed on 03/09/2015. No significant  change from prior study. Prior images reviewed side by side.   FINDINGS   Left Ventricle: Left ventricular ejection fraction, by estimation, is 60  to 65%. Left ventricular ejection fraction by PLAX is 62 %. The left  ventricle has normal function. The left ventricle demonstrates regional  wall motion abnormalities. The left  ventricular internal cavity size was normal in size. There is no left  ventricular hypertrophy. Left ventricular diastolic parameters are  consistent with Grade I  diastolic  dysfunction (impaired relaxation).   Right Ventricle: The right ventricular size is mildly enlarged. Mildly  increased right ventricular wall thickness. Right ventricular systolic  function is normal.   Left Atrium: Left atrial size was normal in size.   Right Atrium: Right atrial size was normal in size.   Pericardium: There is no evidence of pericardial effusion.   Mitral Valve: The mitral valve is grossly normal. There is mild thickening  of the mitral valve leaflet(s). No evidence of mitral valve regurgitation.   Tricuspid Valve: The tricuspid valve is normal in structure. Tricuspid  valve regurgitation is not demonstrated.   Aortic Valve: The aortic valve is normal in structure. Aortic valve  regurgitation is not visualized.   Pulmonic Valve: The pulmonic valve was grossly normal. Pulmonic valve  regurgitation is not visualized.   Aorta: The aortic root and ascending aorta are structurally normal, with  no evidence of dilitation.   IAS/Shunts: The atrial septum is grossly normal.   Cardiac cath 2013: Angiographic Findings:   Left main: No obstructive disease.    Left Anterior Descending Artery: Large caliber vessel that courses to the apex. There is a 20% stenosis proximally. The diagonal branch is patent.    Intermediate branch: Moderate sized. No disease noted.    Circumflex Artery: Moderate caliber vessel with a long area of 50% stenosis proximally followed by a 99% stenosis leading into the large OM branch. The continuation of the AV groove Circumflex is occluded beyond the marginal branch and fills from right to left collaterals, unchanged from last cath.    Right Coronary Artery: Large, dominant vessel with diffuse 30% stenosis in the mid vessel with patent overlapping stents in the distal vessel. There is minimal in-stent restenosis. The distal AV groove Circumflex is seen to fill from right to left collaterals.    Left Ventricular Angiogram: LVEF=50%     Impression: 1. Double vessel CAD with patent stents RCA and severe stenosis in the mid Circumflex extending into the first OM branch.  2. Low normal LV systolic function 3. Unstable angina 4. Successful PTCA/bare metal stent x 1.    Recommendations: She will need dual antiplatelet with ASA and Plavix for at least one month before her planned surgical procedure. Continue statin/beta blocker. Smoking cessation recommended.        Complications:  None. The patient tolerated the procedure well.                Laboratory Data:  High Sensitivity Troponin:   Recent Labs  Lab 03/05/21 1711 03/05/21 1904 03/10/21 1837  TROPONINIHS $RemoveBefo'4 3 3      '[id number]$ Chemistry Recent Labs  Lab 03/05/21 1711 03/10/21 1837  NA 138 140  K 4.1 4.0  CL 104 106  CO2 26 25  GLUCOSE 122* 115*  BUN 22 14  CREATININE 1.08* 0.79  CALCIUM 9.5 9.8  GFRNONAA 58* >60  ANIONGAP 8 9    Recent Labs  Lab 03/10/21 1837  PROT 7.4  ALBUMIN 4.5  AST 22  ALT 25  ALKPHOS 68  BILITOT 0.6   Hematology Recent Labs  Lab 03/05/21 1711 03/10/21 1837  WBC 5.5 5.6  RBC 3.95 4.43  HGB 11.4* 12.7  HCT 35.9* 40.0  MCV 90.9 90.3  MCH 28.9 28.7  MCHC 31.8 31.8  RDW 14.3 14.4  PLT 329 359    Radiology/Studies:  DG Chest 2 View  Result Date: 03/10/2021 CLINICAL DATA:  Chest pain EXAM: CHEST - 2 VIEW COMPARISON:  03/05/2021  FINDINGS: The heart size and mediastinal contours are within normal limits. Both lungs are clear. Scoliosis of the spine. Aortic atherosclerosis. IMPRESSION: No active cardiopulmonary disease. Electronically Signed   By: Donavan Foil M.D.   On: 03/10/2021 19:10     Assessment and Plan:   Continued chest pain, off and on since Jan 2022, protonix has not helped.  Nuc study in April was normal.   Recently seen at urgent care with normal Troponins but with ongoing pain was instructed to come to ER. EKG with nonspecific inferolateral ST-T wave changes, more prominent than last tracing.  Follow-up  high-sensitivity troponin I again negative despite prolonged symptoms for weeks.  Features are atypical for ischemia despite ECG, chest x-ray without acute findings. CAD with hx of stent to RCA and circumflex. PAD with mild calf claudication.  Noninvasive vascular studies showed an ABI of 0.88 on the right and 0.83 on the left.  Duplex showed moderate right SFA disease with two-vessel runoff below the knee.  On the left, there was borderline significant left SFA disease with three-vessel runoff below the knee- treated medically. HLD on statin last LDL 9 months ago was 54. Continue statin.  GERD continue protonix  DM-2 add SSI and hold Glucophage in prep for cath. Cutaneous lupus. 9 mm groundglass opacity in the right lower lobe, stable in appearance by chest CT in June.   For questions or updates, please contact [place] Please consult [email or web address] for contact info under     Signed, Cecilie Kicks, NP 03/10/2021 8:28 PM    Attending note:  Patient seen and examined.  I reviewed her records and discussed the case with Ms. Ingold NP.  Ms. Tull has a history of CAD status post PCI to the RCA including stent intervention in 2002 and subsequent overlapping DES in 2010 followed most recently by BMS to the obtuse marginal in 2013.  She follows with Dr. Angelena [name].  Per chart review she has had recurring chest discomfort over the last 6 months, underwent a follow-up Myoview in April that was low risk, and most recently was seen in urgent care reporting worsening chest discomfort with normal high-sensitivity troponin I levels and ECG, also no acute findings by chest x-ray.  She had already been prescribed Protonix which she states was not effective, most recently started on Pepcid and referred to see GI.  Call placed to cardiology office today who recommended referral to the ER given continued symptoms.  In discussing the patient's symptoms this evening she describes a feeling of tightness and  also focal sharp discomfort under her left breast, point area on the left side of her chest, also stiffness and pain when moving the left side of her neck and also her left arm.  It was painful for her to change positions on the stretcher, sitting up and moving to her left side.  She states that when she takes a deep breath she gets a sudden sharp pain in the left side of her chest as well.  She has had no cough or hemoptysis, no fevers or chills.  When she did take nitroglycerin it did not help her symptoms.  She does have a history of cutaneous lupus.  Also known 9 mm groundglass opacity in the right lower lobe by chest CT in June.  On examination lungs are clear, no obvious pericardial or pleural rub evident.  Cardiac exam reveals regular rate and rhythm without significant murmur.  She is hemodynamically stable by vital signs, not hypoxic.  Temperature 99.5 degrees.  No peripheral edema.  Pertinent lab work includes potassium 4.0, BUN 14, creatinine 0.79, high-sensitivity troponin I 3, WBC 5.6, hemoglobin 12.7, platelets 359.  Chest x-ray reports no acute process.  ECG personally reviewed and shows sinus rhythm with nonspecific inferolateral ST-T wave abnormalities, more prominent in comparison to the prior tracing, not clearly diagnostic of injury current although not excluding ischemic change either.  Patient presents with left-sided thoracic, cervical, and shoulder discomfort that per description is atypical for ischemia/angina.  She reports several weeks of symptoms and nearly constant pain, despite this has normal high-sensitivity troponin I levels.  Her ECG is abnormal as discussed above but nonspecific.  She does not report any cough or hemoptysis, does have a pleuritic component to her symptoms as well.  No rash or obvious shingles.  Question whether cutaneous lupus has any bearing on an inflammatory etiology.  Does not seem typical for reflux and PPI has not been helpful.  Would recommend  chest CTA for further evaluation, check ESR and CRP, cycle high-sensitivity troponin I level and repeat ECG as well once heart rate has come down some.  If she is ultimately admitted for further evaluation, could also consider an echocardiogram to exclude pericardial effusion unless this is evident by chest CT.  Not clear that cardiac catheterization is indicated at this time.  Continue work-up in ER and if symptoms continue without clear-cut etiology, consider further discussion with internal medicine.  If she is admitted, our service will continue to follow with you.  Satira Sark, M.D., F.A.C.C.

## 2021-03-10 NOTE — Telephone Encounter (Signed)
Patient is following up. She states she is currently at GI specialist's office and they are wanting to refer her back to cardiology. Per patient, they are saying her symptoms are more cardiac related. Please return call to patient to discuss.

## 2021-03-10 NOTE — Telephone Encounter (Signed)
Pt is still continuing to have acid reflux after being discharged from the hospital and pt have been to an Urgent Care. 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

## 2021-03-10 NOTE — ED Provider Notes (Signed)
[facility] EMERGENCY DEPARTMENT Provider Note   CSN: [id number] Arrival date & time: 03/10/21  1735     History Chief Complaint  Patient presents with   Chest Pain    [name] is a 65 y.o. female.  Patient presents with chief complaint of chest pain.  Describes it as a sharp and severe pain initially states is 10 out of 10 but at this time has improved to more moderate level.  She states she had it for several months and had been following up with GI thinking it may be it was a gastrointestinal etiology.  She was advised to follow-up with cardiology by her gastroenterologist for further evaluation.  She states the pain is worse when she moves a certain way otherwise the pain radiates across her chest.  No associated fevers or cough no vomiting or diarrhea.     Past Medical History:  Diagnosis Date   Anginal pain ([place])    Anxiety    Bence Jones proteinuria 02/29/2012   Random urine "M spike" 7.1% 02/13/12   CAD (coronary artery disease)    Remote PCI to RCA in 2001, stent to the RCA in 2002, with last cath in 2010 with placement of two overlapping drug eluting  stents to the RCA   Cutaneous lupus erythematosus 1999   "discoid"   Depression    Diabetes mellitus without complication ([facility])    type 2 dx july 2018   H/O: substance abuse ([place])    Hyperlipidemia    Mass on back    Myocardial infarction [facility])    "not sure; nobody ever tells me" (04/23/2012)   Noncompliance    PVD (peripheral vascular disease) ([place])    MILD    Patient Active Problem List   Diagnosis Date Noted   Hypokalemia 04/24/2012   Unstable angina ([place]) 04/24/2012   Hyperglycemia 04/24/2012   Chest pain 04/23/2012   Intermediate coronary syndrome ([place]) 04/23/2012   Discoid lupus erythematosus 04/09/2012   Single skin nodule 04/09/2012   CAD (coronary artery disease) 05/29/2011   HTN (hypertension) 12/04/2010   Tobacco abuse 12/04/2010   Bruising    IHD (ischemic heart  disease)    Hyperlipidemia    Cutaneous lupus erythematosus    Anxiety     Past Surgical History:  Procedure Laterality Date   ABDOMINAL HYSTERECTOMY  1999   ovaries removed weeks later   [place] February 02 2015   "have had 15 surgeries on my right ACL"   BREAST SURGERY     "removed knot from one side"   [place]  2002; 2010; 04/23/2012   "1 + 2 + 1; total of 4"   EXCISIONAL HEMORRHOIDECTOMY  yrs ago   [place]     points to stomach   LEFT HEART CATHETERIZATION WITH CORONARY ANGIOGRAM N/A 04/23/2012   Procedure: LEFT HEART CATHETERIZATION WITH CORONARY ANGIOGRAM;  Surgeon: Burnell Blanks, MD;  Location: [facility] CATH LAB;  Service: Cardiovascular;  Laterality: N/A;   MASS EXCISION N/A 08/29/2017   Procedure: EXCISIONAL BIOPSY OF BACK SOFT TISSUE MASS;  Surgeon: Greer Pickerel, MD;  Location: [facility] ORS;  Service: General;  Laterality: N/A;   TONSILLECTOMY  early teens     OB History   No obstetric history on file.     Family History  Problem Relation Age of Onset   Hypertension Mother    Lung cancer Mother    Heart disease Father  Hypertension Father    Stroke Father    Heart failure Father     Social History   Tobacco Use   Smoking status: Former    Packs/day: 1.50    Years: 38.00    Pack years: 57.00    Types: Cigarettes    Quit date: 04/20/2012    Years since quitting: 8.8   Smokeless tobacco: Never  Vaping Use   Vaping Use: Never used  Substance Use Topics   Alcohol use: Yes    Alcohol/week: 14.0 standard drinks    Types: 14 Cans of beer per week    Comment: beer 2 x week    Drug use: Yes    Types: Marijuana    Comment: smoked marijuana 09-04-16    Home Medications Prior to Admission medications   Medication Sig Start Date End Date Taking? Authorizing Provider  acetaminophen (TYLENOL) 500 MG tablet Take 1,000 mg by mouth every 6 (six) hours as needed for mild pain. For pain    [provider]  amoxicillin (AMOXIL) 500 MG capsule TAKE 4 CAPSULES BY MOUTH 1 HOUR BEFORE TO DENTAL APPOINTMENT 12/30/18   [provider]  aspirin 81 MG tablet Take 81 mg by mouth at bedtime.    [provider]  atorvastatin (LIPITOR) 20 MG tablet TAKE 1 TABLET BY MOUTH EVERY DAY 06/21/20   Burnell Blanks, MD  azelastine (ASTELIN) 0.1 % nasal spray Place 2 sprays into both nostrils 2 (two) times daily. Patient not taking: No sig reported 08/13/18   Kennith Gain, MD  Cholecalciferol (HM VITAMIN D3) 100 MCG (4000 UT) CAPS Take by mouth daily.    [provider]  ezetimibe (ZETIA) 10 MG tablet TAKE 1 TABLET BY MOUTH EVERY DAY 09/26/20   Burnell Blanks, MD  famotidine (PEPCID) 20 MG tablet Take 2 tablets (40 mg total) by mouth daily. 03/05/21   Valarie Merino, MD  hydroxychloroquine (PLAQUENIL) 200 MG tablet Take 200 mg by mouth at bedtime.    [provider]  ibuprofen (ADVIL,MOTRIN) 200 MG tablet Take 400 mg by mouth daily as needed for moderate pain.    [provider]  LORazepam (ATIVAN) 0.5 MG tablet as needed.  06/26/18   [provider]  metFORMIN (GLUCOPHAGE-XR) 500 MG 24 hr tablet Take 1,000 mg by mouth daily.    [provider]  metoprolol succinate (TOPROL-XL) 25 MG 24 hr tablet TAKE 1 TABLET BY MOUTH EVERY DAY 06/21/20   Burnell Blanks, MD  Multiple Vitamin (MULTIVITAMIN) tablet Take 1 tablet by mouth daily.    [provider]  nitroGLYCERIN (NITROSTAT) 0.4 MG SL tablet PLACE 1 TABLET (0.4 MG TOTAL) UNDER THE TONGUE EVERY 5 (FIVE) MINUTES AS NEEDED. FOR CHEST PAIN 05/07/19   Burnell Blanks, MD  sertraline (ZOLOFT) 50 MG tablet as needed.  Patient not taking: No sig reported 06/26/18   [provider]  triamcinolone ointment (KENALOG) 0.1 % Apply 1 application topically daily as needed (lupus flares). 06/24/18   [provider]    Allergies    Patient  has no known allergies.  Review of Systems   Review of Systems  Constitutional:  Negative for fever.  HENT:  Negative for ear pain.   Eyes:  Negative for pain.  Respiratory:  Negative for cough.   Cardiovascular:  Positive for chest pain.  Gastrointestinal:  Negative for abdominal pain.  Genitourinary:  Negative for flank pain.  Musculoskeletal:  Negative for back pain.  Skin:  Negative for rash.  Neurological:  Negative for headaches.   Physical Exam Updated Vital Signs BP 114/81   Pulse 74   Temp 98.8 F (37.1 C) (Oral)   Resp 15   Ht '4\' 11"'$  (1.499 m)   Wt 56.6 kg   SpO2 99%   BMI 25.21 kg/m   Physical Exam Constitutional:      General: She is not in acute distress.    Appearance: Normal appearance.  HENT:     Head: Normocephalic.     Nose: Nose normal.  Eyes:     Extraocular Movements: Extraocular movements intact.  Cardiovascular:     Rate and Rhythm: Normal rate.  Pulmonary:     Effort: Pulmonary effort is normal.  Musculoskeletal:        General: Normal range of motion.     Cervical back: Normal range of motion.  Neurological:     General: No focal deficit present.     Mental Status: She is alert. Mental status is at baseline.    ED Results / Procedures / Treatments   Labs (all labs ordered are listed, but only abnormal results are displayed) Labs Reviewed  COMPREHENSIVE METABOLIC PANEL - Abnormal; Notable for the following components:      Result Value   Glucose, Bld 115 (*)    All other components within normal limits  SARS CORONAVIRUS 2 BY RT PCR (HOSPITAL ORDER, [facility])  LIPASE, BLOOD  CBC WITH DIFFERENTIAL/PLATELET  TROPONIN I (HIGH SENSITIVITY)  TROPONIN I (HIGH SENSITIVITY)    EKG None  Radiology DG Chest 2 View  Result Date: 03/10/2021 CLINICAL DATA:  Chest pain EXAM: CHEST - 2 VIEW COMPARISON:  03/05/2021 FINDINGS: The heart size and mediastinal contours are within normal limits. Both lungs are clear.  Scoliosis of the spine. Aortic atherosclerosis. IMPRESSION: No active cardiopulmonary disease. Electronically Signed   By: Donavan Foil M.D.   On: 03/10/2021 19:10   CT Angio Chest PE W and/or Wo Contrast  Result Date: 03/10/2021 CLINICAL DATA:  Shortness of breath and chest pain. Concern for pulmonary embolism. EXAM: CT ANGIOGRAPHY CHEST WITH CONTRAST TECHNIQUE: Multidetector CT imaging of the chest was performed using the standard protocol during bolus administration of intravenous contrast. Multiplanar CT image reconstructions and MIPs were obtained to evaluate the vascular anatomy. CONTRAST:  64m OMNIPAQUE IOHEXOL 350 MG/ML SOLN COMPARISON:  Chest radiograph dated 03/10/2021. chest CT dated 12/26/2020. FINDINGS: Cardiovascular: There is no cardiomegaly or pericardial effusion. Coronary vascular stent noted. There is mild atherosclerotic calcification of the thoracic aorta. No aneurysmal dilatation. The origins of the great vessels of the aortic arch appear patent. No pulmonary emboli identified. Mediastinum/Nodes: No hilar or mediastinal adenopathy. The esophagus and the thyroid gland are grossly unremarkable. No mediastinal fluid collection. Lungs/Pleura: Minimal bibasilar dependent atelectasis. No focal consolidation, pleural effusion, or pneumothorax. Similar appearance of a 9 mm ground-glass nodule in the right lower lobe (87/6). As per recommendation of prior CT repeat CT every 2 years recommended until 5 year stability is established. The central airways are patent. Upper Abdomen: No acute abnormality. Musculoskeletal: Scoliosis.  No acute osseous pathology. Review of the MIP images confirms the above findings. IMPRESSION: 1. No acute intrathoracic pathology. No CT evidence of pulmonary embolism. 2. Stable appearance of a 9 mm ground-glass nodule in the right lower lobe. Follow-up 82 year as per recommendation of prior CT until 5 year stability is established. 3. Aortic Atherosclerosis (ICD10-I70.0).  Electronically Signed   By: AMilas Hock  Radparvar M.D.   On: 03/10/2021 22:51    Procedures Procedures   Medications Ordered in ED Medications  morphine 4 MG/ML injection 4 mg (4 mg Intravenous Given 03/10/21 2122)  iohexol (OMNIPAQUE) 350 MG/ML injection 75 mL (75 mLs Intravenous Contrast Given 03/10/21 2242)    ED Course  I have reviewed the triage vital signs and the nursing notes.  Pertinent labs & imaging results that were available during my care of the patient were reviewed by me and considered in my medical decision making (see chart for details).    MDM Rules/Calculators/A&P HEAR Score: 5                         EKG shows sinus rhythm.  No ST elevations or depressions noted.  Normal rate.  Troponins are negative x2.  Labs otherwise unremarkable.  CT angio pursued with no evidence of pulmonary embolism stable lung nodule noted.  Patient was also seen by cardiology here who have cleared her for continued outpatient follow-up.  Given negative work-up today advised outpatient follow-up with her GI and primary care doctors as well as cardiology within the week.  Advised immediate return for worsening symptoms worsening pain or any additional concerns.  Final Clinical Impression(s) / ED Diagnoses Final diagnoses:  Chest pain, unspecified type    Rx / DC Orders ED Discharge Orders     None        Luna Fuse, MD 03/10/21 [phone]

## 2021-03-11 LAB — SARS CORONAVIRUS 2 BY RT PCR (HOSPITAL ORDER, PERFORMED IN ~~LOC~~ HOSPITAL LAB): SARS Coronavirus 2: NEGATIVE

## 2021-03-13 DIAGNOSIS — L930 Discoid lupus erythematosus: Secondary | ICD-10-CM | POA: Diagnosis not present

## 2021-03-17 DIAGNOSIS — K219 Gastro-esophageal reflux disease without esophagitis: Secondary | ICD-10-CM | POA: Diagnosis not present

## 2021-03-17 DIAGNOSIS — R1012 Left upper quadrant pain: Secondary | ICD-10-CM | POA: Diagnosis not present

## 2021-04-07 ENCOUNTER — Other Ambulatory Visit: Payer: Self-pay | Admitting: Cardiovascular Disease

## 2021-04-07 DIAGNOSIS — I2510 Atherosclerotic heart disease of native coronary artery without angina pectoris: Secondary | ICD-10-CM

## 2021-04-07 DIAGNOSIS — I2583 Coronary atherosclerosis due to lipid rich plaque: Secondary | ICD-10-CM

## 2021-05-09 DIAGNOSIS — K219 Gastro-esophageal reflux disease without esophagitis: Secondary | ICD-10-CM | POA: Diagnosis not present

## 2021-05-19 DIAGNOSIS — L298 Other pruritus: Secondary | ICD-10-CM | POA: Diagnosis not present

## 2021-05-19 DIAGNOSIS — L930 Discoid lupus erythematosus: Secondary | ICD-10-CM | POA: Diagnosis not present

## 2021-05-19 DIAGNOSIS — L538 Other specified erythematous conditions: Secondary | ICD-10-CM | POA: Diagnosis not present

## 2021-05-31 DIAGNOSIS — S299XXA Unspecified injury of thorax, initial encounter: Secondary | ICD-10-CM | POA: Diagnosis not present

## 2021-06-05 ENCOUNTER — Telehealth: Payer: Self-pay | Admitting: Cardiovascular Disease

## 2021-06-05 DIAGNOSIS — R5383 Other fatigue: Secondary | ICD-10-CM | POA: Diagnosis not present

## 2021-06-05 DIAGNOSIS — M150 Primary generalized (osteo)arthritis: Secondary | ICD-10-CM | POA: Diagnosis not present

## 2021-06-05 DIAGNOSIS — M2550 Pain in unspecified joint: Secondary | ICD-10-CM | POA: Diagnosis not present

## 2021-06-05 DIAGNOSIS — L930 Discoid lupus erythematosus: Secondary | ICD-10-CM | POA: Diagnosis not present

## 2021-06-05 NOTE — Telephone Encounter (Signed)
Patient is requesting orders for lab work.

## 2021-06-06 NOTE — Telephone Encounter (Signed)
Patient is on waitlist for Dr. Angelena Form.  She wanted to let him know that her pains were related to reflux because she is taking medicine for it and feeling better.  She usually gets lipids/liver and a yearly appointment in November.  She is aware we will contact her with an appointment.

## 2021-06-19 ENCOUNTER — Other Ambulatory Visit: Payer: Self-pay

## 2021-06-19 ENCOUNTER — Encounter: Payer: Self-pay | Admitting: *Deleted

## 2021-06-19 ENCOUNTER — Encounter: Payer: Self-pay | Admitting: Cardiovascular Disease

## 2021-06-19 ENCOUNTER — Ambulatory Visit: Payer: Federal, State, Local not specified - PPO | Admitting: Cardiovascular Disease

## 2021-06-19 VITALS — BP 120/74 | HR 77 | Ht 59.0 in | Wt 130.4 lb

## 2021-06-19 DIAGNOSIS — I739 Peripheral vascular disease, unspecified: Secondary | ICD-10-CM | POA: Diagnosis not present

## 2021-06-19 DIAGNOSIS — I2510 Atherosclerotic heart disease of native coronary artery without angina pectoris: Secondary | ICD-10-CM

## 2021-06-19 DIAGNOSIS — E785 Hyperlipidemia, unspecified: Secondary | ICD-10-CM

## 2021-06-19 MED ORDER — EZETIMIBE 10 MG PO TABS: 10.0000 mg | ORAL_TABLET | Freq: Every day | ORAL | 3 refills | Status: DC

## 2021-06-19 NOTE — Patient Instructions (Signed)
Medication Instructions:  No changes *If you need a refill on your cardiac medications before your next appointment, please call your pharmacy*   Lab Work: Please return for fasting labs (Lipids, LFTs)  If you have labs (blood work) drawn today and your tests are completely normal, you will receive your results only by: [place] (if you have MyChart) OR A paper copy in the mail If you have any lab test that is abnormal or we need to change your treatment, we will call you to review the results.   Testing/Procedures: none   Follow-Up: At [facility], you and your health needs are our priority.  As part of our continuing mission to provide you with exceptional heart care, we have created designated Provider Care Teams.  These Care Teams include your primary Cardiologist (physician) and Advanced Practice Providers (APPs -  Physician Assistants and Nurse Practitioners) who all work together to provide you with the care you need, when you need it.  We recommend signing up for the patient portal called "MyChart".  Sign up information is provided on this After Visit Summary.  MyChart is used to connect with patients for Virtual Visits (Telemedicine).  Patients are able to view lab/test results, encounter notes, upcoming appointments, etc.  Non-urgent messages can be sent to your provider as well.   To learn more about what you can do with MyChart, go to [email or web address].    Your next appointment:   12 month(s)  The format for your next appointment:   In Person  Provider:   Lauree Chandler, MD     Other Instructions

## 2021-06-19 NOTE — Progress Notes (Signed)
Chief Complaint  Patient presents with   Follow-up    CAD    History of Present Illness: 65 yo female with history of CAD, former tobacco abuse, cutaneous lupus, anxiety, depression, HLD, PAD and DM here today for cardiac follow up. She has known CAD with prior PCI of the RCA followed by stenting of the RCA in 2002. Cath in 2010 showed that her RCA was subtotally occluded distally. She then had two overlapping drug eluting stents placed in the distal RCA. She has a total of 3 stents in the RCA. Stress test in 2013 in the setting of chest pain showed possible lateral wall ischemia. Cath October 2013 with severe stenosis in the obtuse marginal treated with a bare metal stent. Stress test August 2016 without ischemia. Echo December 2021 with LVEF=60-65%, no valve disease. Nuclear stress test April 2022 with no evidence of ischemia. Her PAD is followed by in our [place] clinic by Dr. Fletcher Anon.   She is here today for follow up. The patient denies any chest pain, dyspnea, palpitations, lower extremity edema, orthopnea, PND, dizziness, near syncope or syncope. She had some chest pain this spring and this resolved with a PPI.    Primary Care Physician: Shirline Frees, MD  Past Medical History:  Diagnosis Date   Anginal pain ([place])    Anxiety    Bence Jones proteinuria 02/29/2012   Random urine "M spike" 7.1% 02/13/12   CAD (coronary artery disease)    Remote PCI to RCA in 2001, stent to the RCA in 2002, with last cath in 2010 with placement of two overlapping drug eluting  stents to the RCA   Cutaneous lupus erythematosus 1999   "discoid"   Depression    Diabetes mellitus without complication ([place])    type 2 dx july 2018   H/O: substance abuse ([place])    Hyperlipidemia    Mass on back    Myocardial infarction [facility])    "not sure; nobody ever tells me" (04/23/2012)   Noncompliance    PVD (peripheral vascular disease) ([place])    MILD    Past Surgical History:  Procedure Laterality Date   ABDOMINAL  HYSTERECTOMY  1999   ovaries removed weeks later   [place] February 02 2015   "have had 15 surgeries on my right ACL"   BREAST SURGERY     "removed knot from one side"   [place]  2002; 2010; 04/23/2012   "1 + 2 + 1; total of 4"   EXCISIONAL HEMORRHOIDECTOMY  yrs ago   [place]     points to stomach   LEFT HEART CATHETERIZATION WITH CORONARY ANGIOGRAM N/A 04/23/2012   Procedure: [place];  Surgeon: Burnell Blanks, MD;  Location: [facility] CATH LAB;  Service: Cardiovascular;  Laterality: N/A;   MASS EXCISION N/A 08/29/2017   Procedure: EXCISIONAL BIOPSY OF BACK SOFT TISSUE MASS;  Surgeon: Greer Pickerel, MD;  Location: [facility] ORS;  Service: General;  Laterality: N/A;   TONSILLECTOMY  early teens    Current Outpatient Medications  Medication Sig Dispense Refill   acetaminophen (TYLENOL) 500 MG tablet Take 1,000 mg by mouth every 6 (six) hours as needed for mild pain. For pain     amoxicillin (AMOXIL) 500 MG capsule TAKE 4 CAPSULES BY MOUTH 1 HOUR BEFORE TO DENTAL APPOINTMENT     aspirin 81 MG tablet Take 81 mg by mouth at bedtime.  atorvastatin (LIPITOR) 20 MG tablet Take 1 tablet (20 mg total) by mouth daily. Please make yearly appt with Dr. Angelena Form for November 2022 for future refills. Thank you 1st attempt 90 tablet 0   azelastine (ASTELIN) 0.1 % nasal spray Place 2 sprays into both nostrils 2 (two) times daily. 30 mL 5   Cholecalciferol (HM VITAMIN D3) 100 MCG (4000 UT) CAPS Take by mouth daily.     hydroxychloroquine (PLAQUENIL) 200 MG tablet Take 200 mg by mouth at bedtime.     ibuprofen (ADVIL,MOTRIN) 200 MG tablet Take 400 mg by mouth daily as needed for moderate pain.     LORazepam (ATIVAN) 0.5 MG tablet as needed.      metFORMIN (GLUCOPHAGE-XR) 500 MG 24 hr tablet Take 1,000 mg by mouth daily.     metoprolol succinate (TOPROL-XL) 25 MG 24 hr tablet TAKE 1 TABLET BY MOUTH EVERY  DAY 90 tablet 3   Multiple Vitamin (MULTIVITAMIN) tablet Take 1 tablet by mouth daily.     nitroGLYCERIN (NITROSTAT) 0.4 MG SL tablet PLACE 1 TABLET (0.4 MG TOTAL) UNDER THE TONGUE EVERY 5 (FIVE) MINUTES AS NEEDED. FOR CHEST PAIN 25 tablet 3   triamcinolone ointment (KENALOG) 0.1 % Apply 1 application topically daily as needed (lupus flares).     ezetimibe (ZETIA) 10 MG tablet Take 1 tablet (10 mg total) by mouth daily. 90 tablet 3   No current facility-administered medications for this visit.    Allergies  Allergen Reactions   Chantix [Varenicline] Other (See Comments)    Social History   Socioeconomic History   Marital status: Single    Spouse name: Not on file   Number of children: Not on file   Years of education: Not on file   Highest education level: Not on file  Occupational History   Not on file  Tobacco Use   Smoking status: Former    Packs/day: 1.50    Years: 38.00    Pack years: 57.00    Types: Cigarettes    Quit date: 04/20/2012    Years since quitting: 9.1   Smokeless tobacco: Never  Vaping Use   Vaping Use: Never used  Substance and Sexual Activity   Alcohol use: Yes    Alcohol/week: 14.0 standard drinks    Types: 14 Cans of beer per week    Comment: beer 2 x week    Drug use: Yes    Types: Marijuana    Comment: smoked marijuana 09-04-16   Sexual activity: Never  Other Topics Concern   Not on file  Social History Narrative   Not on file   Social Determinants of Health   Financial Resource Strain: Not on file  Food Insecurity: Not on file  Transportation Needs: Not on file  Physical Activity: Not on file  Stress: Not on file  Social Connections: Not on file  Intimate Partner Violence: Not on file    Family History  Problem Relation Age of Onset   Hypertension Mother    Lung cancer Mother    Heart disease Father    Hypertension Father    Stroke Father    Heart failure Father     Review of Systems:  As stated in the HPI and otherwise  negative.   BP 120/74 (BP Location: Left Arm, Patient Position: Sitting, Cuff Size: Normal)   Pulse 77   Ht 4\' 11"  (1.499 m)   Wt 130 lb 6.4 oz (59.1 kg)   SpO2 98%   BMI 26.34 kg/m  Physical Examination:  General: Well developed, well nourished, NAD  HEENT: OP clear, mucus membranes moist  SKIN: warm, dry. No rashes. Neuro: No focal deficits  Musculoskeletal: Muscle strength 5/5 all ext  Psychiatric: Mood and affect normal  Neck: No JVD, no carotid bruits, no thyromegaly, no lymphadenopathy.  Lungs:Clear bilaterally, no wheezes, rhonci, crackles Cardiovascular: Regular rate and rhythm. No murmurs, gallops or rubs. Abdomen:Soft. Bowel sounds present. Non-tender.  Extremities: No lower extremity edema. Pulses are 2 + in the bilateral DP/PT.  Echo 06/21/20:  1. Left ventricular ejection fraction, by estimation, is 60 to 65%. Left  ventricular ejection fraction by PLAX is 62 %. The left ventricle has  normal function. The left ventricle demonstrates regional wall motion  abnormalities (see scoring  diagram/findings for description). Left ventricular diastolic parameters  are consistent with Grade I diastolic dysfunction (impaired relaxation).   2. Right ventricular systolic function is normal. The right ventricular  size is mildly enlarged. Mildly increased right ventricular wall  thickness.   3. The mitral valve is grossly normal. No evidence of mitral valve  regurgitation.   4. The aortic valve is normal in structure. Aortic valve regurgitation is  not visualized.   Cardiac cath 04/23/12:  Left main: No obstructive disease.  Left Anterior Descending Artery: Large caliber vessel that courses to the apex. There is a 20% stenosis proximally. The diagonal branch is patent.  Intermediate branch: Moderate sized. No disease noted.  Circumflex Artery: Moderate caliber vessel with a long area of 50% stenosis proximally followed by a 99% stenosis leading into the large OM branch. The  continuation of the AV groove Circumflex is occluded beyond the marginal branch and fills from right to left collaterals, unchanged from last cath.  Right Coronary Artery: Large, dominant vessel with diffuse 30% stenosis in the mid vessel with patent overlapping stents in the distal vessel. There is minimal in-stent restenosis. The distal AV groove Circumflex is seen to fill from right to left collaterals.  Left Ventricular Angiogram: LVEF=50%  Impression:  1. Double vessel CAD with patent stents RCA and severe stenosis in the mid Circumflex extending into the first OM branch.  2. Low normal LV systolic function  3. Unstable angina  4. Successful PTCA/bare metal stent x 1.  EKG:  EKG is not ordered today. The ekg ordered today demonstrates    Recent Labs: 03/10/2021: ALT 25; BUN 14; Creatinine, Ser 0.79; Hemoglobin 12.7; Platelets 359; Potassium 4.0; Sodium 140   Lipid Panel    Component Value Date/Time   CHOL 107 05/30/2020 0841   TRIG 66 05/30/2020 0841   HDL 39 (L) 05/30/2020 0841   CHOLHDL 2.7 05/30/2020 0841   CHOLHDL 3.6 06/04/2016 0811   VLDL 14 06/04/2016 0811   LDLCALC 54 05/30/2020 0841     Wt Readings from Last 3 Encounters:  06/19/21 130 lb 6.4 oz (59.1 kg)  03/10/21 124 lb 12.8 oz (56.6 kg)  10/25/20 130 lb (59 kg)     Other studies Reviewed: Additional studies/ records that were reviewed today include: Review of the above records demonstrates:   Assessment and Plan:   1. CAD without angina: No chest pain suggestive of angina. Echo December 2021 with normal LV function. Nuclear stress test in April 2022 with no ischemia. Will continue ASA, statin and beta blocker. Her chest pain was felt to be due to GERD recently and resolved on PPI.   2. Tobacco abuse, in remission: She no longer smokes  3. Hyperlipidemia: LDL at goal in  November 2021. Will continue statin and Zetia and will repeat lipids and LFTs now.    4. PAD: Followed in the [facility] by Dr. Fletcher Anon.    Current medicines are reviewed at length with the patient today.  The patient does not have concerns regarding medicines.  The following changes have been made:  no change  Labs/ tests ordered today include:   Orders Placed This Encounter  Procedures   Lipid panel   Hepatic function panel     Disposition:   FU with me in 12 months  Signed, Lauree Chandler, MD 06/19/2021 3:46 PM    [facility] [place], [place], [place]  [address] Phone: [phone]; Fax: [phone]

## 2021-06-20 ENCOUNTER — Other Ambulatory Visit: Payer: Federal, State, Local not specified - PPO | Admitting: *Deleted

## 2021-06-20 DIAGNOSIS — I2510 Atherosclerotic heart disease of native coronary artery without angina pectoris: Secondary | ICD-10-CM | POA: Diagnosis not present

## 2021-06-20 DIAGNOSIS — I739 Peripheral vascular disease, unspecified: Secondary | ICD-10-CM

## 2021-06-20 DIAGNOSIS — E785 Hyperlipidemia, unspecified: Secondary | ICD-10-CM

## 2021-06-20 LAB — HEPATIC FUNCTION PANEL
ALT: 20 IU/L (ref 0–32)
AST: 21 IU/L (ref 0–40)
Albumin: 4.6 g/dL (ref 3.8–4.8)
Alkaline Phosphatase: 76 IU/L (ref 44–121)
Bilirubin Total: 0.2 mg/dL (ref 0.0–1.2)
Bilirubin, Direct: <0.1 mg/dL (ref 0.00–0.40)
Total Protein: 6.3 g/dL (ref 6.0–8.5)

## 2021-06-20 LAB — LIPID PANEL
Chol/HDL Ratio: 3 ratio (ref 0.0–4.4)
Cholesterol, Total: 110 mg/dL (ref 100–199)
HDL: 37 mg/dL — ABNORMAL LOW (ref >39)
LDL Chol Calc (NIH): 58 mg/dL (ref 0–99)
Triglycerides: 74 mg/dL (ref 0–149)
VLDL Cholesterol Cal: 15 mg/dL (ref 5–40)

## 2021-06-23 ENCOUNTER — Other Ambulatory Visit: Payer: Self-pay | Admitting: Cardiovascular Disease

## 2021-06-23 DIAGNOSIS — I10 Essential (primary) hypertension: Secondary | ICD-10-CM

## 2021-07-03 DIAGNOSIS — L930 Discoid lupus erythematosus: Secondary | ICD-10-CM | POA: Diagnosis not present

## 2021-08-10 DIAGNOSIS — N760 Acute vaginitis: Secondary | ICD-10-CM | POA: Diagnosis not present

## 2021-08-10 DIAGNOSIS — R399 Unspecified symptoms and signs involving the genitourinary system: Secondary | ICD-10-CM | POA: Diagnosis not present

## 2021-09-06 DIAGNOSIS — M25511 Pain in right shoulder: Secondary | ICD-10-CM | POA: Diagnosis not present

## 2021-10-30 DIAGNOSIS — Z1231 Encounter for screening mammogram for malignant neoplasm of breast: Secondary | ICD-10-CM | POA: Diagnosis not present

## 2021-10-30 DIAGNOSIS — Z01419 Encounter for gynecological examination (general) (routine) without abnormal findings: Secondary | ICD-10-CM | POA: Diagnosis not present

## 2021-11-14 ENCOUNTER — Ambulatory Visit: Payer: Federal, State, Local not specified - PPO | Admitting: Cardiovascular Disease

## 2021-11-22 DIAGNOSIS — H40013 Open angle with borderline findings, low risk, bilateral: Secondary | ICD-10-CM | POA: Diagnosis not present

## 2021-11-22 DIAGNOSIS — Z79899 Other long term (current) drug therapy: Secondary | ICD-10-CM | POA: Diagnosis not present

## 2021-11-22 DIAGNOSIS — E119 Type 2 diabetes mellitus without complications: Secondary | ICD-10-CM | POA: Diagnosis not present

## 2021-11-22 DIAGNOSIS — M329 Systemic lupus erythematosus, unspecified: Secondary | ICD-10-CM | POA: Diagnosis not present

## 2021-12-04 DIAGNOSIS — L930 Discoid lupus erythematosus: Secondary | ICD-10-CM | POA: Diagnosis not present

## 2021-12-04 DIAGNOSIS — Z79899 Other long term (current) drug therapy: Secondary | ICD-10-CM | POA: Diagnosis not present

## 2021-12-04 DIAGNOSIS — M1991 Primary osteoarthritis, unspecified site: Secondary | ICD-10-CM | POA: Diagnosis not present

## 2021-12-04 NOTE — Progress Notes (Unsigned)
Cardiology Office Note   Date:  12/05/2021   ID:  [name], DOB Oct 03, 1955, MRN [id number]  PCP:  Shirline Frees, MD  Cardiologist: Dr. Angelena Form  No chief complaint on file.     History of Present Illness: [name] is a 66 y.o. female who is here today for follow-up visit regarding  peripheral arterial disease.  She has known history of coronary artery disease, previous tobacco use, hyperlipidemia, diabetes, percutaneous lupus and peripheral arterial disease. She was seen in 2020 for bilateral calf claudication which happened after walking half a mile. Noninvasive vascular studies showed an ABI of 0.88 on the right and 0.83 on the left.  Duplex showed moderate right SFA disease with two-vessel runoff below the knee.  On the left, there was borderline significant left SFA disease with three-vessel runoff below the knee. Given that her symptoms were not lifestyle limiting, I recommended a walking program and treatment of risk factors.   She has been doing well and denies calf claudication.  She is bothered by right knee discomfort.  She had chest pain last year that was thought to be due to GERD.  Lexiscan Myoview showed no evidence of ischemia with normal ejection fraction.   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"   Depression    Diabetes mellitus without complication ([place])    type 2 dx july 2018   H/O: substance abuse ([place])    Hyperlipidemia    Mass on back    Myocardial infarction [facility])    "not sure; nobody ever tells me" (04/23/2012)   Noncompliance    PVD (peripheral vascular disease) ([place])    MILD    Past Surgical History:   Procedure Laterality Date   ABDOMINAL HYSTERECTOMY  1999   ovaries removed weeks later   [place] February 02 2015   "have had 15 surgeries on my right ACL"   BREAST SURGERY     "removed knot from one side"   [place]  2002; 2010; 04/23/2012   "1 + 2 + 1; total of 4"   EXCISIONAL HEMORRHOIDECTOMY  yrs ago   [place]     points to stomach   LEFT HEART CATHETERIZATION WITH CORONARY ANGIOGRAM N/A 04/23/2012   Procedure: [place];  Surgeon: Burnell Blanks, MD;  Location: [facility] C[facility] LAB;  Service: Cardiovascular;  Laterality: N/A;   MASS EXCISION N/A 08/29/2017   Procedure: EXCISIONAL BIOPSY OF BACK SOFT TISSUE MASS;  Surgeon: Greer Pickerel, MD;  Location: [facility] ORS;  Service: General;  Laterality: N/A;   TONSILLECTOMY  early teens     Current Outpatient Medications  Medication Sig Dispense Refill   acetaminophen (TYLENOL) 500 MG tablet Take 1,000 mg by mouth every 6 (six) hours as needed for mild pain. For pain     amoxicillin (AMOXIL) 500 MG capsule TAKE 4 CAPSULES BY MOUTH 1 HOUR BEFORE TO DENTAL APPOINTMENT     aspirin 81 MG tablet Take 81 mg by mouth at bedtime.     atorvastatin (  LIPITOR) 20 MG tablet Take 1 tablet (20 mg total) by mouth daily. Please make yearly appt with Dr. Angelena Form for November 2022 for future refills. Thank you 1st attempt 90 tablet 0   Cholecalciferol (HM VITAMIN D3) 100 MCG (4000 UT) CAPS Take by mouth daily.     ezetimibe (ZETIA) 10 MG tablet Take 1 tablet (10 mg total) by mouth daily. 90 tablet 3   hydroxychloroquine (PLAQUENIL) 200 MG tablet Take 200 mg by mouth at bedtime.     ibuprofen (ADVIL,MOTRIN) 200 MG tablet Take 400 mg by mouth daily as needed for moderate pain.     LORazepam (ATIVAN) 0.5 MG tablet as needed.      metFORMIN (GLUCOPHAGE-XR) 500 MG 24 hr tablet Take 1,000 mg by mouth daily.     metoprolol succinate (TOPROL-XL) 25 MG 24 hr tablet  TAKE 1 TABLET BY MOUTH EVERY DAY 90 tablet 3   Multiple Vitamin (MULTIVITAMIN) tablet Take 1 tablet by mouth daily.     nitroGLYCERIN (NITROSTAT) 0.4 MG SL tablet PLACE 1 TABLET (0.4 MG TOTAL) UNDER THE TONGUE EVERY 5 (FIVE) MINUTES AS NEEDED. FOR CHEST PAIN 25 tablet 3   triamcinolone ointment (KENALOG) 0.1 % Apply 1 application topically daily as needed (lupus flares).     No current facility-administered medications for this visit.    Allergies:   Chantix [varenicline]    Social History:  The patient  reports that she quit smoking about 9 years ago. Her smoking use included cigarettes. She has a 57.00 pack-year smoking history. She has never used smokeless tobacco. She reports current alcohol use of about 14.0 standard drinks per week. She reports current drug use. Drug: Marijuana.   Family History:  The patient's family history includes Heart disease in her father; Heart failure in her father; Hypertension in her father and mother; Lung cancer in her mother; Stroke in her father.    ROS:  Please see the history of present illness.   Otherwise, review of systems are positive for none.   All other systems are reviewed and negative.    PHYSICAL EXAM: VS:  BP 112/70   Pulse 68   Ht '4\' 11"'$  (1.499 m)   Wt 129 lb (58.5 kg)   SpO2 95%   BMI 26.05 kg/m  , BMI Body mass index is 26.05 kg/m. GEN: Well nourished, well developed, in no acute distress  HEENT: normal  Neck: no JVD, carotid bruits, or masses Cardiac: RRR; no  rubs, or gallops,no edema .  1/ 6 systolic murmur in the aortic area Respiratory:  clear to auscultation bilaterally, normal work of breathing GI: soft, nontender, nondistended, + BS MS: no deformity or atrophy  Skin: warm and dry, no rash Neuro:  Strength and sensation are intact Psych: euthymic mood, full affect Vascular:  Distal pulses are not palpable.   EKG:  EKG is ordered today. I reviewed her EKG which showed normal sinus rhythm with no significant ST or T  wave changes.   Recent Labs: 03/10/2021: BUN 14; Creatinine, Ser 0.79; Hemoglobin 12.7; Platelets 359; Potassium 4.0; Sodium 140 06/20/2021: ALT 20    Lipid Panel    Component Value Date/Time   CHOL 110 06/20/2021 0730   TRIG 74 06/20/2021 0730   HDL 37 (L) 06/20/2021 0730   CHOLHDL 3.0 06/20/2021 0730   CHOLHDL 3.6 06/04/2016 0811   VLDL 14 06/04/2016 0811   LDLCALC 58 06/20/2021 0730      Wt Readings from Last 3 Encounters:  12/05/21 129 lb (  58.5 kg)  06/19/21 130 lb 6.4 oz (59.1 kg)  03/10/21 124 lb 12.8 oz (56.6 kg)           View : No data to display.            ASSESSMENT AND PLAN:  1.  Peripheral arterial disease: She denies calf claudication at the present time but she is not walking as much as before due to knee arthritis.  I recommend continuing medical therapy and I encouraged her to resume a walking program.    2.  Coronary artery disease involving native coronary arteries without angina: She had chest pain last year that was thought to be due to GERD and resolved with the PPI.  Lexiscan Myoview showed no evidence of ischemia with normal ejection fraction.    3.  Essential hypertension: Blood pressure is well controlled on current medications.  4.  Hyperlipidemia: Continue treatment with atorvastatin and Zetia.  I reviewed most recent lipid profile done in December which showed an LDL of 58 and triglyceride of 74.  Both at target.   Disposition:   FU with me in 12 months  Signed,  Kathlyn [place], MD  12/05/2021 8:14 AM    Sarah Ann

## 2021-12-05 ENCOUNTER — Encounter: Payer: Self-pay | Admitting: Cardiovascular Disease

## 2021-12-05 ENCOUNTER — Ambulatory Visit: Payer: Federal, State, Local not specified - PPO | Admitting: Cardiovascular Disease

## 2021-12-05 VITALS — BP 112/70 | HR 68 | Ht 59.0 in | Wt 129.0 lb

## 2021-12-05 DIAGNOSIS — I739 Peripheral vascular disease, unspecified: Secondary | ICD-10-CM | POA: Diagnosis not present

## 2021-12-05 DIAGNOSIS — E785 Hyperlipidemia, unspecified: Secondary | ICD-10-CM | POA: Diagnosis not present

## 2021-12-05 DIAGNOSIS — I10 Essential (primary) hypertension: Secondary | ICD-10-CM

## 2021-12-05 DIAGNOSIS — I25118 Atherosclerotic heart disease of native coronary artery with other forms of angina pectoris: Secondary | ICD-10-CM | POA: Diagnosis not present

## 2021-12-05 NOTE — Patient Instructions (Signed)
Medication Instructions:  ?No changes ?*If you need a refill on your cardiac medications before your next appointment, please call your pharmacy* ? ? ?Lab Work: ?None ordered ?If you have labs (blood work) drawn today and your tests are completely normal, you will receive your results only by: ?MyChart Message (if you have MyChart) OR ?A paper copy in the mail ?If you have any lab test that is abnormal or we need to change your treatment, we will call you to review the results. ? ? ?Testing/Procedures: ?None ordered ? ? ?Follow-Up: ?At [facility], you and your health needs are our priority.  As part of our continuing mission to provide you with exceptional heart care, we have created designated Provider Care Teams.  These Care Teams include your primary Cardiologist (physician) and Advanced Practice Providers (APPs -  Physician Assistants and Nurse Practitioners) who all work together to provide you with the care you need, when you need it. ? ?We recommend signing up for the patient portal called "MyChart".  Sign up information is provided on this After Visit Summary.  MyChart is used to connect with patients for Virtual Visits (Telemedicine).  Patients are able to view lab/test results, encounter notes, upcoming appointments, etc.  Non-urgent messages can be sent to your provider as well.   ?To learn more about what you can do with MyChart, go to [email or web address].   ? ?Your next appointment:   ?12 month(s) ? ?The format for your next appointment:   ?In Person ? ?Provider:   ?Dr. Arida ? ?Important Information About Sugar ? ? ? ? ? ? ?

## 2021-12-12 IMAGING — CT CT ANGIO CHEST
2 of 7 series · 19 of 46 positions shown · IV contrast (APPLIED)
Comparison: Chest radiograph dated 03/10/2021. chest CT dated
12/26/2020.

CLINICAL DATA: Shortness of breath and chest pain. Concern for
pulmonary embolism.

EXAM:
CT ANGIOGRAPHY CHEST WITH CONTRAST
TECHNIQUE: Multidetector CT imaging of the chest was performed using the
standard protocol during bolus administration of intravenous
contrast. Multiplanar CT image reconstructions and MIPs were
obtained to evaluate the vascular anatomy.
CONTRAST:  75mL OMNIPAQUE IOHEXOL 350 MG/ML SOLN

[Series 7: thins · axial · 0.81mm/px · z∈[+1058,+1327]mm · 16 of 434 slices shown]
[im 25/434  lung]
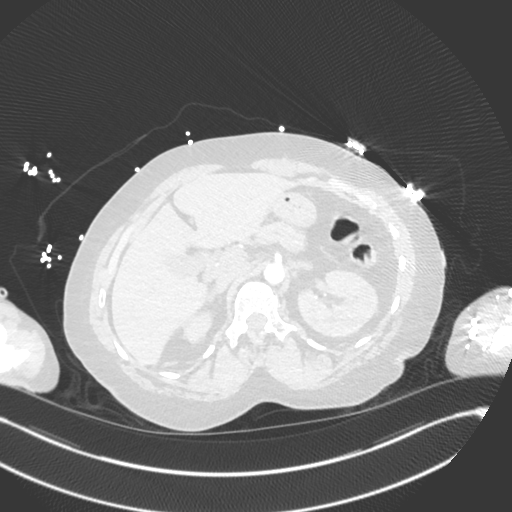
[im 49/434  soft-tissue]
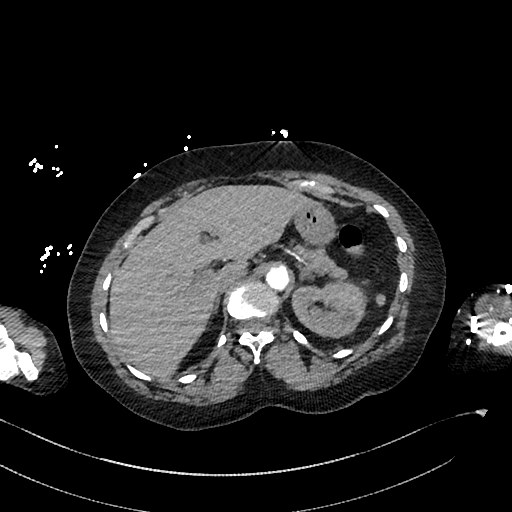
[im 73/434  lung]
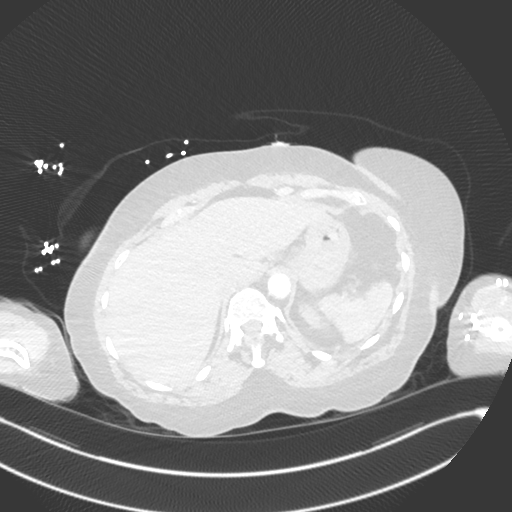
[im 97/434  soft-tissue]
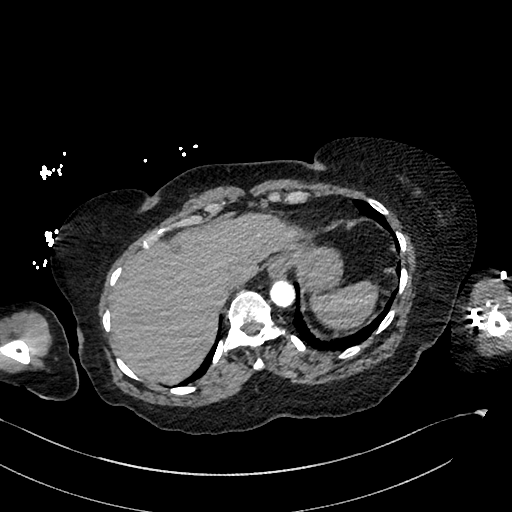
[im 121/434  lung]
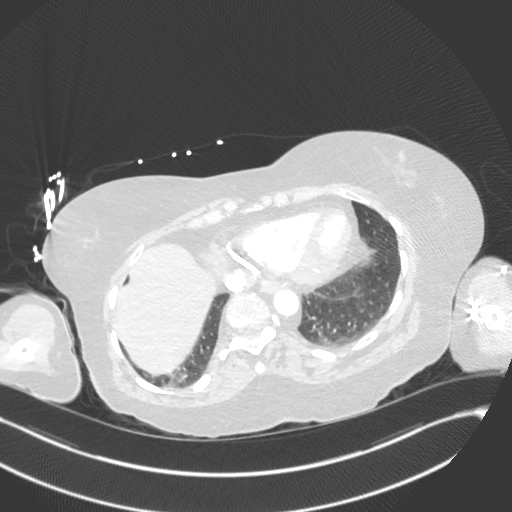
[im 145/434  soft-tissue]
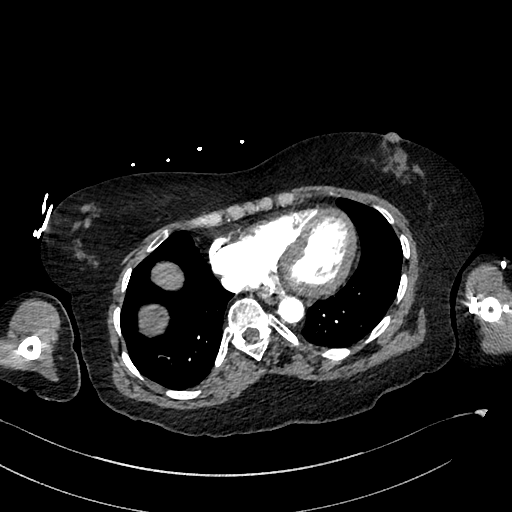
[im 169/434  lung]
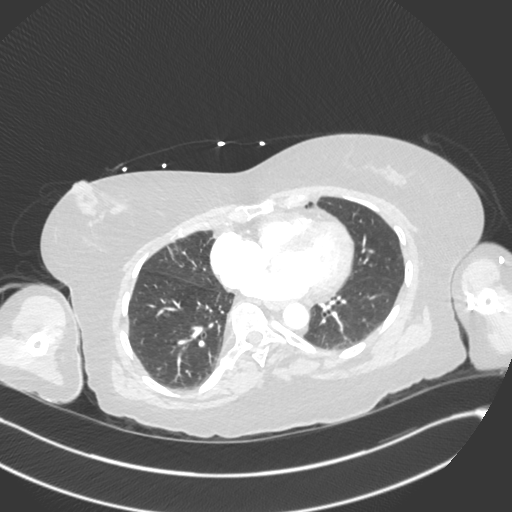
[im 193/434  soft-tissue]
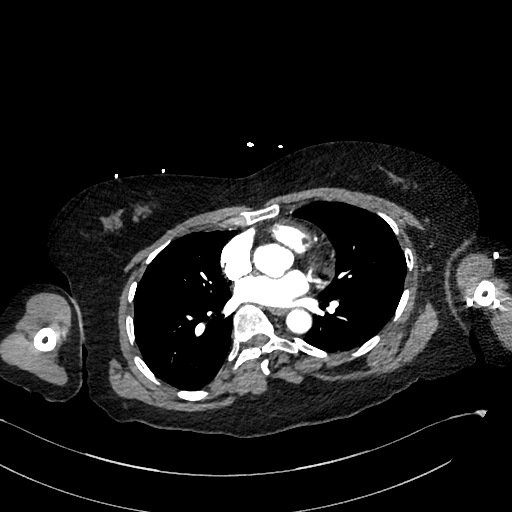
[im 241/434  lung]
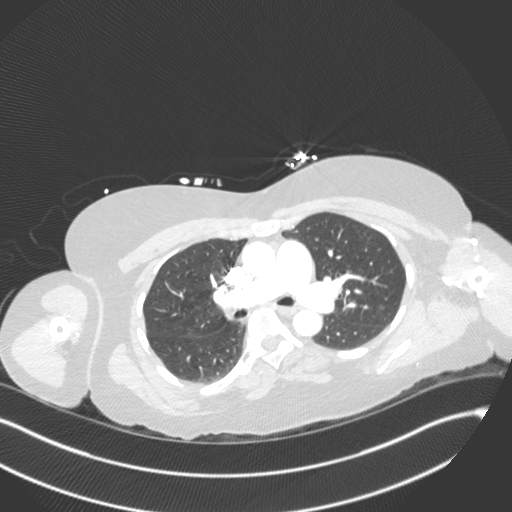
[im 265/434  soft-tissue]
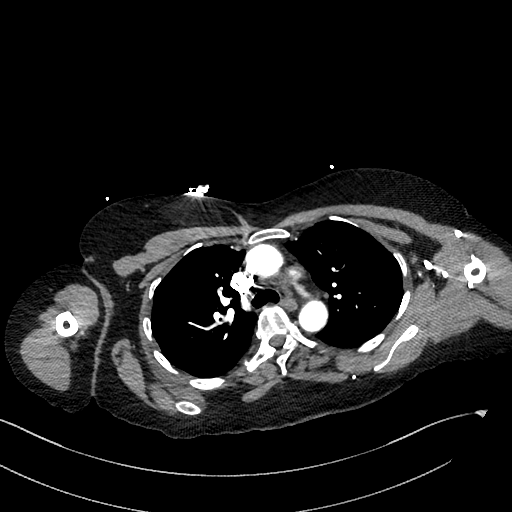
[im 289/434  lung]
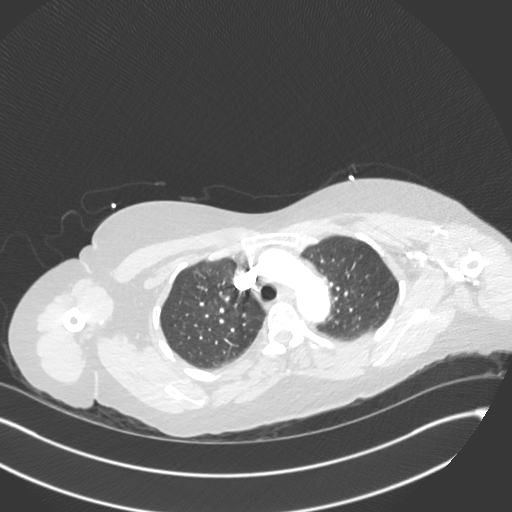
[im 313/434  soft-tissue]
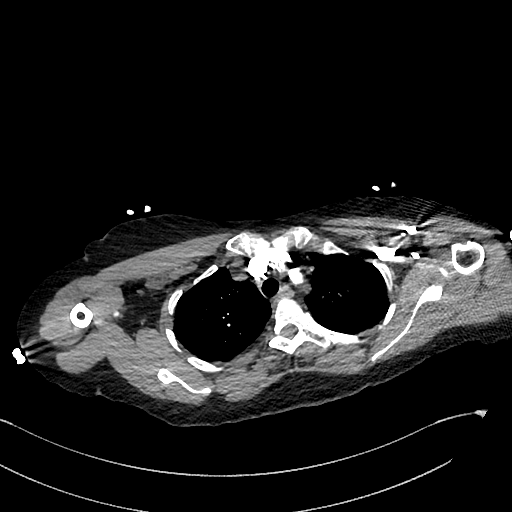
[im 337/434  lung]
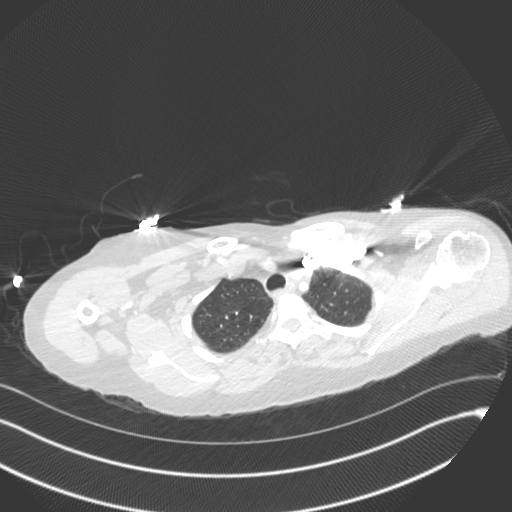
[im 361/434  soft-tissue]
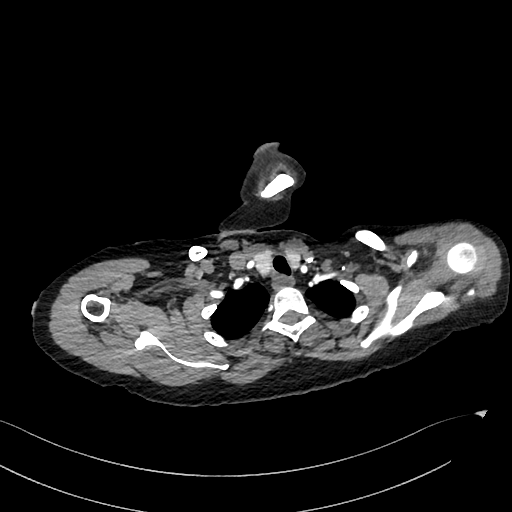
[im 385/434  lung]
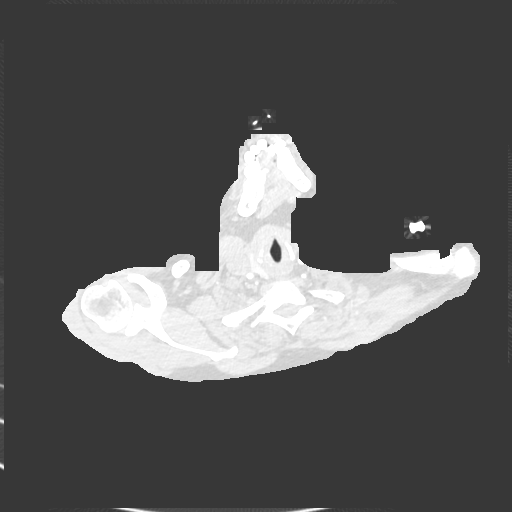
[im 409/434  soft-tissue]
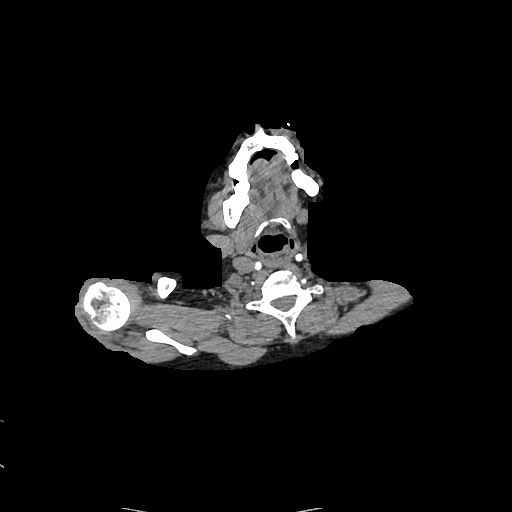

[Series 8: cor · coronal · 0.62mm/px · 3 of 115 slices shown]
[im 29/115  soft-tissue]
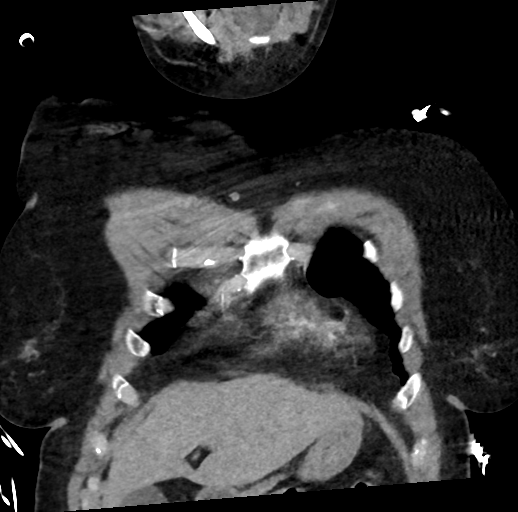
[im 58/115  soft-tissue]
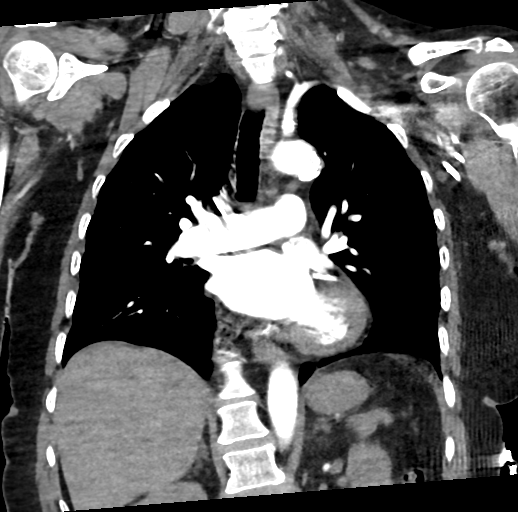
[im 86/115  soft-tissue]
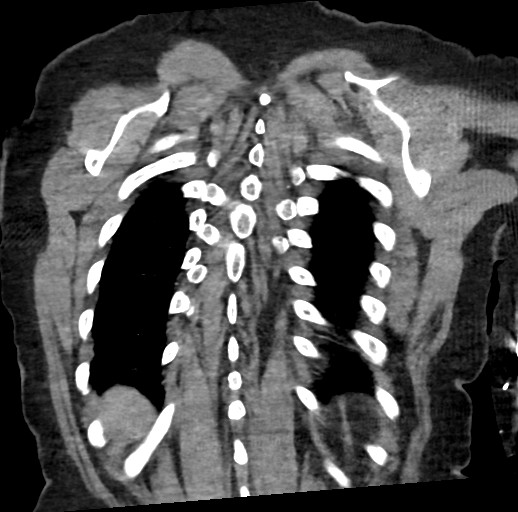

[19 of 46 positions shown; findings below may reference images not displayed]

FINDINGS: Cardiovascular: There is no cardiomegaly or pericardial effusion.
Coronary vascular stent noted. There is mild atherosclerotic
calcification of the thoracic aorta. No aneurysmal dilatation. The
origins of the great vessels of the aortic arch appear patent. No
pulmonary emboli identified.

Mediastinum/Nodes: No hilar or mediastinal adenopathy. The esophagus
and the thyroid gland are grossly unremarkable. No mediastinal fluid
collection.

Lungs/Pleura: Minimal bibasilar dependent atelectasis. No focal
consolidation, pleural effusion, or pneumothorax. Similar appearance
of a 9 mm ground-glass nodule in the right lower lobe (87/6). As per
recommendation of prior CT repeat CT every 2 years recommended until
5 year stability is established. The central airways are patent.

Upper Abdomen: No acute abnormality.

Musculoskeletal: Scoliosis.  No acute osseous pathology.

Review of the MIP images confirms the above findings.
IMPRESSION: 1. No acute intrathoracic pathology. No CT evidence of pulmonary
embolism.
2. Stable appearance of a 9 mm ground-glass nodule in the right
lower lobe. Follow-up 82 year as per recommendation of prior CT
until 5 year stability is established.
3. Aortic Atherosclerosis (EWNHR-3ML.L).

## 2021-12-12 IMAGING — CR DG CHEST 2V
2 series · 2 of 2 positions shown · non-contrast
Comparison: 03/05/2021

CLINICAL DATA: Chest pain

EXAM:
CHEST - 2 VIEW

[chest pa]
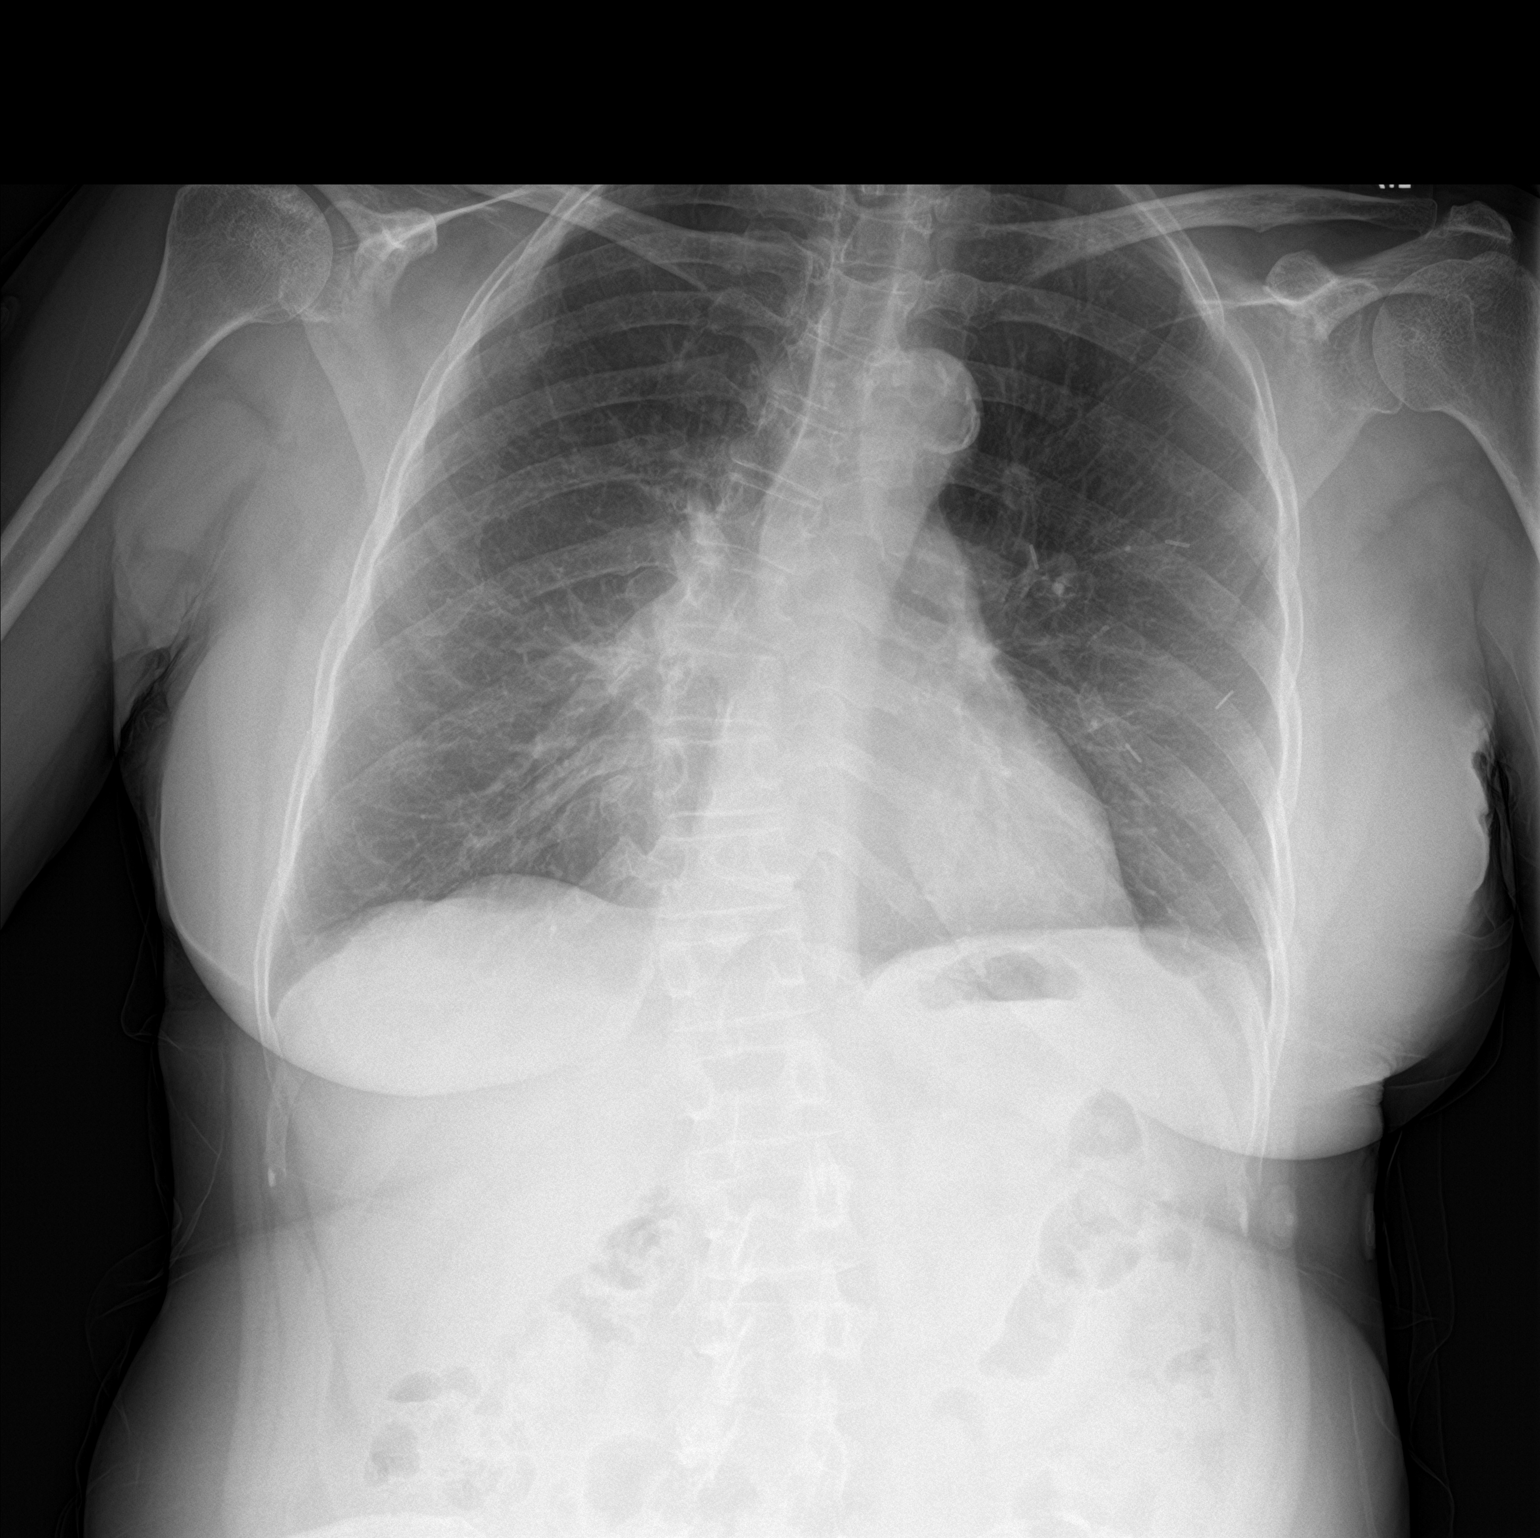

[chest lat]
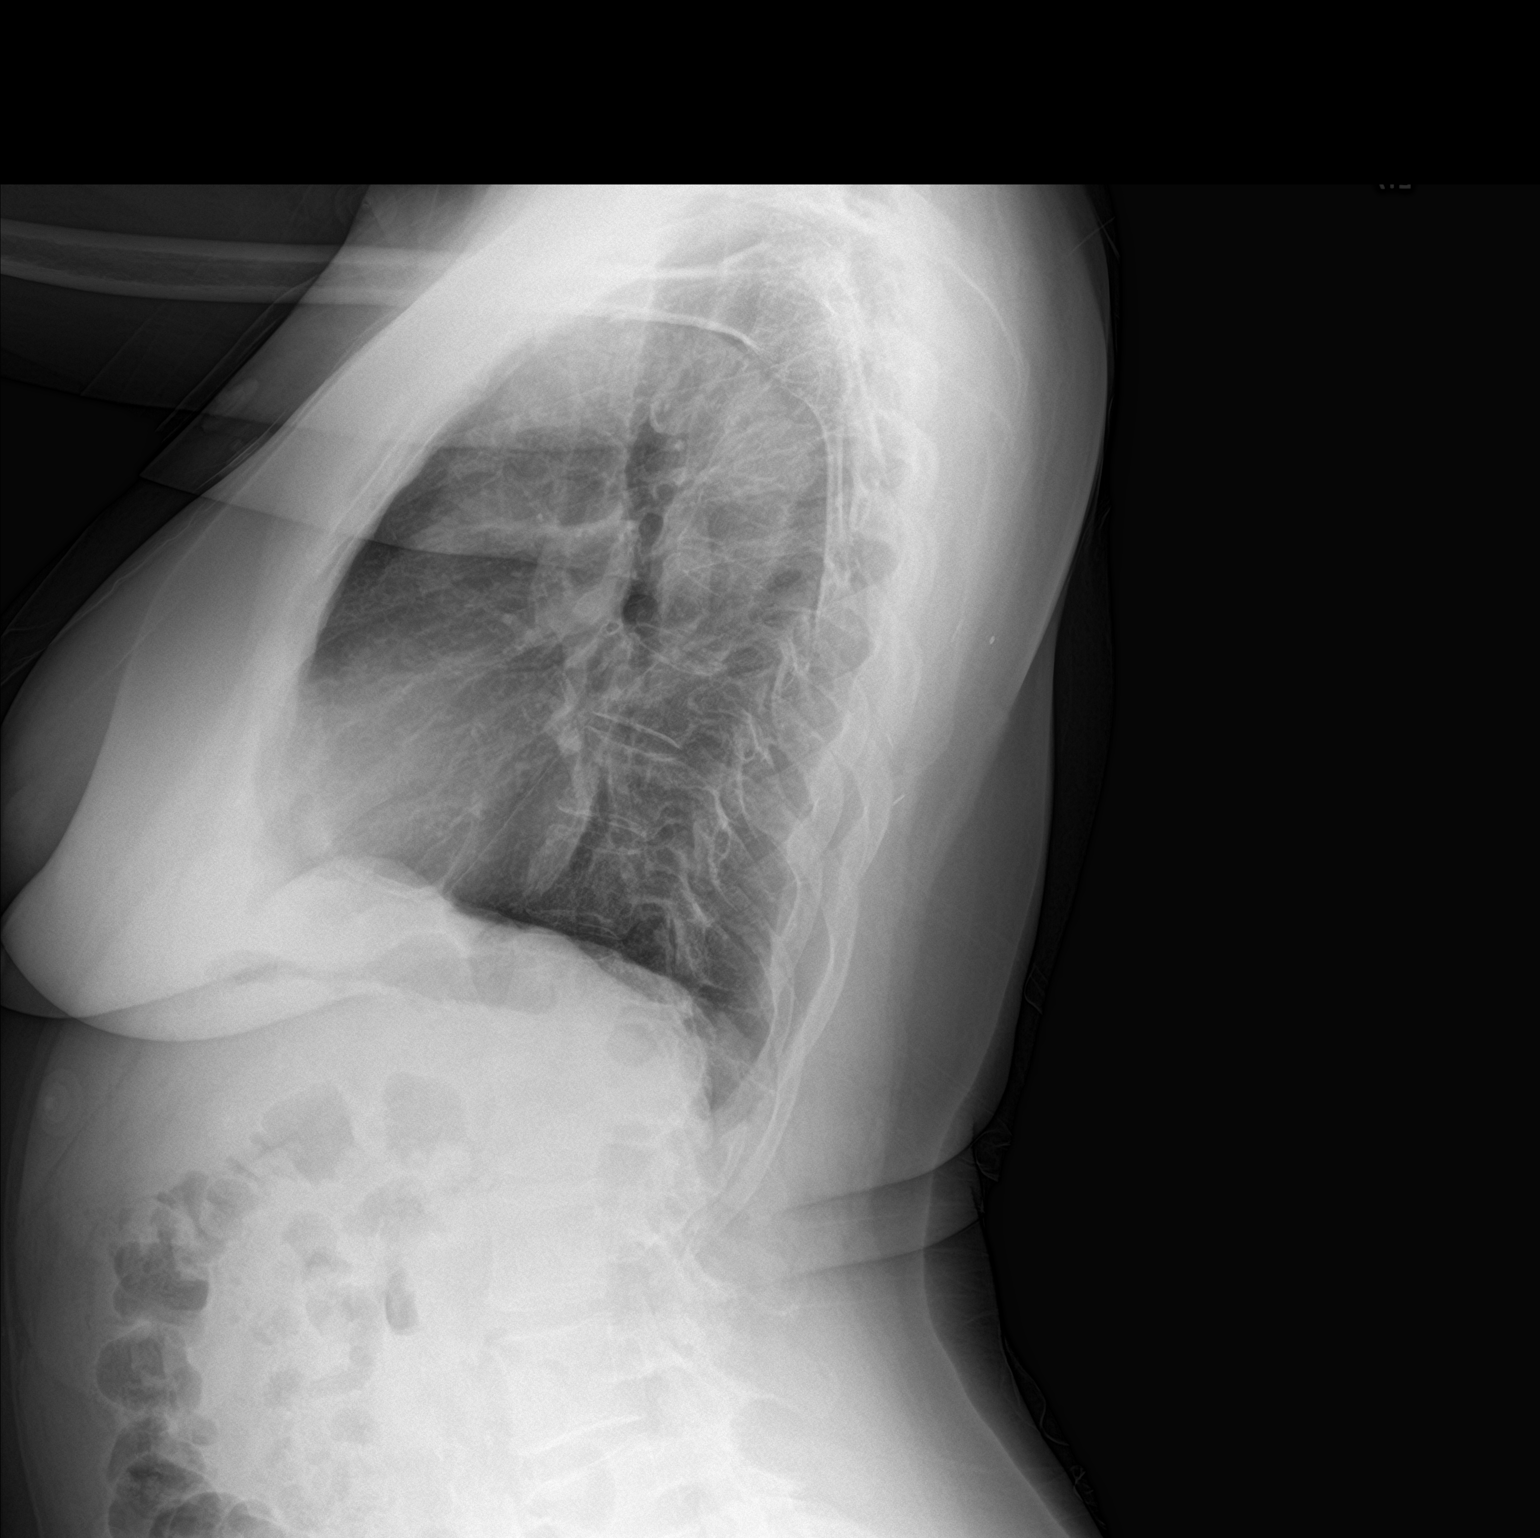

[2 of 2 positions shown; findings below may reference images not displayed]

FINDINGS: The heart size and mediastinal contours are within normal limits.
Both lungs are clear. Scoliosis of the spine. Aortic
atherosclerosis.
IMPRESSION: No active cardiopulmonary disease.

## 2022-01-01 ENCOUNTER — Other Ambulatory Visit: Payer: Self-pay | Admitting: Family Medicine

## 2022-01-01 DIAGNOSIS — R911 Solitary pulmonary nodule: Secondary | ICD-10-CM

## 2022-01-06 ENCOUNTER — Other Ambulatory Visit: Payer: Self-pay | Admitting: Cardiovascular Disease

## 2022-01-06 DIAGNOSIS — I2510 Atherosclerotic heart disease of native coronary artery without angina pectoris: Secondary | ICD-10-CM

## 2022-01-17 ENCOUNTER — Ambulatory Visit
Admission: RE | Admit: 2022-01-17 | Discharge: 2022-01-17 | Disposition: A | Discharge status: Home / Self Care | Payer: Federal, State, Local not specified - PPO | Source: Ambulatory Visit | Attending: Family Medicine | Admitting: Family Medicine

## 2022-01-17 DIAGNOSIS — R911 Solitary pulmonary nodule: Secondary | ICD-10-CM | POA: Diagnosis not present

## 2022-02-12 DIAGNOSIS — M87011 Idiopathic aseptic necrosis of right shoulder: Secondary | ICD-10-CM | POA: Diagnosis not present

## 2022-02-19 DIAGNOSIS — E119 Type 2 diabetes mellitus without complications: Secondary | ICD-10-CM | POA: Diagnosis not present

## 2022-02-19 DIAGNOSIS — J40 Bronchitis, not specified as acute or chronic: Secondary | ICD-10-CM | POA: Diagnosis not present

## 2022-02-19 DIAGNOSIS — R918 Other nonspecific abnormal finding of lung field: Secondary | ICD-10-CM | POA: Diagnosis not present

## 2022-02-19 DIAGNOSIS — I10 Essential (primary) hypertension: Secondary | ICD-10-CM | POA: Diagnosis not present

## 2022-02-21 ENCOUNTER — Other Ambulatory Visit: Payer: Self-pay | Admitting: Family Medicine

## 2022-02-21 DIAGNOSIS — R918 Other nonspecific abnormal finding of lung field: Secondary | ICD-10-CM

## 2022-03-12 ENCOUNTER — Telehealth: Payer: Self-pay | Admitting: Cardiovascular Disease

## 2022-03-12 NOTE — Telephone Encounter (Signed)
Pt c/o medication issue:  1. Name of Medication:    amoxicillin (AMOXIL) 500 MG capsule    2. How are you currently taking this medication (dosage and times per day)? TAKE 4 CAPSULES BY MOUTH 1 HOUR BEFORE TO DENTAL APPOINTMENT  3. Are you having a reaction (difficulty breathing--STAT)?   4. What is your medication issue? Brooked from [facility] called stating that they sent in a refill to pt pharmacy for this medication but they need a letter stating what pt needs this medication for and the dosage.   Fax number: [phone]

## 2022-03-13 NOTE — Telephone Encounter (Signed)
Burnell Blanks, MD  You 21 hours ago (1:48 PM)   I don't see a cardiac reason either. [name]  Burnell Blanks, MD 22 hours ago (12:33 PM)    I am not seeing a cardiac reason for dental pre med.  Amox is on her list by historical provider.   Called [name] back at dental office and advised.  She said they have been prescribing it for her and the patient told her that it originally was from her heart doctor.  She will follow up with the patient.

## 2022-04-16 ENCOUNTER — Ambulatory Visit
Admission: RE | Admit: 2022-04-16 | Discharge: 2022-04-16 | Disposition: A | Discharge status: Home / Self Care | Payer: Federal, State, Local not specified - PPO | Source: Ambulatory Visit | Attending: Family Medicine | Admitting: Family Medicine

## 2022-04-16 DIAGNOSIS — R918 Other nonspecific abnormal finding of lung field: Secondary | ICD-10-CM

## 2022-04-16 DIAGNOSIS — I700 Atherosclerosis of aorta: Secondary | ICD-10-CM | POA: Diagnosis not present

## 2022-04-16 DIAGNOSIS — R911 Solitary pulmonary nodule: Secondary | ICD-10-CM | POA: Diagnosis not present

## 2022-04-16 DIAGNOSIS — J439 Emphysema, unspecified: Secondary | ICD-10-CM | POA: Diagnosis not present

## 2022-05-21 DIAGNOSIS — E119 Type 2 diabetes mellitus without complications: Secondary | ICD-10-CM | POA: Diagnosis not present

## 2022-05-21 DIAGNOSIS — Z79899 Other long term (current) drug therapy: Secondary | ICD-10-CM | POA: Diagnosis not present

## 2022-05-21 DIAGNOSIS — E782 Mixed hyperlipidemia: Secondary | ICD-10-CM | POA: Diagnosis not present

## 2022-05-21 DIAGNOSIS — M1991 Primary osteoarthritis, unspecified site: Secondary | ICD-10-CM | POA: Diagnosis not present

## 2022-05-21 DIAGNOSIS — L930 Discoid lupus erythematosus: Secondary | ICD-10-CM | POA: Diagnosis not present

## 2022-05-28 ENCOUNTER — Ambulatory Visit: Payer: Federal, State, Local not specified - PPO | Admitting: Nurse Practitioner

## 2022-06-11 ENCOUNTER — Other Ambulatory Visit: Payer: Self-pay | Admitting: Obstetrics and Gynecology

## 2022-06-11 DIAGNOSIS — M25511 Pain in right shoulder: Secondary | ICD-10-CM | POA: Diagnosis not present

## 2022-06-11 DIAGNOSIS — N6452 Nipple discharge: Secondary | ICD-10-CM | POA: Diagnosis not present

## 2022-06-11 DIAGNOSIS — N6320 Unspecified lump in the left breast, unspecified quadrant: Secondary | ICD-10-CM

## 2022-06-11 DIAGNOSIS — M25512 Pain in left shoulder: Secondary | ICD-10-CM | POA: Diagnosis not present

## 2022-06-12 ENCOUNTER — Ambulatory Visit: Payer: Federal, State, Local not specified - PPO | Attending: Nurse Practitioner | Admitting: Nurse Practitioner

## 2022-06-12 ENCOUNTER — Encounter: Payer: Self-pay | Admitting: Nurse Practitioner

## 2022-06-12 VITALS — BP 136/80 | HR 75 | Ht 59.0 in | Wt 131.2 lb

## 2022-06-12 DIAGNOSIS — E785 Hyperlipidemia, unspecified: Secondary | ICD-10-CM

## 2022-06-12 DIAGNOSIS — I739 Peripheral vascular disease, unspecified: Secondary | ICD-10-CM

## 2022-06-12 DIAGNOSIS — I10 Essential (primary) hypertension: Secondary | ICD-10-CM | POA: Diagnosis not present

## 2022-06-12 DIAGNOSIS — I2510 Atherosclerotic heart disease of native coronary artery without angina pectoris: Secondary | ICD-10-CM

## 2022-06-12 MED ORDER — NITROGLYCERIN 0.4 MG SL SUBL: SUBLINGUAL_TABLET | SUBLINGUAL | 3 refills | Status: DC

## 2022-06-12 NOTE — Progress Notes (Signed)
Cardiology Office Note:    Date:  06/12/2022   ID:  [name], DOB 1955/11/15, MRN [id number]  PCP:  Shirline Frees, MD   [facility] Providers Cardiologist:  Lauree Chandler, MD     Referring MD: Shirline Frees, MD   Chief Complaint: follow-up CAD   History of Present Illness:    [name] is a pleasant 66 y.o. female with a hx of PAD, previous tobacco use, CAD with multiple stents, HLD, diabetes, percutaneous lupus, and GERD.   Prior PCI of RCA followed by stenting of RCA in 2002.  Cath in 2010 showed that her RCA was subtotally occluded distally.  She had 2 overlapping drug-eluting stents placed in distal RCA.  She has a total of 3 stents in the RCA.  Stress test in 2013 in the setting of chest pain showed possible lateral wall ischemia. Most recent cardiac catheterization 04/2012 which revealed double vessel CAD with patent stents RCA and severe stenosis in mid circumflex extending into the first OM branch, low normal LV systolic function, successful PTCA/bare-metal stent x 1 to mid circumflex.  Stress test August 2016 without ischemia.  Nuclear stress test April 2022 with no evidence of ischemia.  Normal LVEF, no valve disease on echo 06/2020.  Last cardiology clinic visit with Dr. Angelena Form was 06/19/2021.  She had described chest pain felt to be GERD and resolved with PPI.  Lipids were well-controlled.  No change to treatment regimen and she was advised to follow-up in 1 year.  Seen by Dr. Fletcher Anon for PAD on 12/05/21. In 2020 she reported bilateral calf claudication which happened after walking half a mile. Noninvasive vascular studies showed ABI 0.88 on the right and 0.83 on the left. Duplex showed moderate right SFA disease with two-vessel runoff below the knee. On the left there was borderline significant left S FA disease with three-vessel runoff below the knee.  Given that her symptoms were not lifestyle limiting, she will was recommended to start a walking  program and reduce risk factors.  Today, she is here alone for follow-up.  She reports she had steroid injections in both shoulders yesterday and as she was leaving she started to feel really strange.  She felt her heart racing, her body cramping up, and she had to walk really slowly. Has had them before but has not had 2 at the same time. I advised her to call the provider to report the reaction. Her BP is typically < 196 mmHg systolic. Has resumed working part-time for the [employer] office, does a lot of driving. Has not resumed walking routine since she saw Dr. Fletcher Anon in May. She denies chest pain, shortness of breath, lower extremity edema, fatigue, palpitations, melena, hematuria, hemoptysis, diaphoresis, weakness, presyncope, syncope, orthopnea, and PND.   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"   Depression    Diabetes mellitus without complication ([place])    type 2 dx july 2018   H/O: substance abuse ([place])    Hyperlipidemia    Mass on back    Myocardial infarction ([place])    "not sure;  nobody ever tells me" (04/23/2012)   Noncompliance    PVD (peripheral vascular disease) ([place])    MILD    Past Surgical History:  Procedure Laterality Date   ABDOMINAL HYSTERECTOMY  1999   ovaries removed weeks later   [place]  lasy February 02 2015   "have had 15 surgeries on my right ACL"   BREAST SURGERY     "removed knot from one side"   [place]  2002; 2010; 04/23/2012   "1 + 2 + 1; total of 4"   EXCISIONAL HEMORRHOIDECTOMY  yrs ago   [place]     points to stomach   LEFT HEART CATHETERIZATION WITH CORONARY  ANGIOGRAM N/A 04/23/2012   Procedure: LEFT HEART CATHETERIZATION WITH CORONARY ANGIOGRAM;  Surgeon: Burnell Blanks, MD;  Location: [facility] CATH LAB;  Service: Cardiovascular;  Laterality: N/A;   MASS EXCISION N/A 08/29/2017   Procedure: EXCISIONAL BIOPSY OF BACK SOFT TISSUE MASS;  Surgeon: Greer Pickerel, MD;  Location: [facility] ORS;  Service: General;  Laterality: N/A;   TONSILLECTOMY  early teens    Current Medications: Current Meds  Medication Sig   acetaminophen (TYLENOL) 500 MG tablet Take 1,000 mg by mouth every 6 (six) hours as needed for mild pain. For pain   aspirin 81 MG tablet Take 81 mg by mouth at bedtime.   atorvastatin (LIPITOR) 20 MG tablet Take 1 tablet (20 mg total) by mouth daily.   betamethasone dipropionate 0.05 % lotion Apply topically.   Cholecalciferol (HM VITAMIN D3) 100 MCG (4000 UT) CAPS Take by mouth daily.   cyclobenzaprine (FLEXERIL) 10 MG tablet Take 10 mg by mouth 3 (three) times daily as needed.   ezetimibe (ZETIA) 10 MG tablet Take 1 tablet (10 mg total) by mouth daily.   hydroxychloroquine (PLAQUENIL) 200 MG tablet Take 200 mg by mouth at bedtime.   ibuprofen (ADVIL,MOTRIN) 200 MG tablet Take 400 mg by mouth daily as needed for moderate pain.   LORazepam (ATIVAN) 0.5 MG tablet as needed.    metFORMIN (GLUCOPHAGE-XR) 500 MG 24 hr tablet Take 1,000 mg by mouth daily.   metoprolol succinate (TOPROL-XL) 25 MG 24 hr tablet TAKE 1 TABLET BY MOUTH EVERY DAY   Multiple Vitamin (MULTIVITAMIN) tablet Take 1 tablet by mouth daily.   omeprazole (PRILOSEC) 40 MG capsule Take 40 mg by mouth daily as needed.   triamcinolone ointment (KENALOG) 0.1 % Apply 1 application topically daily as needed (lupus flares).   zolpidem (AMBIEN) 10 MG tablet Take 10 mg by mouth at bedtime as needed for sleep.   [DISCONTINUED] nitroGLYCERIN (NITROSTAT) 0.4 MG SL tablet PLACE 1 TABLET (0.4 MG TOTAL) UNDER THE TONGUE EVERY 5 (FIVE) MINUTES AS NEEDED. FOR CHEST PAIN     Allergies:   Chantix  [varenicline]   Social History   Socioeconomic History   Marital status: Single    Spouse name: Not on file   Number of children: Not on file   Years of education: Not on file   Highest education level: Not on file  Occupational History   Not on file  Tobacco Use   Smoking status: Former    Packs/day: 1.50    Years: 38.00    Total pack years: 57.00    Types: Cigarettes    Quit date: 04/20/2012    Years since quitting: 10.1   Smokeless tobacco: Never  Vaping Use   Vaping Use: Never used  Substance and Sexual Activity   Alcohol use:  Yes    Alcohol/week: 14.0 standard drinks of alcohol    Types: 14 Cans of beer per week    Comment: beer 2 x week    Drug use: Yes    Types: Marijuana    Comment: smoked marijuana 09-04-16   Sexual activity: Never  Other Topics Concern   Not on file  Social History Narrative   Not on file   Social Determinants of Health   Financial Resource Strain: Not on file  Food Insecurity: Not on file  Transportation Needs: Not on file  Physical Activity: Not on file  Stress: Not on file  Social Connections: Not on file     Family History: The patient's family history includes Heart disease in her father; Heart failure in her father; Hypertension in her father and mother; Lung cancer in her mother; Stroke in her father.  ROS:   Please see the history of present illness.   All other systems reviewed and are negative.  Labs/Other Studies Reviewed:    The following studies were reviewed today:  Lexiscan Myoview 10/25/20  Nuclear stress EF: 62%. The left ventricular ejection fraction is normal (55-65%). T wave inversion was noted during stress in the V3 and V4 leads, and returning to baseline after less than 1 min of recovery. The study is normal with no evidence of ischemia or infarction. This is a low risk study.  Echo 06/21/20  1. Left ventricular ejection fraction, by estimation, is 60 to 65%. Left  ventricular ejection fraction by PLAX  is 62 %. The left ventricle has  normal function. The left ventricle demonstrates regional wall motion  abnormalities (see scoring  diagram/findings for description). Left ventricular diastolic parameters  are consistent with Grade I diastolic dysfunction (impaired relaxation).   2. Right ventricular systolic function is normal. The right ventricular  size is mildly enlarged. Mildly increased right ventricular wall  thickness.   3. The mitral valve is grossly normal. No evidence of mitral valve  regurgitation.   4. The aortic valve is normal in structure. Aortic valve regurgitation is  not visualized.   Comparison(s): A prior study was performed on 03/09/2015. No significant  change from prior study. Prior images reviewed side by side.  LE Arterial US 02/21/19  Right: Resting right ankle-brachial index indicates mild right lower  extremity arterial disease. The right toe-brachial index is abnormal.   Left: Resting left ankle-brachial index indicates mild left lower  extremity arterial disease. The left toe-brachial index is abnormal.    *See table(s) above for measurements and observations.     LHC 04/2012  Impression: 1. Double vessel CAD with patent stents RCA and severe stenosis in the mid Circumflex extending into the first OM branch.  2. Low normal LV systolic function 3. Unstable angina 4. Successful PTCA/bare metal stent x 1.    Recommendations: She will need dual antiplatelet with ASA and Plavix for at least one month before her planned surgical procedure. Continue statin/beta blocker. Smoking cessation recommended.   Recent Labs: 06/20/2021: ALT 20  Recent Lipid Panel    Component Value Date/Time   CHOL 110 06/20/2021 0730   TRIG 74 06/20/2021 0730   HDL 37 (L) 06/20/2021 0730   CHOLHDL 3.0 06/20/2021 0730   CHOLHDL 3.6 06/04/2016 0811   VLDL 14 06/04/2016 0811   LDLCALC 58 06/20/2021 0730     Risk Assessment/Calculations:           Physical Exam:     VS:  BP 136/80  Pulse 75   Ht '4\' 11"'$  (1.499 m)   Wt 131 lb 3.2 oz (59.5 kg)   SpO2 98%   BMI 26.50 kg/m     Wt Readings from Last 3 Encounters:  06/12/22 131 lb 3.2 oz (59.5 kg)  12/05/21 129 lb (58.5 kg)  06/19/21 130 lb 6.4 oz (59.1 kg)     GEN:  Well nourished, well developed in no acute distress HEENT: Normal NECK: No JVD; No carotid bruits CARDIAC: RRR, no murmurs, rubs, gallops RESPIRATORY:  Clear to auscultation without rales, wheezing or rhonchi  ABDOMEN: Soft, non-tender, non-distended MUSCULOSKELETAL:  No edema; No deformity. 2+ pedal pulses, equal bilaterally SKIN: Warm and dry NEUROLOGIC:  Alert and oriented x 3 PSYCHIATRIC:  Normal affect   EKG:  EKG is not ordered today.      Diagnoses:    1. Coronary artery disease involving native coronary artery of native heart without angina pectoris   2. Hyperlipidemia LDL goal <70   3. PAD (peripheral artery disease) ([place])   4. Essential hypertension    Assessment and Plan:     CAD without angina: Multiple stents as outlined above with most recent cath 2013 with severe stenosis in obtuse marginal treated with BMS.  Nuclear stress test 10/2020 with no evidence of ischemia. She denies chest pain, dyspnea, or other symptoms concerning for angina.  No indication for further ischemic evaluation at this time. Healthy lifestyle and secondary prevention emphasized.   Hyperlipidemia LDL goal < 70: LDL 67 on 05/21/22.  Continue ezetimibe, atorvastatin.  Hypertension: BP is well-controlled  PAD: History of calf claudication. Vascular testing 02/2019 showed stable disease with plan for medical management. Was advised to resume walking 30 minutes daily. Is not currently walking. Encouraged her to resume daily walking in addition to healthy diet.  Continue aspirin, ezetimibe, atorvastatin.     Disposition: 6 months with Dr. Fletcher Anon, 1 year with Dr. Angelena Form  Medication Adjustments/Labs and Tests Ordered: Current medicines are  reviewed at length with the patient today.  Concerns regarding medicines are outlined above.  No orders of the defined types were placed in this encounter.  Meds ordered this encounter  Medications   nitroGLYCERIN (NITROSTAT) 0.4 MG SL tablet    Sig: PLACE 1 TABLET (0.4 MG TOTAL) UNDER THE TONGUE EVERY 5 (FIVE) MINUTES AS NEEDED. FOR CHEST PAIN    Dispense:  25 tablet    Refill:  3    Patient Instructions  Medication Instructions:  No changes *If you need a refill on your cardiac medications before your next appointment, please call your pharmacy*   Lab Work: none   Testing/Procedures: none   Follow-Up: At [facility], you and your health needs are our priority.  As part of our continuing mission to provide you with exceptional heart care, we have created designated Provider Care Teams.  These Care Teams include your primary Cardiologist (physician) and Advanced Practice Providers (APPs -  Physician Assistants and Nurse Practitioners) who all work together to provide you with the care you need, when you need it.  We recommend signing up for the patient portal called "MyChart".  Sign up information is provided on this After Visit Summary.  MyChart is used to connect with patients for Virtual Visits (Telemedicine).  Patients are able to view lab/test results, encounter notes, upcoming appointments, etc.  Non-urgent messages can be sent to your provider as well.   To learn more about what you can do with MyChart, go to [email or web address].  Your next appointment:   12 month(s)  The format for your next appointment:   In Person  Provider:   Lauree Chandler, MD     Other Instructions Adopting a Healthy Lifestyle.   Weight: Know what a healthy weight is for you (roughly BMI <25) and aim to maintain this. You can calculate your body mass index on your smart phone  Diet: Aim for 7+ servings of fruits and vegetables daily Limit animal fats in diet for  cholesterol and heart health - choose grass fed whenever available Avoid highly processed foods (fast food burgers, tacos, fried chicken, pizza, hot dogs, french fries)  Saturated fat comes in the form of butter, lard, coconut oil, margarine, partially hydrogenated oils, and fat in meat. These increase your risk of cardiovascular disease.  Use healthy plant oils, such as olive, canola, soy, corn, sunflower and peanut.  Whole foods such as fruits, vegetables and whole grains have fiber  Men need > 38 grams of fiber per day Women need > 25 grams of fiber per day  Load up on vegetables and fruits - one-half of your plate: Aim for color and variety, and remember that potatoes dont count. Go for whole grains - one-quarter of your plate: Whole wheat, barley, wheat berries, quinoa, oats, brown rice, and foods made with them. If you want pasta, go with whole wheat pasta. Protein power - one-quarter of your plate: Fish, chicken, beans, and nuts are all healthy, versatile protein sources. Limit red meat. You need carbohydrates for energy! The type of carbohydrate is more important than the amount. Choose carbohydrates such as vegetables, fruits, whole grains, beans, and nuts in the place of white rice, white pasta, potatoes (baked or fried), macaroni and cheese, cakes, cookies, and donuts.  If youre thirsty, drink water. Coffee and tea are good in moderation, but skip sugary drinks and limit milk and dairy products to one or two daily servings. Keep sugar intake at 6 teaspoons or 24 grams or LESS       Exercise: Aim for 150 min of moderate intensity exercise weekly for heart health, and weights twice weekly for bone health Stay active - any steps are better than no steps! Aim for 7-9 hours of sleep daily        Important Information About Sugar         Signed, Emmaline Life, NP  06/12/2022 4:24 PM    [place]

## 2022-06-12 NOTE — Patient Instructions (Signed)
Medication Instructions:  No changes *If you need a refill on your cardiac medications before your next appointment, please call your pharmacy*   Lab Work: none   Testing/Procedures: none   Follow-Up: At [facility], you and your health needs are our priority.  As part of our continuing mission to provide you with exceptional heart care, we have created designated Provider Care Teams.  These Care Teams include your primary Cardiologist (physician) and Advanced Practice Providers (APPs -  Physician Assistants and Nurse Practitioners) who all work together to provide you with the care you need, when you need it.  We recommend signing up for the patient portal called "MyChart".  Sign up information is provided on this After Visit Summary.  MyChart is used to connect with patients for Virtual Visits (Telemedicine).  Patients are able to view lab/test results, encounter notes, upcoming appointments, etc.  Non-urgent messages can be sent to your provider as well.   To learn more about what you can do with MyChart, go to [email or web address].    Your next appointment:   12 month(s)  The format for your next appointment:   In Person  Provider:   Lauree Chandler, MD     Other Instructions Adopting a Healthy Lifestyle.   Weight: Know what a healthy weight is for you (roughly BMI <25) and aim to maintain this. You can calculate your body mass index on your smart phone  Diet: Aim for 7+ servings of fruits and vegetables daily Limit animal fats in diet for cholesterol and heart health - choose grass fed whenever available Avoid highly processed foods (fast food burgers, tacos, fried chicken, pizza, hot dogs, french fries)  Saturated fat comes in the form of butter, lard, coconut oil, margarine, partially hydrogenated oils, and fat in meat. These increase your risk of cardiovascular disease.  Use healthy plant oils, such as olive, canola, soy, corn, sunflower and peanut.   Whole foods such as fruits, vegetables and whole grains have fiber  Men need > 38 grams of fiber per day Women need > 25 grams of fiber per day  Load up on vegetables and fruits - one-half of your plate: Aim for color and variety, and remember that potatoes dont count. Go for whole grains - one-quarter of your plate: Whole wheat, barley, wheat berries, quinoa, oats, brown rice, and foods made with them. If you want pasta, go with whole wheat pasta. Protein power - one-quarter of your plate: Fish, chicken, beans, and nuts are all healthy, versatile protein sources. Limit red meat. You need carbohydrates for energy! The type of carbohydrate is more important than the amount. Choose carbohydrates such as vegetables, fruits, whole grains, beans, and nuts in the place of white rice, white pasta, potatoes (baked or fried), macaroni and cheese, cakes, cookies, and donuts.  If youre thirsty, drink water. Coffee and tea are good in moderation, but skip sugary drinks and limit milk and dairy products to one or two daily servings. Keep sugar intake at 6 teaspoons or 24 grams or LESS       Exercise: Aim for 150 min of moderate intensity exercise weekly for heart health, and weights twice weekly for bone health Stay active - any steps are better than no steps! Aim for 7-9 hours of sleep daily        Important Information About Sugar

## 2022-06-25 ENCOUNTER — Ambulatory Visit
Admission: RE | Admit: 2022-06-25 | Discharge: 2022-06-25 | Disposition: A | Discharge status: Home / Self Care | Payer: Federal, State, Local not specified - PPO | Source: Ambulatory Visit | Attending: Obstetrics and Gynecology | Admitting: Obstetrics and Gynecology

## 2022-06-25 DIAGNOSIS — N6320 Unspecified lump in the left breast, unspecified quadrant: Secondary | ICD-10-CM

## 2022-06-25 DIAGNOSIS — R928 Other abnormal and inconclusive findings on diagnostic imaging of breast: Secondary | ICD-10-CM | POA: Diagnosis not present

## 2022-06-25 DIAGNOSIS — N644 Mastodynia: Secondary | ICD-10-CM | POA: Diagnosis not present

## 2022-07-09 ENCOUNTER — Other Ambulatory Visit: Payer: Self-pay | Admitting: Cardiovascular Disease

## 2022-07-09 DIAGNOSIS — I10 Essential (primary) hypertension: Secondary | ICD-10-CM

## 2022-07-23 DIAGNOSIS — L538 Other specified erythematous conditions: Secondary | ICD-10-CM | POA: Diagnosis not present

## 2022-07-23 DIAGNOSIS — D492 Neoplasm of unspecified behavior of bone, soft tissue, and skin: Secondary | ICD-10-CM | POA: Diagnosis not present

## 2022-07-23 DIAGNOSIS — D224 Melanocytic nevi of scalp and neck: Secondary | ICD-10-CM | POA: Diagnosis not present

## 2022-07-23 DIAGNOSIS — L700 Acne vulgaris: Secondary | ICD-10-CM | POA: Diagnosis not present

## 2022-08-05 ENCOUNTER — Other Ambulatory Visit: Payer: Self-pay | Admitting: Cardiovascular Disease

## 2022-08-05 DIAGNOSIS — I2510 Atherosclerotic heart disease of native coronary artery without angina pectoris: Secondary | ICD-10-CM

## 2022-08-08 ENCOUNTER — Other Ambulatory Visit: Payer: Self-pay | Admitting: Cardiovascular Disease

## 2022-08-10 ENCOUNTER — Telehealth: Payer: Self-pay | Admitting: Cardiovascular Disease

## 2022-08-10 NOTE — Telephone Encounter (Signed)
Pt wants to know if its safe her to take some ibuprofen with her other cardiac medications

## 2022-08-10 NOTE — Telephone Encounter (Signed)
Pt has history of CAD with multiple stents, she should avoid chronic use of NSAIDs given her cardiac history. Prefer she use Tylenol and try to minimize NSAID use.

## 2022-08-13 NOTE — Telephone Encounter (Signed)
Flexeril is fine for her to take

## 2022-08-16 DIAGNOSIS — J029 Acute pharyngitis, unspecified: Secondary | ICD-10-CM | POA: Diagnosis not present

## 2022-08-16 DIAGNOSIS — J3489 Other specified disorders of nose and nasal sinuses: Secondary | ICD-10-CM | POA: Diagnosis not present

## 2022-08-16 DIAGNOSIS — R509 Fever, unspecified: Secondary | ICD-10-CM | POA: Diagnosis not present

## 2022-09-03 DIAGNOSIS — E119 Type 2 diabetes mellitus without complications: Secondary | ICD-10-CM | POA: Diagnosis not present

## 2022-09-03 DIAGNOSIS — R918 Other nonspecific abnormal finding of lung field: Secondary | ICD-10-CM | POA: Diagnosis not present

## 2022-09-03 DIAGNOSIS — E782 Mixed hyperlipidemia: Secondary | ICD-10-CM | POA: Diagnosis not present

## 2022-09-03 DIAGNOSIS — I10 Essential (primary) hypertension: Secondary | ICD-10-CM | POA: Diagnosis not present

## 2022-09-05 DIAGNOSIS — M25512 Pain in left shoulder: Secondary | ICD-10-CM | POA: Diagnosis not present

## 2022-10-08 DIAGNOSIS — J069 Acute upper respiratory infection, unspecified: Secondary | ICD-10-CM | POA: Diagnosis not present

## 2022-10-31 ENCOUNTER — Telehealth: Payer: Self-pay | Admitting: Cardiovascular Disease

## 2022-10-31 DIAGNOSIS — I739 Peripheral vascular disease, unspecified: Secondary | ICD-10-CM

## 2022-10-31 NOTE — Telephone Encounter (Signed)
Schedule lower extremity segmental arterial Doppler to ensure that her circulation is stable.

## 2022-10-31 NOTE — Telephone Encounter (Signed)
Patient called stating on Monday she had left leg pain that was sharp up to her knee.  She states a tingling feeling traveled from her foot up to her knee, it lasted about 10 mins.  She said her left leg just gave out. She was at her doctor's office when this happened she states they checked her BP, she thinks it was 127/80,  her sugar was 124.  They advised her she give [name] a call.

## 2022-10-31 NOTE — Telephone Encounter (Signed)
Called and spoke with the patient concerning her symptoms. She stated that yesterday when she was with her son at his doctor's appointment she had left leg weakness when she stood up. She felt like her leg would give out. The longer she stood she began to feel tingling from her thigh down to her feet. She denied any pain and stated this is the first time this has occurred.  She is due for a follow up. One has been made for 5/28 with Dr. Kirke Corin.

## 2022-10-31 NOTE — Telephone Encounter (Signed)
She states she was at the doctor office with her son, not HER doctor.  She states that she has reached out to her PCP and they wanted her to be seen.  She states she thought she might should see her cardiology doctor rather than her PCP and wanted to see what Dr Kirke Corin thinks he should do.    She states her son thought she stood up to fast so she rested for a minute but then when she stood up it went all the way up her le and f"felt tighter and tighter".  Her BP and BG were checked while she was there.  No further issues since then it just scared her. She states she hasn't had issues prior to or after.  She does then mention when she gets up in the morning its like "pins or electric shock, real sharp" and can't move it until rub her leg and then waits until its gone so she can get up. This last 3-4 minutes.  Happens approx. once a week.  She was last seen May 2023. Will send message to scheduling to get an appt. Soon as well.   Please advise if needs anything prior to an appt.

## 2022-11-01 NOTE — Telephone Encounter (Signed)
Patient has been made aware of the doppler. Orders placed and message sent to scheduling.

## 2022-11-02 NOTE — Telephone Encounter (Signed)
Lower extremity doppler scheduled for 5/6

## 2022-11-12 DIAGNOSIS — Z13820 Encounter for screening for osteoporosis: Secondary | ICD-10-CM | POA: Diagnosis not present

## 2022-11-12 DIAGNOSIS — Z01419 Encounter for gynecological examination (general) (routine) without abnormal findings: Secondary | ICD-10-CM | POA: Diagnosis not present

## 2022-11-12 DIAGNOSIS — Z1231 Encounter for screening mammogram for malignant neoplasm of breast: Secondary | ICD-10-CM | POA: Diagnosis not present

## 2022-11-16 ENCOUNTER — Other Ambulatory Visit (HOSPITAL_COMMUNITY): Payer: Self-pay | Admitting: Cardiovascular Disease

## 2022-11-16 DIAGNOSIS — I739 Peripheral vascular disease, unspecified: Secondary | ICD-10-CM

## 2022-11-19 ENCOUNTER — Ambulatory Visit (HOSPITAL_COMMUNITY): Payer: Federal, State, Local not specified - PPO

## 2022-11-26 DIAGNOSIS — Z79899 Other long term (current) drug therapy: Secondary | ICD-10-CM | POA: Diagnosis not present

## 2022-11-26 DIAGNOSIS — M5432 Sciatica, left side: Secondary | ICD-10-CM | POA: Diagnosis not present

## 2022-11-26 DIAGNOSIS — L930 Discoid lupus erythematosus: Secondary | ICD-10-CM | POA: Diagnosis not present

## 2022-11-26 DIAGNOSIS — M1991 Primary osteoarthritis, unspecified site: Secondary | ICD-10-CM | POA: Diagnosis not present

## 2022-11-27 DIAGNOSIS — H40013 Open angle with borderline findings, low risk, bilateral: Secondary | ICD-10-CM | POA: Diagnosis not present

## 2022-11-27 DIAGNOSIS — M329 Systemic lupus erythematosus, unspecified: Secondary | ICD-10-CM | POA: Diagnosis not present

## 2022-11-27 DIAGNOSIS — E119 Type 2 diabetes mellitus without complications: Secondary | ICD-10-CM | POA: Diagnosis not present

## 2022-11-27 DIAGNOSIS — Z79899 Other long term (current) drug therapy: Secondary | ICD-10-CM | POA: Diagnosis not present

## 2022-11-28 ENCOUNTER — Ambulatory Visit (HOSPITAL_COMMUNITY)
Admission: RE | Admit: 2022-11-28 | Discharge: 2022-11-28 | Disposition: A | Discharge status: Home / Self Care | Payer: Federal, State, Local not specified - PPO | Source: Ambulatory Visit | Attending: Cardiovascular Disease | Admitting: Cardiovascular Disease

## 2022-11-28 DIAGNOSIS — I739 Peripheral vascular disease, unspecified: Secondary | ICD-10-CM

## 2022-11-28 LAB — VAS US ABI WITH/WO TBI
Left ABI: 0.89
Right ABI: 0.78

## 2022-12-11 ENCOUNTER — Ambulatory Visit: Payer: Federal, State, Local not specified - PPO | Attending: Cardiovascular Disease | Admitting: Cardiovascular Disease

## 2022-12-11 ENCOUNTER — Encounter: Payer: Self-pay | Admitting: Cardiovascular Disease

## 2022-12-11 VITALS — BP 110/64 | HR 71 | Ht 59.0 in | Wt 131.8 lb

## 2022-12-11 DIAGNOSIS — E785 Hyperlipidemia, unspecified: Secondary | ICD-10-CM

## 2022-12-11 DIAGNOSIS — I739 Peripheral vascular disease, unspecified: Secondary | ICD-10-CM

## 2022-12-11 DIAGNOSIS — I2510 Atherosclerotic heart disease of native coronary artery without angina pectoris: Secondary | ICD-10-CM

## 2022-12-11 DIAGNOSIS — I10 Essential (primary) hypertension: Secondary | ICD-10-CM

## 2022-12-11 NOTE — Progress Notes (Signed)
Cardiology Office Note   Date:  12/11/2022   ID:  [name], DOB September 20, 1955, MRN [id number]  PCP:  Johny Blamer, MD  Cardiologist: Dr. Clifton James  No chief complaint on file.     History of Present Illness: [name] is a 67 y.o. female who is here today for follow-up visit regarding  peripheral arterial disease.  She has known history of coronary artery disease, previous tobacco use, hyperlipidemia, diabetes, percutaneous lupus and peripheral arterial disease. She was seen in 2020 for bilateral calf claudication which happened after walking half a mile. Noninvasive vascular studies showed an ABI of 0.88 on the right and 0.83 on the left.  Duplex showed moderate right SFA disease with two-vessel runoff below the knee.  On the left, there was borderline significant left SFA disease with three-vessel runoff below the knee. Given that her symptoms were not lifestyle limiting, I recommended a walking program and treatment of risk factors.   She reports worsening right calf claudication.  She underwent recent Doppler studies which showed a drop in ABI on the right side to 0.78 and stable on the left at 0.89.  Duplex on the right showed moderate common femoral and distal SFA disease. She is still able to do most activities of daily living although she does feel limited by right calf claudication.   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"   Depression    Diabetes mellitus without complication (HCC)    type 2 dx july 2018   H/O: substance abuse (HCC)    Hyperlipidemia    Mass on back    Myocardial infarction [facility])    "not sure; nobody ever  tells me" (04/23/2012)   Noncompliance    PVD (peripheral vascular disease) (HCC)    MILD    Past Surgical History:  Procedure Laterality Date   ABDOMINAL HYSTERECTOMY  1999   ovaries removed weeks later   ANTERIOR CRUCIATE LIGAMENT REPAIR  lasy February 02 2015   "have had 15 surgeries on my right ACL"   BREAST SURGERY     "removed knot from one side"   CORONARY ANGIOPLASTY WITH STENT PLACEMENT  2002; 2010; 04/23/2012   "1 + 2 + 1; total of 4"   EXCISIONAL HEMORRHOIDECTOMY  yrs ago   HERNIA REPAIR     points to stomach   LEFT HEART CATHETERIZATION WITH CORONARY ANGIOGRAM N/A 04/23/2012   Procedure: LEFT HEART CATHETERIZATION WITH CORONARY ANGIOGRAM;  Surgeon: Kathleene Hazel, MD;  Location: [facility] CATH LAB;  Service: Cardiovascular;  Laterality: N/A;   MASS EXCISION N/A 08/29/2017   Procedure: EXCISIONAL BIOPSY OF BACK SOFT TISSUE MASS;  Surgeon: Gaynelle Adu, MD;  Location: [facility] ORS;  Service: General;  Laterality: N/A;   TONSILLECTOMY  early teens     Current Outpatient Medications  Medication Sig Dispense Refill   acetaminophen (TYLENOL) 500 MG tablet Take 1,000 mg by mouth every 6 (six) hours as needed for mild pain. For pain     aspirin 81 MG tablet Take 81 mg by mouth at bedtime.     atorvastatin (  LIPITOR) 20 MG tablet TAKE 1 TABLET BY MOUTH EVERY DAY 90 tablet 2   betamethasone dipropionate 0.05 % lotion Apply topically.     Cholecalciferol (HM VITAMIN D3) 100 MCG (4000 UT) CAPS Take by mouth daily.     cyclobenzaprine (FLEXERIL) 10 MG tablet Take 10 mg by mouth 3 (three) times daily as needed.     ezetimibe (ZETIA) 10 MG tablet TAKE 1 TABLET BY MOUTH EVERY DAY 90 tablet 2   hydroxychloroquine (PLAQUENIL) 200 MG tablet Take 200 mg by mouth at bedtime.     ibuprofen (ADVIL,MOTRIN) 200 MG tablet Take 400 mg by mouth daily as needed for moderate pain.     LORazepam (ATIVAN) 0.5 MG tablet as needed.      metFORMIN (GLUCOPHAGE-XR) 500 MG 24 hr tablet Take 1,000 mg by mouth daily.      metoprolol succinate (TOPROL-XL) 25 MG 24 hr tablet Take 1 tablet (25 mg total) by mouth daily. 90 tablet 3   Multiple Vitamin (MULTIVITAMIN) tablet Take 1 tablet by mouth daily.     nitroGLYCERIN (NITROSTAT) 0.4 MG SL tablet PLACE 1 TABLET (0.4 MG TOTAL) UNDER THE TONGUE EVERY 5 (FIVE) MINUTES AS NEEDED. FOR CHEST PAIN 25 tablet 3   omeprazole (PRILOSEC) 40 MG capsule Take 40 mg by mouth daily as needed.     triamcinolone ointment (KENALOG) 0.1 % Apply 1 application topically daily as needed (lupus flares).     zolpidem (AMBIEN) 10 MG tablet Take 10 mg by mouth at bedtime as needed for sleep.     amoxicillin (AMOXIL) 500 MG capsule  (Patient not taking: Reported on 12/11/2022)     No current facility-administered medications for this visit.    Allergies:   Chantix [varenicline]    Social History:  The patient  reports that she quit smoking about 10 years ago. Her smoking use included cigarettes. She has a 57.00 pack-year smoking history. She has never used smokeless tobacco. She reports current alcohol use of about 14.0 standard drinks of alcohol per week. She reports current drug use. Drug: Marijuana.   Family History:  The patient's family history includes Colon cancer in her maternal grandmother; Heart disease in her father; Heart failure in her father; Hypertension in her father and mother; Lung cancer in her mother; Stroke in her father.    ROS:  Please see the history of present illness.   Otherwise, review of systems are positive for none.   All other systems are reviewed and negative.    PHYSICAL EXAM: VS:  BP 110/64 (BP Location: Left Arm, Patient Position: Sitting, Cuff Size: Normal)   Pulse 71   Ht 4\' 11"  (1.499 m)   Wt 131 lb 12.8 oz (59.8 kg)   SpO2 98%   BMI 26.62 kg/m  , BMI Body mass index is 26.62 kg/m. GEN: Well nourished, well developed, in no acute distress  HEENT: normal  Neck: no JVD, carotid bruits, or masses Cardiac: RRR; no  rubs, or gallops,no edema .   1/ 6 systolic murmur in the aortic area Respiratory:  clear to auscultation bilaterally, normal work of breathing GI: soft, nontender, nondistended, + BS MS: no deformity or atrophy  Skin: warm and dry, no rash Neuro:  Strength and sensation are intact Psych: euthymic mood, full affect Vascular:  Distal pulses are not palpable.   EKG:  EKG is ordered today. I reviewed her EKG which showed normal sinus rhythm with no significant ST or T wave changes.  Possible left atrial enlargement.  Recent Labs: No results found for requested labs within last 365 days.    Lipid Panel    Component Value Date/Time   CHOL 110 06/20/2021 0730   TRIG 74 06/20/2021 0730   HDL 37 (L) 06/20/2021 0730   CHOLHDL 3.0 06/20/2021 0730   CHOLHDL 3.6 06/04/2016 0811   VLDL 14 06/04/2016 0811   LDLCALC 58 06/20/2021 0730      Wt Readings from Last 3 Encounters:  12/11/22 131 lb 12.8 oz (59.8 kg)  06/12/22 131 lb 3.2 oz (59.5 kg)  12/05/21 129 lb (58.5 kg)           No data to display            ASSESSMENT AND PLAN:  1.  Peripheral arterial disease: See report worsening right calf claudication likely due to progression of common femoral artery stenosis and SFA disease.  Her claudication is moderate in intensity and not lifestyle limiting at this point.  I discussed with her revascularization options but will hold off for progression of symptoms.  I gave her the option of starting cilostazol but she does not like taking too many medications. I gave her instructions to start a walking exercise program.  2.  Coronary artery disease involving native coronary arteries without angina: An Myoview in 2022 showed no evidence of ischemia.  3.  Essential hypertension: Blood pressure is well controlled on current medications.  4.  Hyperlipidemia: Continue treatment with atorvastatin and Zetia.  Most recent lipid profile showed an LDL of 67.   Disposition:   FU with me in 6  months  Signed,  Lorine Bears, MD  12/11/2022 5:26 PM    [facility]

## 2022-12-11 NOTE — Patient Instructions (Addendum)
Medication Instructions:  No changes *If you need a refill on your cardiac medications before your next appointment, please call your pharmacy*   Lab Work: None ordered If you have labs (blood work) drawn today and your tests are completely normal, you will receive your results only by: MyChart Message (if you have MyChart) OR A paper copy in the mail If you have any lab test that is abnormal or we need to change your treatment, we will call you to review the results.   Testing/Procedures: None ordered   Follow-Up: At [facility], you and your health needs are our priority.  As part of our continuing mission to provide you with exceptional heart care, we have created designated Provider Care Teams.  These Care Teams include your primary Cardiologist (physician) and Advanced Practice Providers (APPs -  Physician Assistants and Nurse Practitioners) who all work together to provide you with the care you need, when you need it.  We recommend signing up for the patient portal called "MyChart".  Sign up information is provided on this After Visit Summary.  MyChart is used to connect with patients for Virtual Visits (Telemedicine).  Patients are able to view lab/test results, encounter notes, upcoming appointments, etc.  Non-urgent messages can be sent to your provider as well.   To learn more about what you can do with MyChart, go to [email or web address].    Your next appointment:   6 month(s)  Provider:   Dr. Arida  EXERCISE PROGRAM FOR INDIVIDUALS WITH  PERIPHERAL ARTERIAL DISEASE (PAD)   General Information:   Research in vascular exercise has demonstrated remarkable improvement in symptoms of leg pain (claudication) without expensive or invasive interventions. Regular walking programs are extremely helpful for patients with PAD and intermittent claudication.  These steps are designed to help you get started with a safe and effective program to help you walk farther with  less pain:   Walk at least three times a week (preferably every day).  Your goal is to build up to 30-45 minutes of total walking time (not counting rest breaks). It may take you several weeks to build up your exercise time starting at 5-10 minutes or whatever you can tolerate.  Walk as far as possible using moderate to maximal pain (7-8 on the scale below) as a signal to stop, and resume walking when the pain goes away.  On a treadmill, set the speed and grade at a level that brings on the claudication pain within 3 to 5 minutes. Walk at this rate until you experience claudication of moderate severity, rest until the pain improves, and then resume walking.  Over time, you will be able to walk longer at the designated speed and grade; workload should then be increased until you develop the pain within 3 to 5 minutes once again.  This regimen will induce a significant benefit. Studies have demonstrated that participants may be able to walk up to three or four times farther and have less leg pain, within twelve weeks, by following this protocol.  Pain Scale    0_____1_____2_____3_____4_____5_____6_____7_____8_____9_____10   No Pain                                   Moderate Pain                               Maximal Pain  

## 2023-01-02 DIAGNOSIS — M8580 Other specified disorders of bone density and structure, unspecified site: Secondary | ICD-10-CM | POA: Diagnosis not present

## 2023-02-11 DIAGNOSIS — H1131 Conjunctival hemorrhage, right eye: Secondary | ICD-10-CM | POA: Diagnosis not present

## 2023-02-25 ENCOUNTER — Other Ambulatory Visit: Payer: Self-pay | Admitting: Cardiovascular Disease

## 2023-02-25 DIAGNOSIS — I2510 Atherosclerotic heart disease of native coronary artery without angina pectoris: Secondary | ICD-10-CM

## 2023-02-27 DIAGNOSIS — M25511 Pain in right shoulder: Secondary | ICD-10-CM | POA: Diagnosis not present

## 2023-02-27 DIAGNOSIS — M25512 Pain in left shoulder: Secondary | ICD-10-CM | POA: Diagnosis not present

## 2023-03-11 ENCOUNTER — Other Ambulatory Visit: Payer: Self-pay | Admitting: Family Medicine

## 2023-03-11 DIAGNOSIS — I10 Essential (primary) hypertension: Secondary | ICD-10-CM | POA: Diagnosis not present

## 2023-03-11 DIAGNOSIS — R918 Other nonspecific abnormal finding of lung field: Secondary | ICD-10-CM

## 2023-03-11 DIAGNOSIS — E782 Mixed hyperlipidemia: Secondary | ICD-10-CM | POA: Diagnosis not present

## 2023-03-11 DIAGNOSIS — E119 Type 2 diabetes mellitus without complications: Secondary | ICD-10-CM | POA: Diagnosis not present

## 2023-04-22 ENCOUNTER — Ambulatory Visit
Admission: RE | Admit: 2023-04-22 | Discharge: 2023-04-22 | Disposition: A | Discharge status: Home / Self Care | Payer: Federal, State, Local not specified - PPO | Source: Ambulatory Visit | Attending: Family Medicine | Admitting: Family Medicine

## 2023-04-22 DIAGNOSIS — Z01818 Encounter for other preprocedural examination: Secondary | ICD-10-CM | POA: Diagnosis not present

## 2023-04-22 DIAGNOSIS — R918 Other nonspecific abnormal finding of lung field: Secondary | ICD-10-CM

## 2023-04-22 DIAGNOSIS — I700 Atherosclerosis of aorta: Secondary | ICD-10-CM | POA: Diagnosis not present

## 2023-04-22 DIAGNOSIS — I2510 Atherosclerotic heart disease of native coronary artery without angina pectoris: Secondary | ICD-10-CM | POA: Diagnosis not present

## 2023-04-30 DIAGNOSIS — L309 Dermatitis, unspecified: Secondary | ICD-10-CM | POA: Diagnosis not present

## 2023-05-20 ENCOUNTER — Ambulatory Visit: Payer: Federal, State, Local not specified - PPO | Attending: Cardiovascular Disease | Admitting: Cardiovascular Disease

## 2023-05-20 ENCOUNTER — Encounter: Payer: Self-pay | Admitting: Cardiovascular Disease

## 2023-05-20 VITALS — BP 140/80 | HR 60 | Ht 59.0 in | Wt 130.8 lb

## 2023-05-20 DIAGNOSIS — I2510 Atherosclerotic heart disease of native coronary artery without angina pectoris: Secondary | ICD-10-CM | POA: Diagnosis not present

## 2023-05-20 DIAGNOSIS — I739 Peripheral vascular disease, unspecified: Secondary | ICD-10-CM | POA: Diagnosis not present

## 2023-05-20 DIAGNOSIS — E785 Hyperlipidemia, unspecified: Secondary | ICD-10-CM

## 2023-05-20 NOTE — Patient Instructions (Signed)
Medication Instructions:  No changes today *If you need a refill on your cardiac medications before your next appointment, please call your pharmacy*   Lab Work: Today: lipids/liver If you have labs (blood work) drawn today and your tests are completely normal, you will receive your results only by: MyChart Message (if you have MyChart) OR A paper copy in the mail If you have any lab test that is abnormal or we need to change your treatment, we will call you to review the results.   Testing/Procedures: none   Follow-Up: At [facility], you and your health needs are our priority.  As part of our continuing mission to provide you with exceptional heart care, we have created designated Provider Care Teams.  These Care Teams include your primary Cardiologist (physician) and Advanced Practice Providers (APPs -  Physician Assistants and Nurse Practitioners) who all work together to provide you with the care you need, when you need it.  We recommend signing up for the patient portal called "MyChart".  Sign up information is provided on this After Visit Summary.  MyChart is used to connect with patients for Virtual Visits (Telemedicine).  Patients are able to view lab/test results, encounter notes, upcoming appointments, etc.  Non-urgent messages can be sent to your provider as well.   To learn more about what you can do with MyChart, go to [email or web address].    Your next appointment:   12 month(s)  Provider:   Verne Carrow, MD

## 2023-05-20 NOTE — Progress Notes (Signed)
Chief Complaint  Patient presents with   Follow-up    CAD   History of Present Illness: 67 yo female with history of CAD, former tobacco abuse, cutaneous lupus, anxiety, depression, HLD, PAD and DM here today for cardiac follow up. She has known CAD with prior PCI of the RCA followed by stenting of the RCA in 2002. Cath in 2010 showed that her RCA was subtotally occluded distally. She then had two overlapping drug eluting stents placed in the distal RCA. She has a total of 3 stents in the RCA. Stress test in 2013 in the setting of chest pain showed possible lateral wall ischemia. Cath October 2013 with severe stenosis in the obtuse marginal treated with a bare metal stent. Stress test August 2016 without ischemia. Echo December 2021 with LVEF=60-65%, no valve disease. Nuclear stress test April 2022 with no evidence of ischemia. Her PAD is followed by in our PV clinic by Dr. Kirke Corin.   He is here today for follow up. The patient denies any chest pain, dyspnea, palpitations, lower extremity edema, orthopnea, PND, dizziness, near syncope or syncope.    Primary Care Physician: Noberto Retort, MD  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"   Depression    Diabetes mellitus without complication (HCC)    type 2 dx july 2018   H/O: substance abuse (HCC)    Hyperlipidemia    Mass on back    Myocardial infarction [facility])    "not sure; nobody ever tells me" (04/23/2012)   Noncompliance    PVD (peripheral vascular disease) (HCC)    MILD    Past Surgical History:  Procedure Laterality Date   ABDOMINAL HYSTERECTOMY  1999   ovaries removed weeks later   ANTERIOR  CRUCIATE LIGAMENT REPAIR  lasy February 02 2015   "have had 15 surgeries on my right ACL"   BREAST SURGERY     "removed knot from one side"   CORONARY ANGIOPLASTY WITH STENT PLACEMENT  2002; 2010; 04/23/2012   "1 + 2 + 1; total of 4"   EXCISIONAL HEMORRHOIDECTOMY  yrs ago   HERNIA REPAIR     points to stomach   LEFT HEART CATHETERIZATION WITH CORONARY ANGIOGRAM N/A 04/23/2012   Procedure: LEFT HEART CATHETERIZATION WITH CORONARY ANGIOGRAM;  Surgeon: Kathleene Hazel, MD;  Location: [facility] CATH LAB;  Service: Cardiovascular;  Laterality: N/A;   MASS EXCISION N/A 08/29/2017   Procedure: EXCISIONAL BIOPSY OF BACK SOFT TISSUE MASS;  Surgeon: Gaynelle Adu, MD;  Location: [facility] ORS;  Service: General;  Laterality: N/A;   TONSILLECTOMY  early teens    Current Outpatient Medications  Medication Sig Dispense Refill   acetaminophen (TYLENOL) 500 MG tablet Take 1,000 mg by mouth every 6 (six) hours as needed for mild pain. For pain     aspirin 81 MG tablet Take 81 mg by mouth at bedtime.     atorvastatin (LIPITOR) 20 MG tablet TAKE 1 TABLET BY MOUTH EVERY DAY 90 tablet 2   betamethasone dipropionate 0.05 % lotion Apply topically.     Cholecalciferol (HM VITAMIN D3) 100  MCG (4000 UT) CAPS Take by mouth daily.     cyclobenzaprine (FLEXERIL) 10 MG tablet Take 10 mg by mouth 3 (three) times daily as needed.     ezetimibe (ZETIA) 10 MG tablet TAKE 1 TABLET BY MOUTH EVERY DAY 90 tablet 2   hydroxychloroquine (PLAQUENIL) 200 MG tablet Take 200 mg by mouth at bedtime.     ibuprofen (ADVIL,MOTRIN) 200 MG tablet Take 400 mg by mouth daily as needed for moderate pain.     LORazepam (ATIVAN) 0.5 MG tablet as needed.      metFORMIN (GLUCOPHAGE-XR) 500 MG 24 hr tablet Take 1,000 mg by mouth daily.     metoprolol succinate (TOPROL-XL) 25 MG 24 hr tablet Take 1 tablet (25 mg total) by mouth daily. 90 tablet 3   Multiple Vitamin (MULTIVITAMIN) tablet Take 1 tablet by mouth daily.     nitroGLYCERIN (NITROSTAT) 0.4 MG SL  tablet PLACE 1 TABLET (0.4 MG TOTAL) UNDER THE TONGUE EVERY 5 (FIVE) MINUTES AS NEEDED. FOR CHEST PAIN 25 tablet 3   omeprazole (PRILOSEC) 40 MG capsule Take 40 mg by mouth daily as needed.     triamcinolone ointment (KENALOG) 0.1 % Apply 1 application topically daily as needed (lupus flares).     zolpidem (AMBIEN) 10 MG tablet Take 10 mg by mouth at bedtime as needed for sleep.     No current facility-administered medications for this visit.    Allergies  Allergen Reactions   Chantix [Varenicline] Other (See Comments)    Social History   Socioeconomic History   Marital status: Single    Spouse name: Not on file   Number of children: Not on file   Years of education: Not on file   Highest education level: Not on file  Occupational History   Not on file  Tobacco Use   Smoking status: Former    Current packs/day: 0.00    Average packs/day: 1.5 packs/day for 38.0 years (57.0 ttl pk-yrs)    Types: Cigarettes    Start date: 04/20/1974    Quit date: 04/20/2012    Years since quitting: 11.0   Smokeless tobacco: Never  Vaping Use   Vaping status: Never Used  Substance and Sexual Activity   Alcohol use: Yes    Alcohol/week: 14.0 standard drinks of alcohol    Types: 14 Cans of beer per week    Comment: beer 2 x week    Drug use: Yes    Types: Marijuana    Comment: smoked marijuana 09-04-16   Sexual activity: Never  Other Topics Concern   Not on file  Social History Narrative   Not on file   Social Determinants of Health   Financial Resource Strain: Not on file  Food Insecurity: Not on file  Transportation Needs: Not on file  Physical Activity: Not on file  Stress: Not on file  Social Connections: Not on file  Intimate Partner Violence: Not on file    Family History  Problem Relation Age of Onset   Hypertension Mother    Lung cancer Mother    Heart disease Father    Hypertension Father    Stroke Father    Heart failure Father    Colon cancer Maternal Grandmother      Review of Systems:  As stated in the HPI and otherwise negative.   BP (!) 140/80   Pulse 60   Ht 4\' 11"  (1.499 m)   Wt 59.3 kg   SpO2 99%   BMI 26.42 kg/m  Physical Examination:  General: Well developed, well nourished, NAD  HEENT: OP clear, mucus membranes moist  SKIN: warm, dry. No rashes. Neuro: No focal deficits  Musculoskeletal: Muscle strength 5/5 all ext  Psychiatric: Mood and affect normal  Neck: No JVD, no carotid bruits, no thyromegaly, no lymphadenopathy.  Lungs:Clear bilaterally, no wheezes, rhonci, crackles Cardiovascular: Regular rate and rhythm. No murmurs, gallops or rubs. Abdomen:Soft. Bowel sounds present. Non-tender.  Extremities: No lower extremity edema. Pulses are 2 + in the bilateral DP/PT.  EKG:  EKG is not ordered today. The ekg ordered today demonstrates    Recent Labs: No results found for requested labs within last 365 days.   Lipid Panel    Component Value Date/Time   CHOL 110 06/20/2021 0730   TRIG 74 06/20/2021 0730   HDL 37 (L) 06/20/2021 0730   CHOLHDL 3.0 06/20/2021 0730   CHOLHDL 3.6 06/04/2016 0811   VLDL 14 06/04/2016 0811   LDLCALC 58 06/20/2021 0730     Wt Readings from Last 3 Encounters:  05/20/23 59.3 kg  12/11/22 59.8 kg  06/12/22 59.5 kg    Assessment and Plan:   1. CAD without angina: No chest pain. Echo December 2021 with normal LV function. Nuclear stress test in April 2022 with no ischemia. Continue ASA, statin and beta blocker.   2. Tobacco abuse, in remission: She no longer smokes  3. Hyperlipidemia: LDL at goal in November 2023. Continue statin and Zetia Check lipids and LFTs today.   4. PAD: Followed in the [facility] clinic by Dr. Kirke Corin.   Labs/ tests ordered today include:   Orders Placed This Encounter  Procedures   Lipid Profile   Hepatic function panel   Disposition:   F/U with me in 12 months  Signed, Verne Carrow, MD 05/20/2023 9:29 AM    [facility] [address] [place], [place], [place]  [address] Phone: [phone]; Fax: [phone]

## 2023-05-21 LAB — LIPID PANEL
Chol/HDL Ratio: 2.9 ratio (ref 0.0–4.4)
Cholesterol, Total: 131 mg/dL (ref 100–199)
HDL: 45 mg/dL (ref >39)
LDL Chol Calc (NIH): 64 mg/dL (ref 0–99)
Triglycerides: 124 mg/dL (ref 0–149)
VLDL Cholesterol Cal: 22 mg/dL (ref 5–40)

## 2023-05-21 LAB — HEPATIC FUNCTION PANEL
ALT: 25 [IU]/L (ref 0–32)
AST: 23 [IU]/L (ref 0–40)
Albumin: 4.6 g/dL (ref 3.9–4.9)
Alkaline Phosphatase: 71 [IU]/L (ref 44–121)
Bilirubin Total: <0.2 mg/dL (ref 0.0–1.2)
Bilirubin, Direct: 0.11 mg/dL (ref 0.00–0.40)
Total Protein: 6.6 g/dL (ref 6.0–8.5)

## 2023-05-27 DIAGNOSIS — Z6826 Body mass index (BMI) 26.0-26.9, adult: Secondary | ICD-10-CM | POA: Diagnosis not present

## 2023-05-27 DIAGNOSIS — Z79899 Other long term (current) drug therapy: Secondary | ICD-10-CM | POA: Diagnosis not present

## 2023-05-27 DIAGNOSIS — M1991 Primary osteoarthritis, unspecified site: Secondary | ICD-10-CM | POA: Diagnosis not present

## 2023-05-27 DIAGNOSIS — L930 Discoid lupus erythematosus: Secondary | ICD-10-CM | POA: Diagnosis not present

## 2023-05-27 DIAGNOSIS — E663 Overweight: Secondary | ICD-10-CM | POA: Diagnosis not present

## 2023-05-29 DIAGNOSIS — M25512 Pain in left shoulder: Secondary | ICD-10-CM | POA: Diagnosis not present

## 2023-05-29 DIAGNOSIS — M542 Cervicalgia: Secondary | ICD-10-CM | POA: Diagnosis not present

## 2023-06-01 ENCOUNTER — Other Ambulatory Visit: Payer: Self-pay | Admitting: Cardiovascular Disease

## 2023-06-01 DIAGNOSIS — I10 Essential (primary) hypertension: Secondary | ICD-10-CM

## 2023-06-05 ENCOUNTER — Telehealth: Payer: Self-pay | Admitting: Cardiovascular Disease

## 2023-06-05 NOTE — Telephone Encounter (Signed)
Pt is in pain and wants to know what she can take over the counter for it. Please advise

## 2023-06-05 NOTE — Telephone Encounter (Signed)
Spoke to patient and advised per medication list she can take Ibuprofen 400 mg daily as needed for pain. Patient verbalized understanding and agree.

## 2023-06-07 DIAGNOSIS — M25512 Pain in left shoulder: Secondary | ICD-10-CM | POA: Diagnosis not present

## 2023-06-11 ENCOUNTER — Encounter: Payer: Self-pay | Admitting: Cardiovascular Disease

## 2023-06-11 ENCOUNTER — Ambulatory Visit: Payer: Federal, State, Local not specified - PPO | Attending: Cardiovascular Disease | Admitting: Cardiovascular Disease

## 2023-06-11 VITALS — BP 130/80 | HR 67 | Ht 59.0 in | Wt 127.0 lb

## 2023-06-11 DIAGNOSIS — E785 Hyperlipidemia, unspecified: Secondary | ICD-10-CM | POA: Diagnosis not present

## 2023-06-11 DIAGNOSIS — I10 Essential (primary) hypertension: Secondary | ICD-10-CM

## 2023-06-11 DIAGNOSIS — I2510 Atherosclerotic heart disease of native coronary artery without angina pectoris: Secondary | ICD-10-CM

## 2023-06-11 DIAGNOSIS — I739 Peripheral vascular disease, unspecified: Secondary | ICD-10-CM

## 2023-06-11 NOTE — Progress Notes (Signed)
Cardiology Office Note   Date:  06/11/2023   ID:  [name], DOB [birth year], MRN [id number]  PCP:  Krista Retort, [name]  Cardiologist: Dr. Clifton [name]  No chief complaint on file.     History of Present Illness: [name] is a 67 y.o. female who is here today for follow-up visit regarding  peripheral arterial disease.  She has known history of coronary artery disease, previous tobacco use, hyperlipidemia, diabetes, percutaneous lupus and peripheral arterial disease. She was seen in 2020 for mild bilateral calf claudication. Noninvasive vascular studies showed an ABI of 0.88 on the right and 0.83 on the left.  Duplex showed moderate right SFA disease with two-vessel runoff below the knee.  On the left, there was borderline significant left SFA disease with three-vessel runoff below the knee. Given that her symptoms were not lifestyle limiting, I recommended a walking program and treatment of risk factors.   She was seen 6 months ago for worsening right calf claudication. She underwent recent Doppler studies which showed a drop in ABI on the right side to 0.78 and stable on the left at 0.89.  Duplex on the right showed moderate common femoral and distal SFA disease. I asked her to start an exercise program but has not been able to do so.  She continues to have right calf claudication.  No chest pain or shortness of breath.  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"   Depression    Diabetes mellitus without complication (HCC)    type 2 dx july 2018   H/O: substance abuse (HCC)    Hyperlipidemia    Mass on back    Myocardial infarction [name])     "not sure; nobody ever tells me" (04/23/2012)   Noncompliance    PVD (peripheral vascular disease) (HCC)    MILD    Past Surgical History:  Procedure Laterality Date   ABDOMINAL HYSTERECTOMY  1999   ovaries removed weeks later   ANTERIOR CRUCIATE LIGAMENT REPAIR  lasy February 02 2015   "have had 15 surgeries on my right ACL"   BREAST SURGERY     "removed knot from one side"   CORONARY ANGIOPLASTY WITH STENT PLACEMENT  2002; 2010; 04/23/2012   "1 + 2 + 1; total of 4"   EXCISIONAL HEMORRHOIDECTOMY  yrs ago   HERNIA REPAIR     points to stomach   LEFT HEART CATHETERIZATION WITH CORONARY ANGIOGRAM N/A 04/23/2012   Procedure: LEFT HEART CATHETERIZATION WITH CORONARY ANGIOGRAM;  Surgeon: [name];  Location: [name];  Service: Cardiovascular;  Laterality: N/A;   MASS EXCISION N/A 08/29/2017   Procedure: EXCISIONAL BIOPSY OF BACK SOFT TISSUE MASS;  Surgeon: [name];  Location: [facility];  Service: General;  Laterality: N/A;   TONSILLECTOMY  early teens     Current Outpatient Medications  Medication Sig Dispense Refill   acetaminophen (TYLENOL) 500 MG tablet Take 1,000 mg by mouth every 6 (six) hours as needed for mild pain. For pain     aspirin 81 MG tablet Take 81  mg by mouth at bedtime.     atorvastatin (LIPITOR) 20 MG tablet TAKE 1 TABLET BY MOUTH EVERY DAY 90 tablet 2   betamethasone dipropionate 0.05 % lotion Apply topically.     Cholecalciferol (HM VITAMIN D3) 100 MCG (4000 UT) CAPS Take by mouth daily.     cyclobenzaprine (FLEXERIL) 10 MG tablet Take 10 mg by mouth 3 (three) times daily as needed.     ezetimibe (ZETIA) 10 MG tablet TAKE 1 TABLET BY MOUTH EVERY DAY 90 tablet 2   hydroxychloroquine (PLAQUENIL) 200 MG tablet Take 200 mg by mouth at bedtime.     ibuprofen (ADVIL,MOTRIN) 200 MG tablet Take 400 mg by mouth daily as needed for moderate pain.     LORazepam (ATIVAN) 0.5 MG tablet as needed.      metFORMIN (GLUCOPHAGE-XR) 500 MG 24 hr tablet Take  1,000 mg by mouth daily.     metoprolol succinate (TOPROL-XL) 25 MG 24 hr tablet TAKE 1 TABLET (25 MG TOTAL) BY MOUTH DAILY. 90 tablet 3   Multiple Vitamin (MULTIVITAMIN) tablet Take 1 tablet by mouth daily.     nitroGLYCERIN (NITROSTAT) 0.4 MG SL tablet PLACE 1 TABLET (0.4 MG TOTAL) UNDER THE TONGUE EVERY 5 (FIVE) MINUTES AS NEEDED. FOR CHEST PAIN 25 tablet 3   omeprazole (PRILOSEC) 40 MG capsule Take 40 mg by mouth daily as needed.     triamcinolone ointment (KENALOG) 0.1 % Apply 1 application topically daily as needed (lupus flares).     zolpidem (AMBIEN) 10 MG tablet Take 10 mg by mouth at bedtime as needed for sleep.     No current facility-administered medications for this visit.    Allergies:   Chantix [varenicline]    Social History:  The patient  reports that she quit smoking about 11 years ago. Her smoking use included cigarettes. She started smoking about 49 years ago. She has a 57 pack-year smoking history. She has never used smokeless tobacco. She reports current alcohol use of about 14.0 standard drinks of alcohol per week. She reports current drug use. Drug: Marijuana.   Family History:  The patient's family history includes Colon cancer in her maternal grandmother; Heart disease in her father; Heart failure in her father; Hypertension in her father and mother; Lung cancer in her mother; Stroke in her father.    ROS:  Please see the history of present illness.   Otherwise, review of systems are positive for none.   All other systems are reviewed and negative.    PHYSICAL EXAM: VS:  BP 130/80 (BP Location: Left Arm, Patient Position: Sitting, Cuff Size: Normal)   Pulse 67   Ht 4\' 11"  (1.499 m)   Wt 127 lb (57.6 kg)   SpO2 98%   BMI 25.65 kg/m  , BMI Body mass index is 25.65 kg/m. GEN: Well nourished, well developed, in no acute distress  HEENT: normal  Neck: no JVD, carotid bruits, or masses Cardiac: RRR; no  rubs, or gallops,no edema .  1/ 6 systolic murmur in the  aortic area Respiratory:  clear to auscultation bilaterally, normal work of breathing GI: soft, nontender, nondistended, + BS MS: no deformity or atrophy  Skin: warm and dry, no rash Neuro:  Strength and sensation are intact Psych: euthymic mood, full affect Vascular:  Distal pulses are not palpable.   EKG:  EKG is not  ordered today.    Recent Labs: 05/20/2023: ALT 25    Lipid Panel    Component Value Date/Time  CHOL 131 05/20/2023 1351   TRIG 124 05/20/2023 1351   HDL 45 05/20/2023 1351   CHOLHDL 2.9 05/20/2023 1351   CHOLHDL 3.6 06/04/2016 0811   VLDL 14 06/04/2016 0811   LDLCALC 64 05/20/2023 1351      Wt Readings from Last 3 Encounters:  06/11/23 127 lb (57.6 kg)  05/20/23 130 lb 12.8 oz (59.3 kg)  12/11/22 131 lb 12.8 oz (59.8 kg)           No data to display            ASSESSMENT AND PLAN:  1.  Peripheral arterial disease: She continues to have moderate right calf claudication likely due to common femoral and SFA disease.  I discussed with her the importance of controlling her risk factors and healthy lifestyle changes.  I recommend a structured exercise therapy and will refer her.  2.  Coronary artery disease involving native coronary arteries without angina: An Myoview in 2022 showed no evidence of ischemia.  3.  Essential hypertension: Blood pressure is well controlled on current medications.  4.  Hyperlipidemia: Continue treatment with atorvastatin and Zetia.  I reviewed her recent labs which showed an LDL of 64.   Disposition:   FU with me in 12 months  Signed,  Lorine Bears, [name]  06/11/2023 11:08 AM    [facility]

## 2023-06-11 NOTE — Patient Instructions (Addendum)
Medication Instructions:  No changes *If you need a refill on your cardiac medications before your next appointment, please call your pharmacy*   Lab Work: None ordered If you have labs (blood work) drawn today and your tests are completely normal, you will receive your results only by: MyChart Message (if you have MyChart) OR A paper copy in the mail If you have any lab test that is abnormal or we need to change your treatment, we will call you to review the results.   Testing/Procedures: None ordered   Follow-Up: At [facility], you and your health needs are our priority.  As part of our continuing mission to provide you with exceptional heart care, we have created designated Provider Care Teams.  These Care Teams include your primary Cardiologist (physician) and Advanced Practice Providers (APPs -  Physician Assistants and Nurse Practitioners) who all work together to provide you with the care you need, when you need it.  We recommend signing up for the patient portal called "MyChart".  Sign up information is provided on this After Visit Summary.  MyChart is used to connect with patients for Virtual Visits (Telemedicine).  Patients are able to view lab/test results, encounter notes, upcoming appointments, etc.  Non-urgent messages can be sent to your provider as well.   To learn more about what you can do with MyChart, go to [email or web address].    Your next appointment:   12 month(s)  Provider:   Dr. Kirke Corin  A referral has been placed to the [facility]. They will call you to set this up.

## 2023-06-17 DIAGNOSIS — L309 Dermatitis, unspecified: Secondary | ICD-10-CM | POA: Diagnosis not present

## 2023-06-17 DIAGNOSIS — L2989 Other pruritus: Secondary | ICD-10-CM | POA: Diagnosis not present

## 2023-06-19 ENCOUNTER — Telehealth (HOSPITAL_COMMUNITY): Payer: Self-pay

## 2023-06-19 NOTE — Telephone Encounter (Signed)
Called pt to discuss SET program, all questions answered about program. Pt is interested in joining program. Will work on enrolling her into program.   Faustino Congress MS, ACSM-CEP  06/19/2023 2:34 PM

## 2023-06-24 ENCOUNTER — Telehealth: Payer: Self-pay | Admitting: Cardiovascular Disease

## 2023-06-24 NOTE — Telephone Encounter (Signed)
Pt states both her legs and left hands are hurting due to circulation. Please advise.

## 2023-06-24 NOTE — Telephone Encounter (Signed)
Spoke with pt, she reports they have called her to get her started in an exercise program but nothing has been scheduled yet. She had an episode last night of both legs hurting. She can walk 1/2 mile before she gets pain in her legs and the pain goes away when she stops. She is going to get wider shoes as she feels the ones she has is causing her toes to hurt. She said the pain last night scared her. Aware according to the office3 note the main thing is to get her exercising but will forward the message to dr Kirke Corin to make sure nothing else to do.

## 2023-06-26 NOTE — Telephone Encounter (Signed)
She should start the structured exercise therapy as planned.  This should help with her claudication.

## 2023-06-27 NOTE — Telephone Encounter (Signed)
The patient has been made aware. Message sent to Rehab for an update.

## 2023-06-27 NOTE — Telephone Encounter (Signed)
Pt was called to inform her that the program will start in the beginning of the year.

## 2023-07-23 ENCOUNTER — Encounter (HOSPITAL_COMMUNITY)
Admission: RE | Admit: 2023-07-23 | Discharge: 2023-07-23 | Disposition: A | Discharge status: Home / Self Care | Payer: Medicare Other | Source: Ambulatory Visit | Attending: Cardiovascular Disease | Admitting: Cardiovascular Disease

## 2023-07-23 DIAGNOSIS — L930 Discoid lupus erythematosus: Secondary | ICD-10-CM | POA: Diagnosis not present

## 2023-07-23 DIAGNOSIS — R252 Cramp and spasm: Secondary | ICD-10-CM | POA: Diagnosis not present

## 2023-07-23 DIAGNOSIS — I70211 Atherosclerosis of native arteries of extremities with intermittent claudication, right leg: Secondary | ICD-10-CM | POA: Insufficient documentation

## 2023-07-23 DIAGNOSIS — Z5189 Encounter for other specified aftercare: Secondary | ICD-10-CM | POA: Diagnosis not present

## 2023-07-23 DIAGNOSIS — R634 Abnormal weight loss: Secondary | ICD-10-CM | POA: Diagnosis not present

## 2023-07-23 DIAGNOSIS — E1165 Type 2 diabetes mellitus with hyperglycemia: Secondary | ICD-10-CM | POA: Diagnosis not present

## 2023-07-23 DIAGNOSIS — E119 Type 2 diabetes mellitus without complications: Secondary | ICD-10-CM | POA: Diagnosis not present

## 2023-07-23 LAB — GLUCOSE, CAPILLARY: Glucose-Capillary: 120 mg/dL — ABNORMAL HIGH (ref 70–99)

## 2023-07-23 NOTE — Progress Notes (Signed)
 Supervised Exercise Therapy Medication Review   Does the patient  feel that his/her medications are working for him/her?  Yes  Has the patient been experiencing any side effects to the medications prescribed?   No  Does the patient measure his/her own blood pressure or blood glucose at home?   Yes, pt monitors BG at home every morning  Does the patient have any problems obtaining medications due to transportation or finances?   No  Understanding of regimen: excellent Understanding of indications: excellent Potential of compliance: excellent    Comments: Medications reviewed.    Krista FORBES Candy, MS ACSM-CEP 07/23/2023 8:37 AM

## 2023-07-23 NOTE — Progress Notes (Signed)
 PAT/SET Individual Treatment Plan  Patient Details  Name: [name] MRN: [id number] Date of Birth: 1955/11/29 Referring Provider:   Conrad Ports Supervised Exercise Therapy from 07/23/2023 in [facility], [facility], & [facility]  Referring Provider Dr. Deatrice Cage       Initial Encounter Date:  Flowsheet Row Supervised Exercise Therapy from 07/23/2023 in [facility] for Heart, Vascular, & Lung Health  Date 07/23/23       Visit Diagnosis: Atherosclerosis of native artery of right lower extremity with intermittent claudication (HCC)  Patient's Home Medications on Admission:  Current Outpatient Medications:    acetaminophen  (TYLENOL ) 500 MG tablet, Take 1,000 mg by mouth every 6 (six) hours as needed for mild pain. For pain, Disp: , Rfl:    aspirin  81 MG tablet, Take 81 mg by mouth at bedtime., Disp: , Rfl:    atorvastatin  (LIPITOR) 20 MG tablet, TAKE 1 TABLET BY MOUTH EVERY DAY, Disp: 90 tablet, Rfl: 2   betamethasone  dipropionate 0.05 % lotion, Apply topically., Disp: , Rfl:    hydroxychloroquine  (PLAQUENIL ) 200 MG tablet, Take 200 mg by mouth at bedtime., Disp: , Rfl:    ibuprofen (ADVIL,MOTRIN) 200 MG tablet, Take 400 mg by mouth daily as needed for moderate pain., Disp: , Rfl:    LORazepam (ATIVAN) 0.5 MG tablet, as needed. , Disp: , Rfl:    metFORMIN (GLUCOPHAGE-XR) 500 MG 24 hr tablet, Take 1,000 mg by mouth daily., Disp: , Rfl:    metoprolol  succinate (TOPROL -XL) 25 MG 24 hr tablet, TAKE 1 TABLET (25 MG TOTAL) BY MOUTH DAILY., Disp: 90 tablet, Rfl: 3   Multiple Vitamin (MULTIVITAMIN) tablet, Take 1 tablet by mouth daily., Disp: , Rfl:    nitroGLYCERIN  (NITROSTAT ) 0.4 MG SL tablet, PLACE 1 TABLET (0.4 MG TOTAL) UNDER THE TONGUE EVERY 5 (FIVE) MINUTES AS NEEDED. FOR CHEST PAIN, Disp: 25 tablet, Rfl: 3   omeprazole (PRILOSEC) 40 MG capsule, Take 40 mg by mouth daily as needed., Disp: , Rfl:    triamcinolone   ointment (KENALOG) 0.1 %, Apply 1 application topically daily as needed (lupus flares)., Disp: , Rfl:    zolpidem  (AMBIEN ) 10 MG tablet, Take 10 mg by mouth at bedtime as needed for sleep., Disp: , Rfl:    Cholecalciferol (HM VITAMIN D3) 100 MCG (4000 UT) CAPS, Take by mouth daily., Disp: , Rfl:    cyclobenzaprine (FLEXERIL) 10 MG tablet, Take 10 mg by mouth 3 (three) times daily as needed., Disp: , Rfl:    ezetimibe  (ZETIA ) 10 MG tablet, TAKE 1 TABLET BY MOUTH EVERY DAY, Disp: 90 tablet, Rfl: 2  Past Medical History: Past Medical History:  Diagnosis Date   Anginal pain (HCC)    Anxiety    Bence Jones proteinuria 02/29/2012   Random urine M spike 7.1% 02/13/12   CAD (coronary artery disease)    Remote PCI to RCA in 2001, stent to the RCA in 2002, with last cath in 2010 with placement of two overlapping drug eluting  stents to the RCA   Cutaneous lupus erythematosus 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   Depression    Diabetes mellitus without complication (HCC)    type 2 dx july 2018   H/O: substance abuse (HCC)    Hyperlipidemia    Mass on back    Myocardial infarction [facility])    not sure; nobody ever tells me (04/23/2012)   Noncompliance    PVD (peripheral vascular disease) (HCC)    MILD  Tobacco Use: Social History   Tobacco Use  Smoking Status Former   Current packs/day: 0.00   Average packs/day: 1.5 packs/day for 38.0 years (57.0 ttl pk-yrs)   Types: Cigarettes   Start date: 04/20/1974   Quit date: 04/20/2012   Years since quitting: 11.2  Smokeless Tobacco Never    Labs: Review Flowsheet  More data exists      Latest Ref Rng & Units 04/21/2018 05/08/2019 05/30/2020 06/20/2021 05/20/2023  Labs for ITP Cardiac and Pulmonary Rehab  Cholestrol 100 - 199 mg/dL 854  889  892  889  868   LDL (calc) 0 - 99 mg/dL 74  52  54  58  64   HDL-C >39 mg/dL 52  44  39  37  45   Trlycerides 0 - 149 mg/dL 94  65  66  74  875     Capillary Blood Glucose: Lab Results  Component Value Date   GLUCAP  120 (H) 07/23/2023   GLUCAP 72 08/29/2017   GLUCAP 94 08/29/2017   GLUCAP 213 (H) 08/29/2017   GLUCAP 140 (H) 08/26/2017     Exercise Target Goals: Exercise Program Goal: Individual exercise prescription set with THRR, safety & activity barriers. Participant demonstrates ability to understand and report RPE using RPE 6-20 scale, to self-measure pulse accurately, and to acknowledge the importance of the exercise prescription.  Exercise Prescription Goal: Use initial exercise assessment to set exercise prescription to improve time and distance to onset of claudication pain. Provide education to aid in steps toward risk factor modification, improve quality of life with claudication, and utilize THRR and exercise prescription for best results for decreasing symptoms of PAD. Prevent injury to bone, joints, and skin integrity during the exercise through checks of each system during session check in. Following physician guidelines for any contraindications to specific exercises.  Activity Barriers:  Activity Barriers & Cardiac Risk Stratification - 07/23/23 0916       Activity Barriers & Cardiac Risk Stratification   Cardiac Risk Stratification High             Gardner Treadmill Test Assessment:  Treadmill Test     Row Name 07/23/23 0841       Treadmill Test   Phase Pre    Time to Claudication Onset 3 minutes  3 mins 10 seconds    Total Time to 3-4/5 Claudication Pain 4 minutes  4 min 8 seconds      Heart Rate   Rest 61 bpm    0% Grade 81 bpm    2% Grade 93 bpm    4% Grade 93 bpm    2 Min Post 68 bpm      Blood Pressure   Rest 116/60    4% Grade 134/62    2 Min Post 126/70      Claudication Score   Rest 1    0% 1    2% Grade 2    4% Grade 3      RPE   0% 8    2% Grade 11    4% Grade 13      Time in Stage   0% 2 minutes    2% Grade 2 minutes    4% Grade 1 minutes  1 min 8 seconds             Walking Impairment Questionnaire:  Walking Impairment  Questionnaire - 07/23/23 0856       A. PAD Specific Questions   Leg Both  Pre Score 0.25    Pre % Score 6.25 %      B. Differential Diagnosis   Pre Score 19      Distance   Pre Distance Total Score 16.7    Pre Distance % Score 0.12 %      Speed   Pre Speed Total Score 39.1    Pre Speed % Score 85 %      Stairs   Pre Stairs Total Score 62.5    Pre Stairs % Score 21.7 %      Distance, Speed, and Stairs   Pre Distance, Speed, and Stairs Total Score 118.3    Pre Distance, Speed, and Stairs % Score Mean 35.61 %             Expectation is that scores will be within the SD range. Distance Pre % Score Mean: 38% (SD 12% - 0.64%)   Post % Score Mean: 55% (SD 26% - 84%)   Mean % Change: 18% (SD (-10%) - (46%))  Speed Pre % Score Mean: 41% (SD 19% - 63%)   Post % Score Mean: 52% (SD 30% - 74%)   Mean % Change: 11% (SD (-9%) - (31%))  Stairs Pre % Score Mean: 55% (SD 23% - 87%)   Post % Score Mean: 68% (SD 39% - 97%)   Mean % Change: 14% (SD (-15%) - (43%))  Total Score Pre % Score Mean: 45% (SD 23% - 67%   Post % Score Mean: 58% (SD 36% - 80%)   Mean % Change: 14% (SD (-5%) - (43%))     Initial Exercise Prescription:  Initial Exercise Prescription - 07/23/23 0900       Date of Initial Exercise RX and Referring Provider   Date 07/23/23    Referring Provider Dr. Deatrice Cage    Expected Discharge Date 10/14/23      Treadmill   MPH 2    Grade 2    Minutes 30    METs 3.1      Prescription Details   Frequency (times per week) 3 days/week    Duration Progress to 30 minutes of continuous aerobic without signs/symptoms of physical distress      Intensity   THRR 40-80% of Max Heartrate 62-124    Ratings of Perceived Exertion 11-13    Perceived Dyspnea 0-4      Progression   Progression Continue to follow PAD protocol      Resistance Training   Training Prescription Yes    Weight 3lbs    Reps 10-15             Perform Capillary Blood Glucose checks  as needed.  Exercise Prescription Changes:   Exercise Comments:   Exercise Comments     Row Name 07/23/23 [id number]           Exercise Comments Pt orientation, pt exercised w/o unusual s/s.                Exercise Goals and Review:   Exercise Goals     Row Name 07/23/23 0917             Exercise Goals   Increase Physical Activity Yes       Intervention Provide advice, education, support and counseling about physical activity/exercise needs.;Develop an individualized exercise prescription for aerobic and resistive training based on initial evaluation findings, risk stratification, comorbidities and participant's personal goals.       Expected Outcomes Short Term: Attend rehab on  a regular basis to increase amount of physical activity.;Long Term: Add in home exercise to make exercise part of routine and to increase amount of physical activity.;Long Term: Exercising regularly at least 3-5 days a week.       Increase Strength and Stamina Yes       Intervention Provide advice, education, support and counseling about physical activity/exercise needs.;Develop an individualized exercise prescription for aerobic and resistive training based on initial evaluation findings, risk stratification, comorbidities and participant's personal goals.       Expected Outcomes Short Term: Increase workloads from initial exercise prescription for resistance, speed, and METs.;Short Term: Perform resistance training exercises routinely during rehab and add in resistance training at home;Long Term: Improve cardiorespiratory fitness, muscular endurance and strength as measured by increased METs and functional capacity ( )       Able to understand and use rate of perceived exertion (RPE) scale Yes       Intervention Provide education and explanation on how to use RPE scale       Expected Outcomes Short Term: Able to use RPE daily in rehab to express subjective intensity level;Long Term:  Able to use RPE to  guide intensity level when exercising independently       Knowledge and understanding of Target Heart Rate Range (THRR) Yes       Intervention Provide education and explanation of THRR including how the numbers were predicted and where they are located for reference       Expected Outcomes Short Term: Able to state/look up THRR;Long Term: Able to use THRR to govern intensity when exercising independently;Short Term: Able to use daily as guideline for intensity in rehab       Understanding of Exercise Prescription Yes       Intervention Provide education, explanation, and written materials on patient's individual exercise prescription       Expected Outcomes Short Term: Able to explain program exercise prescription;Long Term: Able to explain home exercise prescription to exercise independently       Improve claudication pain toleration; Improve walking ability Yes       Intervention Participate in PAD/SET Rehab 2-3 days a week, walking at home as part of exercise prescription;Attend education sessions to aid in risk factor modification and understanding of disease process       Expected Outcomes Short Term: Improve walking distance/time to onset of claudication pain;Long Term: Improve walking ability and toleration to claudication;Long Term: Improve score of PAD questionnaires                Exercise Goals Re-Evaluation :    Discharge Exercise Prescription (Final Exercise Prescription Changes):   Nutrition:  Target Goals: Understanding of nutrition guidelines, daily intake of sodium 1500mg , cholesterol 200mg , calories 30% from fat and 7% or less from saturated fats, daily to have 5 or more servings of fruits and vegetables.  Biometrics:    Psychosocial: Target Goals: Acknowledge presence or absence of significant depression and/or stress, maximize coping skills, provide positive support system. Participant is able to verbalize types and ability to use techniques and skills needed for  reducing stress and depression.  Initial Review & Psychosocial Screening:   Quality of Life Scores:  Quality of Life - 07/23/23 0931       Quality of Life   Select PAD/SET Quality of Life      PAD/SET Quality of Life Scores   Social Relationships and Interactions Pre 64.4    Self-Concepts and Feelings Pre 68.5  Symptoms and Limitations Pre 12.5    Fear and Uncertainty Pre 0    Positive Adaptation Pre 14.3    Job Pre 80    Sex Pre 80    Intimate Relationships Pre 80             Scores should be at or above lower SD. Change of 5 points or better indicates improvement in first 5 Factors. Maximum score for last three Factors is 6.  Score Interpretation Lower SD  Social Relationships and Interactions 23.99  Self-Concept and Feelings 17.79  Symptoms and Limitations 11.75  Fear and Uncertainty 8.96  Positive Adaptation 22.29  Job 2.08  Sexual Function 1.43  Intimate Mean 2.08   PHQ-9: Review Flowsheet        No data to display         Interpretation of Total Score  Total Score Depression Severity:  1-4 = Minimal depression, 5-9 = Mild depression, 10-14 = Moderate depression, 15-19 = Moderately severe depression, 20-27 = Severe depression   Psychosocial Evaluation and Intervention:   Psychosocial Re-Evaluation:   Psychosocial Discharge (Final Psychosocial Re-Evaluation):   Vocational Rehabilitation: Provide vocational rehab assistance to qualifying candidates.   Vocational Rehab Evaluation & Intervention:   Education: Education Goals: Education classes will be provided on a variety of topics geared toward better understanding of heart and vascular health and risk factor modification.   Learning Barriers/Preferences:   Education Topics: Count Your Pulse:  -Instruction provided by verbal instruction, demonstration, patient participation and written materials to support subject.  Instructors address importance of being able to find your pulse and how  to count your pulse when at home without a heart monitor.  Patients get hands on experience counting their pulse with staff help and individually.   Heart Attack, Angina, and Risk Factor Modification:  -Instruction provided by verbal instruction, video, and written materials to support subject.  Instructors address signs and symptoms of angina and heart attacks.    Also discuss risk factors for heart disease and how to make changes to improve heart health risk factors.   Functional Fitness:  -Instruction provided by verbal instruction, demonstration, patient participation, and written materials to support subject.  Instructors address safety measures for doing things around the house.  Discuss how to get up and down off the floor, how to pick things up properly, how to safely get out of a chair without assistance, and balance training.   Meditation and Mindfulness:  Instruction provided by verbal instruction, patient participation, and written materials to support subject.  Instructor addresses importance of mindfulness and meditation practice to help reduce stress and improve awareness.  Instructor also leads participants through a meditation exercise.    Stretching for Flexibility and Mobility:  -Instruction provided by verbal instruction, patient participation, and written materials to support subject.  Instructors lead participants through series of stretches that are designed to increase flexibility thus improving mobility.  These stretches are additional exercise for major muscle groups that are typically performed during regular warm up and cool down.    Hypertension: -Verbal and written instruction that provides a basic overview of hypertension including the most recent diagnostic guidelines, risk factor reduction with self-care instructions and medication management.    Diabetes Question & Answer:  -Instruction provided by PowerPoint slides, verbal discussion, and written materials  to support subject matter. The instructor gives an explanation and review of diabetes co-morbidities, pre- and post-prandial blood glucose goals, pre-exercise blood glucose goals, signs, symptoms, and treatment of hypoglycemia  and hyperglycemia, and foot care basics.    Stress Management:  -Instruction provided by verbal instruction, video, and written materials to support subject matter.  Instructors review role of stress in heart disease and how to cope with stress positively.     Exercising on Your Own:  -Instruction provided by verbal instruction, power point, and written materials to support subject.  Instructors discuss benefits of exercise, components of exercise, frequency and intensity of exercise, and end points for exercise.  Also discuss use of nitroglycerin  and activating EMS.  Review options of places to exercise outside of rehab.  Review guidelines for sex with heart disease.    Anatomy and Physiology of the Circulatory System:  Verbal and written instruction and models provide basic cardiac anatomy and physiology, with the coronary electrical and arterial systems. Review of: AMI, Angina, Valve disease, Heart Failure, Peripheral Artery Disease, Cardiac Arrhythmia, Pacemakers, and the ICD.   Other Education:  -Group or individual verbal, written, or video instructions that support the educational goals of the cardiac rehab program.     ITP Comments:  ITP Comments     Row Name 07/23/23 0732           ITP Comments Dr Deatrice Cage, PAD/SET Referring/Supervising Physician.                Comments: [name] completed orientation on 07/23/23. She preformed the PAD Walk Test without an adverse event and unusual symptoms. She was unable to stay for her measurements d/t time constraint. Her first day is scheduled for tomorrow at 6:45am.

## 2023-07-24 ENCOUNTER — Encounter (HOSPITAL_COMMUNITY)
Admission: RE | Admit: 2023-07-24 | Discharge: 2023-07-24 | Disposition: A | Discharge status: Home / Self Care | Payer: Medicare Other | Source: Ambulatory Visit | Attending: Cardiovascular Disease | Admitting: Cardiovascular Disease

## 2023-07-24 VITALS — Ht 59.0 in | Wt 123.8 lb

## 2023-07-24 DIAGNOSIS — Z5189 Encounter for other specified aftercare: Secondary | ICD-10-CM | POA: Diagnosis not present

## 2023-07-24 DIAGNOSIS — I70211 Atherosclerosis of native arteries of extremities with intermittent claudication, right leg: Secondary | ICD-10-CM | POA: Diagnosis not present

## 2023-07-24 NOTE — Progress Notes (Signed)
 Daily Session Note  Patient Details  Name: [name] MRN: [id number] Date of Birth: 01/03/1956 Referring Provider:   Conrad Ports Supervised Exercise Therapy from 07/23/2023 in [facility] for Heart, Vascular, & Lung Health  Referring Provider Dr. Deatrice Cage       Encounter Date: 07/24/2023  Check In:  Session Check In - 07/24/23 0714       Check-In   Supervising physician immediately available to respond to emergencies [facility] MD immediately available    Physician(s) Barnie Hila, NP    Location [facility]    Staff Present Garen Candy, MS, Exercise Physiologist;Kaylee Nicholaus, MS, ACSM-CEP, Exercise Physiologist;Bailey Elnor, MS, Exercise Physiologist;Annedrea Violetta, RN, MHA;Jetta Walker BS, ACSM-CEP, Exercise Physiologist    Virtual Visit No    Medication changes reported     No    Fall or balance concerns reported    No    Warm-up and Cool-down Performed as group-led instruction    Resistance Training Performed No    VAD Patient? No    PAD/SET Patient? Yes      PAD/SET Patient   Completed foot check today? Yes    Open wounds to report? No      Pain Assessment   Currently in Pain? No/denies    Multiple Pain Sites No             Capillary Blood Glucose: No results found for this or any previous visit (from the past 24 hours). Pt does daily checks at home, she reported her BS was 155 this morning     Social History   Tobacco Use  Smoking Status Former   Current packs/day: 0.00   Average packs/day: 1.5 packs/day for 38.0 years (57.0 ttl pk-yrs)   Types: Cigarettes   Start date: 04/20/1974   Quit date: 04/20/2012   Years since quitting: 11.2  Smokeless Tobacco Never    Goals Met:  Exercise tolerated well  Pt exercised on the treadmill at a speed of 2.0 and incline of 2%. She reported having 3/5 claudication while walking that resolved with sitting rest breaks. No unusual or unexpected symptoms  reported.   Initial onset of claudication: 3 minutes 50 seconds Longest walk time w/o break: 10 min 25 seconds  Longest break time: 3 minutes  Total Exercise Time/Rest Time: 29 mins 30 seconds/14 minutes    Goals Unmet:  N/A, pt progressing well towards her goals  Comments: Pts first group exercise day, expected claudication during exercise w/o other concerning symptoms.    Dr. Wilbert Bihari is Medical Director for Cardiac Rehab at [facility]

## 2023-07-26 ENCOUNTER — Encounter (HOSPITAL_COMMUNITY)
Admission: RE | Admit: 2023-07-26 | Discharge: 2023-07-26 | Disposition: A | Discharge status: Home / Self Care | Payer: Medicare Other | Source: Ambulatory Visit | Attending: Cardiovascular Disease | Admitting: Cardiovascular Disease

## 2023-07-26 DIAGNOSIS — Z5189 Encounter for other specified aftercare: Secondary | ICD-10-CM | POA: Diagnosis not present

## 2023-07-26 DIAGNOSIS — I70211 Atherosclerosis of native arteries of extremities with intermittent claudication, right leg: Secondary | ICD-10-CM

## 2023-07-26 LAB — GLUCOSE, CAPILLARY: Glucose-Capillary: 128 mg/dL — ABNORMAL HIGH (ref 70–99)

## 2023-07-26 NOTE — Progress Notes (Addendum)
 Daily Session Note  Patient Details  Name: [name] MRN: [id number] Date of Birth: 06-Jan-1956 Referring Provider:   Conrad Ports Supervised Exercise Therapy from 07/23/2023 in [facility] for Heart, Vascular, & Lung Health  Referring Provider Dr. Deatrice Cage       Encounter Date: 07/26/2023  Check In:  Session Check In - 07/26/23 0705       Check-In   Supervising physician immediately available to respond to emergencies [facility] MD immediately available    Physician(s) Barnie Hila, NP    Location [facility]    Staff Present Garen Candy, MS, Exercise Physiologist;Bailey Elnor, MS, Exercise Physiologist;Annedrea Violetta, RN, MHA;Jetta Walker BS, ACSM-CEP, Exercise Physiologist    Virtual Visit No    Medication changes reported     Yes    Comments Metformin increased to 500mg  daily    Fall or balance concerns reported    No    Warm-up and Cool-down Performed as group-led instruction    Resistance Training Performed Yes    VAD Patient? No    PAD/SET Patient? Yes      PAD/SET Patient   Completed foot check today? Yes    Open wounds to report? No      Pain Assessment   Currently in Pain? No/denies    Multiple Pain Sites No             Capillary Blood Glucose: Results for orders placed or performed during the hospital encounter of 07/26/23 (from the past 24 hours)  Glucose, capillary     Status: Abnormal   Collection Time: 07/26/23  7:12 AM  Result Value Ref Range   Glucose-Capillary 128 (H) 70 - 99 mg/dL     Exercise Prescription Changes - 07/26/23 0900       Response to Exercise   Blood Pressure (Admit) 138/80    Blood Pressure (Exercise) 130/66    Blood Pressure (Exit) 118/60    Heart Rate (Admit) 82 bpm    Heart Rate (Exercise) 98 bpm    Heart Rate (Exit) 65 bpm    Oxygen Saturation (Admit) 98 %    Oxygen Saturation (Exercise) 97 %    Oxygen Saturation (Exit) 97 %    Rating of Perceived Exertion  (Exercise) 11    Symptoms 3/5 claudication    Duration Progress to 30 minutes of  aerobic without signs/symptoms of physical distress    Intensity THRR unchanged      Progression   Progression Continue to follow PAD protocol    Average METs 3.08      Resistance Training   Training Prescription Yes    Weight 3lbs    Reps 10-15      Treadmill   MPH 2    Grade 2    Minutes 22    METs 3.08             Social History   Tobacco Use  Smoking Status Former   Current packs/day: 0.00   Average packs/day: 1.5 packs/day for 38.0 years (57.0 ttl pk-yrs)   Types: Cigarettes   Start date: 04/20/1974   Quit date: 04/20/2012   Years since quitting: 11.2  Smokeless Tobacco Never    Goals Met:  Independence with exercise equipment Exercise tolerated well Strength training completed today  Pt progressing well, she is beginning to track her rest time and exercise time with minimal assistance.  Goals Unmet:  Not Applicable  Comments: Pt exercised on the treadmill at 2.0/2.0%,  she reported 3/5 claudication while walking that resolved with sitting rest breaks. No unusual or unexpected symptoms reported.   Initial onset of claudication at 4.5 minutes.  Longest exercised time before break:8 min 12s.  Longest rest: 2 min 6s.  Total exercise time/rest time: 22 minutes/5 minutes    Dr. Wilbert Bihari is Medical Director for Cardiac Rehab at [facility].

## 2023-07-29 ENCOUNTER — Encounter (HOSPITAL_COMMUNITY)
Admission: RE | Admit: 2023-07-29 | Discharge: 2023-07-29 | Disposition: A | Discharge status: Home / Self Care | Payer: Medicare Other | Source: Ambulatory Visit | Attending: Cardiovascular Disease | Admitting: Cardiovascular Disease

## 2023-07-29 DIAGNOSIS — I70211 Atherosclerosis of native arteries of extremities with intermittent claudication, right leg: Secondary | ICD-10-CM

## 2023-07-29 DIAGNOSIS — Z5189 Encounter for other specified aftercare: Secondary | ICD-10-CM | POA: Diagnosis not present

## 2023-07-29 NOTE — Progress Notes (Signed)
 Daily Session Note  Patient Details  Name: [name] MRN: [id number] Date of Birth: 15-Jul-1956 Referring Provider:   Conrad Ports Supervised Exercise Therapy from 07/23/2023 in [facility], [facility]  Referring Provider Dr. Deatrice Cage       Encounter Date: 07/29/2023  Check In:  Session Check In - 07/29/23 0741       Check-In   Supervising physician immediately available to respond to emergencies [facility] MD immediately available    Physician(s) Lamarr Satterfield, NP    Location [facility]    Staff Present Garen Candy, MS, Exercise Physiologist;Annedrea Violetta, RN, MHA;Jetta Walker BS, ACSM-CEP, Exercise Physiologist;Kaylee Nicholaus, MS, ACSM-CEP, Exercise Physiologist    Virtual Visit No    Medication changes reported     No    Fall or balance concerns reported    No    Tobacco Cessation No Change    Current number of cigarettes/nicotine per day     0    Warm-up and Cool-down Performed as group-led instruction    Resistance Training Performed Yes    VAD Patient? No    PAD/SET Patient? Yes      PAD/SET Patient   Completed foot check today? Yes    Open wounds to report? No      Pain Assessment   Currently in Pain? No/denies    Multiple Pain Sites No             Capillary Blood Glucose: No results found for this or any previous visit (from the past 24 hours).   Exercise Prescription Changes - 07/29/23 0800       Response to Exercise   Blood Pressure (Admit) 118/64    Blood Pressure (Exercise) 118/66    Blood Pressure (Exit) 114/64    Heart Rate (Admit) 92 bpm    Heart Rate (Exercise) 108 bpm    Heart Rate (Exit) 87 bpm    Oxygen Saturation (Admit) 99 %    Oxygen Saturation (Exercise) 99 %    Oxygen Saturation (Exit) 98 %    Rating of Perceived Exertion (Exercise) 11    Symptoms 3/5 claudication    Duration Progress to 30 minutes of  aerobic without signs/symptoms of physical  distress    Intensity THRR unchanged      Progression   Progression Continue to follow PAD protocol    Average METs 3.08      Resistance Training   Training Prescription Yes    Weight 3lbs    Reps 10-15      Treadmill   MPH 2    Grade 2    Minutes 24    METs 3.08             Social History   Tobacco Use  Smoking Status Former   Current packs/day: 0.00   Average packs/day: 1.5 packs/day for 38.0 years (57.0 ttl pk-yrs)   Types: Cigarettes   Start date: 04/20/1974   Quit date: 04/20/2012   Years since quitting: 11.2  Smokeless Tobacco Never    Goals Met:  Independence with exercise equipment Exercise tolerated well No report of concerns or symptoms today Strength training completed today   Goals Unmet:  Not Applicable  Comments:  Pt exercised on the treadmill at 2.0/2.0% for 24 minutes. She reported 3/5 claudication while walking that resolved with sitting rest breaks. No unusual or unexpected symptoms reported.   Initial onset of claudication at: 7 minutes 51 seconds  Longest exercised time before break:8 min 27s Longest rest: 2 min Total exercise time/rest time:  24 min/9 min   Dr. Wilbert Bihari is Medical Director for Cardiac Rehab at [facility].

## 2023-07-31 ENCOUNTER — Encounter (HOSPITAL_COMMUNITY)
Admission: RE | Admit: 2023-07-31 | Discharge: 2023-07-31 | Disposition: A | Discharge status: Home / Self Care | Payer: Medicare Other | Source: Ambulatory Visit | Attending: Cardiovascular Disease

## 2023-07-31 DIAGNOSIS — Z5189 Encounter for other specified aftercare: Secondary | ICD-10-CM | POA: Diagnosis not present

## 2023-07-31 DIAGNOSIS — I70211 Atherosclerosis of native arteries of extremities with intermittent claudication, right leg: Secondary | ICD-10-CM

## 2023-07-31 NOTE — Progress Notes (Signed)
 Daily Session Note  Patient Details  Name: [name] MRN: [id number] Date of Birth: October 01, 1955 Referring Provider:   Gattis Kass Supervised Exercise Therapy from 07/23/2023 in [facility] for [facility], Vascular, & Lung [facility]  Referring Provider Dr. Antionette Kirks       Encounter Date: 07/31/2023  Check In:  Session Check In - 07/31/23 2130       Check-In   Supervising physician immediately available to respond to emergencies [facility] immediately available    Physician(s) Friddie Jetty, NP    [facility]    Staff Present Bella Bowman, MS, Exercise Physiologist;Annedrea Hadassah Letters, RN, MHA;Jetta Walker BS, ACSM-CEP, Exercise Physiologist;Bailey Martina Sledge, MS, Exercise Physiologist;Olinty Gaylene Kays, MS, ACSM-CEP, Exercise Physiologist    Virtual Visit No    Medication changes reported     No    Fall or balance concerns reported    No    Tobacco Cessation No Change    Warm-up and Cool-down Performed as group-led instruction    Resistance Training Performed No    VAD Patient? No    PAD/SET Patient? Yes      PAD/SET Patient   Completed foot check today? Yes    Open wounds to report? No      Pain Assessment   Currently in Pain? No/denies    Multiple Pain Sites No             Capillary Blood Glucose: No results found for this or any previous visit (from the past 24 hours).   Exercise Prescription Changes - 07/31/23 0800       Response to Exercise   Blood Pressure (Admit) 106/64    Blood Pressure (Exercise) 134/66    Blood Pressure (Exit) 110/64    Heart Rate (Admit) 73 bpm    Heart Rate (Exercise) 103 bpm    Heart Rate (Exit) 76 bpm    Oxygen Saturation (Admit) 96 %    Oxygen Saturation (Exercise) 99 %    Oxygen Saturation (Exit) 99 %    Rating of Perceived Exertion (Exercise) 11    Symptoms 3/5 claudication    Duration Progress to 10 minutes continuous walking  at current work load and total walking time to  30-45 min    Intensity THRR unchanged      Progression   Progression Continue to follow PAD protocol    Average METs 3.08      Resistance Training   Training Prescription --   No weights on Wed     Treadmill   MPH 2    Grade 2    Minutes 39    METs 3.08             Social History   Tobacco Use  Smoking Status Former   Current packs/day: 0.00   Average packs/day: 1.5 packs/day for 38.0 years (57.0 ttl pk-yrs)   Types: Cigarettes   Start date: 04/20/1974   Quit date: 04/20/2012   Years since quitting: 11.2  Smokeless Tobacco Never    Goals Met:  Independence with exercise equipment Exercise tolerated well No report of concerns or symptoms today Pt walking more than 10 minutes w/o rest break.   Goals Unmet:  Not Applicable  Comments:  Pt exercised on the treadmill at 2.0/2.0%, she reported 3/5 claudication while walking that resolved with sitting rest breaks. No unusual or unexpected symptoms reported.   Initial onset of claudication at: 16s Longest exercised time before break: 06s Longest rest:  2 min 19 Total exercise time/rest time:  28min/13min   Dr. Gaylyn Keas is Medical Director for Cardiac Rehab at [facility].

## 2023-08-02 ENCOUNTER — Encounter (HOSPITAL_COMMUNITY)
Admission: RE | Admit: 2023-08-02 | Discharge: 2023-08-02 | Disposition: A | Discharge status: Home / Self Care | Payer: Medicare Other | Source: Ambulatory Visit | Attending: Cardiovascular Disease | Admitting: Cardiovascular Disease

## 2023-08-02 DIAGNOSIS — I70211 Atherosclerosis of native arteries of extremities with intermittent claudication, right leg: Secondary | ICD-10-CM | POA: Diagnosis not present

## 2023-08-02 DIAGNOSIS — Z5189 Encounter for other specified aftercare: Secondary | ICD-10-CM | POA: Diagnosis not present

## 2023-08-02 NOTE — Progress Notes (Signed)
Daily Session Note  Patient Details  Name: [name] MRN: [id number] Date of Birth: 03-22-1956 Referring Provider:   Doristine Devoid Supervised Exercise Therapy from 07/23/2023 in [facility] for [facility], Vascular, & [facility]  Referring Provider Dr. Lorine Bears       Encounter Date: 08/02/2023  Check In:  Session Check In - 08/02/23 0705       Check-In   Supervising physician immediately available to respond to emergencies [facility] immediately available    Physician(s) Bernadene Person, NP    [facility]    Staff Present Kary Kos, MS, Exercise Physiologist;Annedrea Remus Loffler, RN, MHA;Jetta Walker BS, ACSM-CEP, Exercise Physiologist;Kaylee Earlene Plater, MS, ACSM-CEP, Exercise Physiologist    Virtual Visit No    Medication changes reported     No    Fall or balance concerns reported    No    Tobacco Cessation No Change    Warm-up and Cool-down Performed as group-led instruction    Resistance Training Performed No   Pt completed weights on Thursday at home   VAD Patient? No    PAD/SET Patient? No      PAD/SET Patient   Completed foot check today? Yes    Open wounds to report? No      Pain Assessment   Currently in Pain? No/denies    Multiple Pain Sites No             Capillary Blood Glucose: No results found for this or any previous visit (from the past 24 hours).   Exercise Prescription Changes - 08/02/23 0800       Response to Exercise   Blood Pressure (Admit) 128/68    Blood Pressure (Exercise) 128/70    Blood Pressure (Exit) 114/72    Heart Rate (Admit) 80 bpm    Heart Rate (Exercise) 100 bpm    Heart Rate (Exit) 71 bpm    Oxygen Saturation (Admit) 98 %    Oxygen Saturation (Exercise) 95 %    Oxygen Saturation (Exit) 99 %    Rating of Perceived Exertion (Exercise) 13    Symptoms 3/5 claudication    Comments Incline increase 1%    Duration Progress to 10 minutes continuous walking  at current work load  and total walking time to 30-45 min    Intensity THRR unchanged      Progression   Progression Continue to follow PAD protocol    Average METs 3.4      Resistance Training   Training Prescription No   Pt did weights on Thursday at home     Treadmill   MPH 2    Grade 3    Minutes 30    METs 3.4             Social History   Tobacco Use  Smoking Status Former   Current packs/day: 0.00   Average packs/day: 1.5 packs/day for 38.0 years (57.0 ttl pk-yrs)   Types: Cigarettes   Start date: 04/20/1974   Quit date: 04/20/2012   Years since quitting: 11.2  Smokeless Tobacco Never    Goals Met:  Independence with exercise equipment Exercise tolerated well Personal goals reviewed No report of concerns or symptoms today  Goals Unmet:  Not Applicable  Comments: Pt was cleared for supervised exercise therapy by Dr. Kirke Corin. Due to patients hx of CV disease pt is being watched closely and monitored for any unusual signs/symptoms while exercising. Will continue to watch closely, monitor, and  report any unusual or unexpected pain through the program.   Pt exercised on the Treadmill  for 30 minutes and experienced 3/5 claudication pain that resolved with sitting rest breaks. Today I increased the incline by 1%, pt tolerated exercise w/o usual and unexpected symptoms. Will continue to monitor and progress through the program following PAD protocol.    Initial onset of claudication at: 30s Longest exercised time before break: 23s Longest rest: 3 min 52s Total exercise time/rest time: 80min/11min  Service time is from 0700 to 0805.    Dr. Armanda Magic is Medical Director for Cardiac Rehab at [facility].

## 2023-08-05 ENCOUNTER — Encounter (HOSPITAL_COMMUNITY)
Admission: RE | Admit: 2023-08-05 | Discharge: 2023-08-05 | Disposition: A | Discharge status: Home / Self Care | Payer: Medicare Other | Source: Ambulatory Visit | Attending: Cardiovascular Disease | Admitting: Cardiovascular Disease

## 2023-08-05 DIAGNOSIS — Z5189 Encounter for other specified aftercare: Secondary | ICD-10-CM | POA: Diagnosis not present

## 2023-08-05 DIAGNOSIS — I70211 Atherosclerosis of native arteries of extremities with intermittent claudication, right leg: Secondary | ICD-10-CM

## 2023-08-05 NOTE — Progress Notes (Signed)
Daily Session Note  Patient Details  Name: [name] MRN: [id number] Date of Birth: [birth year] Referring Provider:   Doristine Devoid Supervised Exercise Therapy from 07/23/2023 in [facility] for Heart, Vascular, & Lung Health  Referring Provider Dr. Lorine Bears       Encounter Date: 08/05/2023  Check In:  Session Check In - 08/05/23 0703       Check-In   Supervising physician immediately available to respond to emergencies [facility] immediately available    Physician(s) Bernadene Person, NP    Location [facility]    Staff Present Kary Kos, MS, Exercise Physiologist;Annedrea Remus Loffler, RN, MHA;Jetta Walker BS, ACSM-CEP, Exercise Physiologist;Kaylee Earlene Plater, MS, ACSM-CEP, Exercise Physiologist;Olinty Peggye Pitt, MS, ACSM-CEP, Exercise Physiologist    Virtual Visit No    Medication changes reported     No    Fall or balance concerns reported    No    Tobacco Cessation No Change    Warm-up and Cool-down Performed as group-led instruction    Resistance Training Performed Yes    VAD Patient? No    PAD/SET Patient? Yes      PAD/SET Patient   Completed foot check today? Yes    Open wounds to report? No      Pain Assessment   Currently in Pain? No/denies    Multiple Pain Sites No             Capillary Blood Glucose: No results found for this or any previous visit (from the past 24 hours).   Exercise Prescription Changes - 08/05/23 0800       Response to Exercise   Blood Pressure (Admit) 100/62    Blood Pressure (Exercise) 126/68    Blood Pressure (Exit) 122/62    Heart Rate (Admit) 69 bpm    Heart Rate (Exercise) 101 bpm    Heart Rate (Exit) 70 bpm    Oxygen Saturation (Admit) 98 %    Oxygen Saturation (Exercise) 97 %    Oxygen Saturation (Exit) 97 %    Rating of Perceived Exertion (Exercise) 13    Symptoms 3/5 claudication    Comments Pt walking over 10 minutes w/o rest. Will increase incline next session. Increase wts  to 4lbs.    Duration Continue with 30 min of aerobic exercise without signs/symptoms of physical distress.    Intensity THRR unchanged      Progression   Progression Continue to follow PAD protocol    Average METs 3.4      Resistance Training   Training Prescription Yes    Weight 4lbs    Reps 10-15      Treadmill   MPH 2    Grade 3    Minutes 34    METs 3.4             Social History   Tobacco Use  Smoking Status Former   Current packs/day: 0.00   Average packs/day: 1.5 packs/day for 38.0 years (57.0 ttl pk-yrs)   Types: Cigarettes   Start date: 04/20/1974   Quit date: 04/20/2012   Years since quitting: 11.2  Smokeless Tobacco Never    Goals Met:  Exercise tolerated well No report of concerns or symptoms today Strength training completed today  Goals Unmet:  Not Applicable  Comments: Pt was cleared for supervised exercise therapy by Dr. Lorine Bears  Due to patients hx of CV disease pt is being watched closely and monitored for any unusual signs/symptoms while exercising. Will continue  to watch closely, monitor, and report any unusual or unexpected pain through the program.  Pt exercised on the Treadmill  for 34 minutes and experienced 3/5 claudication pain that resolved with sitting rest breaks. Pt tolerated exercise w/o usual and unexpected symptoms. Pt walking over 10 minutes at current ExRX, will increase incline by 1% next session. Will continue to monitor and progress through the program following PAD protocol.    Initial onset of claudication at: 7:52 Longest exercised time before break:12:24 Total exercise time/rest time: 34/6  Service time is from 0700 to 0812.    Dr. Armanda Magic is Medical Director for Cardiac Rehab at [facility].

## 2023-08-07 ENCOUNTER — Encounter (HOSPITAL_COMMUNITY)
Admission: RE | Admit: 2023-08-07 | Discharge: 2023-08-07 | Disposition: A | Discharge status: Home / Self Care | Payer: Medicare Other | Source: Ambulatory Visit | Attending: Cardiovascular Disease

## 2023-08-07 DIAGNOSIS — Z5189 Encounter for other specified aftercare: Secondary | ICD-10-CM | POA: Diagnosis not present

## 2023-08-07 DIAGNOSIS — I70211 Atherosclerosis of native arteries of extremities with intermittent claudication, right leg: Secondary | ICD-10-CM | POA: Diagnosis not present

## 2023-08-07 NOTE — Progress Notes (Signed)
Daily Session Note  Patient Details  Name: [name] MRN: [id number] Date of Birth: April 26, 1956 Referring Provider:   Doristine Devoid Supervised Exercise Therapy from 07/23/2023 in [facility] for Heart, Vascular, & Lung Health  Referring Provider Dr. Lorine Bears       Encounter Date: 08/07/2023  Check In:  Session Check In - 08/07/23 0818       Check-In   Supervising physician immediately available to respond to emergencies [facility] immediately available    Physician(s) Robin Searing, NP    Location [facility]    Staff Present Kary Kos, MS, Exercise Physiologist;Jetta Wilfred Lacy, ACSM-CEP, Exercise Physiologist;Kaylee Earlene Plater, MS, ACSM-CEP, Exercise Physiologist;Olinty Peggye Pitt, MS, ACSM-CEP, Exercise Physiologist;Carlette Les Pou, RN, BSN    Virtual Visit No    Medication changes reported     No    Comments --    Fall or balance concerns reported    No    Tobacco Cessation No Change    Current number of cigarettes/nicotine per day     0    Warm-up and Cool-down Not performed (comment)   Pt arrived late   Resistance Training Performed No    VAD Patient? No    PAD/SET Patient? Yes      PAD/SET Patient   Completed foot check today? Yes    Open wounds to report? No      Pain Assessment   Currently in Pain? No/denies    Multiple Pain Sites No             Capillary Blood Glucose: No results found for this or any previous visit (from the past 24 hours).   Exercise Prescription Changes - 08/07/23 0800       Response to Exercise   Blood Pressure (Admit) 100/60    Blood Pressure (Exercise) 130/68    Blood Pressure (Exit) 122/72    Heart Rate (Admit) 73 bpm    Heart Rate (Exercise) 102 bpm    Heart Rate (Exit) 74 bpm    Oxygen Saturation (Admit) 98 %    Oxygen Saturation (Exercise) 99 %    Oxygen Saturation (Exit) 97 %    Rating of Perceived Exertion (Exercise) 13    Symptoms 3/5 claudication    Comments INC incline  by 1%    Duration Progress to 10 minutes continuous walking  at current work load and total walking time to 30-45 min    Intensity THRR unchanged      Progression   Progression Continue to follow PAD protocol    Average METs 3.6      Resistance Training   Training Prescription No   No weights on Wed     Treadmill   MPH 2    Grade 4    Minutes 25    METs 3.6             Social History   Tobacco Use  Smoking Status Former   Current packs/day: 0.00   Average packs/day: 1.5 packs/day for 38.0 years (57.0 ttl pk-yrs)   Types: Cigarettes   Start date: 04/20/1974   Quit date: 04/20/2012   Years since quitting: 11.3  Smokeless Tobacco Never    Goals Met:  Independence with exercise equipment Exercise tolerated well No report of concerns or symptoms today  Goals Unmet:  Not Applicable  Comments: Pt was cleared for supervised exercise therapy by Dr. Lorine Bears  Due to patients hx of CV disease pt is being watched  closely and monitored for any unusual signs/symptoms while exercising. Will continue to watch closely, monitor, and report any unusual or unexpected pain through the program.  Pt exercised on the Treadmill  for 25 minutes and experienced 3/5 claudication pain that resolved with sitting rest breaks. Pt tolerated exercise w/o usual and unexpected symptoms. Will continue to monitor and progress through the program following PAD protocol.    Initial onset of claudication at: 3:30 Longest exercised time before break: 11:39 Total exercise time/rest time: 25/9  Service time is from 0711 to 0810.    Dr. Armanda Magic is Medical Director for Cardiac Rehab at [facility].

## 2023-08-09 ENCOUNTER — Encounter (HOSPITAL_COMMUNITY)
Admission: RE | Admit: 2023-08-09 | Discharge: 2023-08-09 | Disposition: A | Discharge status: Home / Self Care | Payer: Medicare Other | Source: Ambulatory Visit | Attending: Cardiovascular Disease | Admitting: Cardiovascular Disease

## 2023-08-09 DIAGNOSIS — I70211 Atherosclerosis of native arteries of extremities with intermittent claudication, right leg: Secondary | ICD-10-CM | POA: Diagnosis not present

## 2023-08-09 DIAGNOSIS — Z5189 Encounter for other specified aftercare: Secondary | ICD-10-CM | POA: Diagnosis not present

## 2023-08-09 NOTE — Progress Notes (Signed)
Daily Session Note  Patient Details  Name: [name] MRN: [id number] Date of Birth: [birth year] Referring Provider:   Doristine Devoid Supervised Exercise Therapy from 07/23/2023 in [facility] for Heart, Vascular, & Lung Health  Referring Provider Dr. Lorine Bears       Encounter Date: 08/09/2023  Check In:  Session Check In - 08/09/23 0705       Check-In   Supervising physician immediately available to respond to emergencies [facility] immediately available    Physician(s) Robin Searing, NP    Location [facility]    Staff Present Kary Kos, MS, Exercise Physiologist;Jetta Wilfred Lacy, ACSM-CEP, Exercise Physiologist;Kaylee Earlene Plater, MS, ACSM-CEP, Exercise Physiologist;Carlette Les Pou, RN, Cathlean Cower, MS, Exercise Physiologist    Virtual Visit No    Medication changes reported     No    Fall or balance concerns reported    No    Tobacco Cessation No Change    Current number of cigarettes/nicotine per day     0    Warm-up and Cool-down Performed as group-led instruction    Resistance Training Performed No    VAD Patient? No    PAD/SET Patient? Yes      PAD/SET Patient   Completed foot check today? Yes    Open wounds to report? No      Pain Assessment   Currently in Pain? No/denies    Multiple Pain Sites No             Capillary Blood Glucose: No results found for this or any previous visit (from the past 24 hours).   Exercise Prescription Changes - 08/09/23 0800       Response to Exercise   Blood Pressure (Admit) 122/70    Blood Pressure (Exercise) 110/70    Blood Pressure (Exit) 118/70    Heart Rate (Admit) 80 bpm    Heart Rate (Exercise) 101 bpm    Heart Rate (Exit) 80 bpm    Oxygen Saturation (Admit) 98 %    Oxygen Saturation (Exercise) 98 %    Oxygen Saturation (Exit) 99 %    Rating of Perceived Exertion (Exercise) 11    Symptoms 3/5 claudication    Duration Continue with 30 min of aerobic exercise  without signs/symptoms of physical distress.    Intensity THRR unchanged      Progression   Progression Continue to follow PAD protocol    Average METs 3.6      Resistance Training   Training Prescription Yes    Weight 4lbs    Reps 10-15      Treadmill   MPH 2    Grade 4    Minutes 32    METs 3.6             Social History   Tobacco Use  Smoking Status Former   Current packs/day: 0.00   Average packs/day: 1.5 packs/day for 38.0 years (57.0 ttl pk-yrs)   Types: Cigarettes   Start date: 04/20/1974   Quit date: 04/20/2012   Years since quitting: 11.3  Smokeless Tobacco Never    Goals Met:  Independence with exercise equipment Exercise tolerated well No report of concerns or symptoms today Strength training completed today  Goals Unmet:  Not Applicable  Comments: Pt was cleared for supervised exercise therapy by Dr. Lorine Bears  Due to patients hx of CV disease pt is being watched closely and monitored for any unusual signs/symptoms while exercising. Will continue to watch closely,  monitor, and report any unusual or unexpected pain through the program.  Pt exercised on the Treadmill  for 32 minutes and experienced 3/5 claudication pain that resolved with sitting rest breaks. Pt tolerated exercise w/o usual and unexpected symptoms. Will continue to monitor and progress through the program following PAD protocol.   Will increase incline by 1% on Monday.   Initial onset of claudication at: 4:00 Longest exercised time before break: 11:06 Total exercise time/rest time: 32/8  Service time is from 0700 to 0810.    Dr. Armanda Magic is Medical Director for Cardiac Rehab at [facility].

## 2023-08-12 ENCOUNTER — Encounter (HOSPITAL_COMMUNITY)
Admission: RE | Admit: 2023-08-12 | Discharge: 2023-08-12 | Disposition: A | Discharge status: Home / Self Care | Payer: Medicare Other | Source: Ambulatory Visit | Attending: Cardiovascular Disease | Admitting: Cardiovascular Disease

## 2023-08-12 DIAGNOSIS — I70211 Atherosclerosis of native arteries of extremities with intermittent claudication, right leg: Secondary | ICD-10-CM

## 2023-08-12 DIAGNOSIS — Z5189 Encounter for other specified aftercare: Secondary | ICD-10-CM | POA: Diagnosis not present

## 2023-08-12 NOTE — Progress Notes (Signed)
Daily Session Note  Patient Details  Name: [name] MRN: [id number] Date of Birth: 01-Aug-1955 Referring Provider:   Doristine Devoid Supervised Exercise Therapy from 07/23/2023 in [facility]  Referring Provider Dr. Lorine Bears       Encounter Date: 08/12/2023  Check In:  Session Check In - 08/12/23 0709       Check-In   Supervising physician immediately available to respond to emergencies [facility] MD immediately available    Physician(s) Tereso Newcomer, PA    Location [facility]    Staff Present Kary Kos, MS, Exercise Physiologist;Jetta Dan Humphreys BS, ACSM-CEP, Exercise Physiologist;Kaylee Earlene Plater, MS, ACSM-CEP, Exercise Physiologist;Bailey Wallace Cullens, MS, Exercise Physiologist;Annedrea Remus Loffler, RN, MHA    Virtual Visit No    Medication changes reported     No    Fall or balance concerns reported    No    Tobacco Cessation No Change    Current number of cigarettes/nicotine per day     0    Warm-up and Cool-down Performed on first and last piece of equipment   Warm up completed on TM; Cool-down with group.   Resistance Training Performed Yes    VAD Patient? No    PAD/SET Patient? Yes      PAD/SET Patient   Completed foot check today? Yes    Open wounds to report? Yes      Pain Assessment   Currently in Pain? No/denies    Multiple Pain Sites No             Capillary Blood Glucose: No results found for this or any previous visit (from the past 24 hours).   Exercise Prescription Changes - 08/12/23 0800       Response to Exercise   Blood Pressure (Admit) 118/64    Blood Pressure (Exercise) 126/66    Blood Pressure (Exit) 106/66    Heart Rate (Admit) 83 bpm    Heart Rate (Exercise) 106 bpm    Heart Rate (Exit) 78 bpm    Oxygen Saturation (Admit) 100 %    Oxygen Saturation (Exercise) 98 %    Oxygen Saturation (Exit) 99 %    Rating of Perceived Exertion (Exercise) 15    Symptoms 3/5  claudication    Comments INC incline by 1%    Duration Progress to 10 minutes continuous walking  at current work load and total walking time to 30-45 min    Intensity THRR unchanged      Progression   Progression Continue to follow PAD protocol    Average METs 3.6      Resistance Training   Training Prescription Yes    Weight 4lbs    Reps 10-15      Treadmill   MPH 2    Grade 5    Minutes 29    METs 3.9             Social History   Tobacco Use  Smoking Status Former   Current packs/day: 0.00   Average packs/day: 1.5 packs/day for 38.0 years (57.0 ttl pk-yrs)   Types: Cigarettes   Start date: 04/20/1974   Quit date: 04/20/2012   Years since quitting: 11.3  Smokeless Tobacco Never    Goals Met:  Independence with exercise equipment Exercise tolerated well No report of concerns or symptoms today Strength training completed today  Goals Unmet:  Not Applicable  Comments: Pt was cleared for supervised exercise therapy by Dr. Jerolyn Center  Arida  Due to patients hx of CV disease pt is being watched closely and monitored for any unusual signs/symptoms while exercising. Will continue to watch closely, monitor, and report any unusual or unexpected pain through the program.  Pt exercised on the Treadmill  for 29 minutes and experienced 3/5 claudication pain that resolved with sitting rest breaks. Pt tolerated exercise w/o usual and unexpected symptoms. Will continue to monitor and progress through the program following PAD protocol.   Incline increased by 1% today. Pt tolerated exercise well. Will allow more time for her to adjust to current ExRx before increasing incline again. RPE reported at 15   Initial onset of claudication at: 3:15 Longest exercised time before break: 12:30 Total exercise time/rest time: 29/5  Service time is from 0707 to 0805.    Dr. Armanda Magic is Medical Director for Cardiac Rehab at [facility].

## 2023-08-14 ENCOUNTER — Encounter (HOSPITAL_COMMUNITY)
Admission: RE | Admit: 2023-08-14 | Discharge: 2023-08-14 | Disposition: A | Discharge status: Home / Self Care | Payer: Medicare Other | Source: Ambulatory Visit | Attending: Cardiovascular Disease | Admitting: Cardiovascular Disease

## 2023-08-14 DIAGNOSIS — I70211 Atherosclerosis of native arteries of extremities with intermittent claudication, right leg: Secondary | ICD-10-CM

## 2023-08-14 DIAGNOSIS — Z5189 Encounter for other specified aftercare: Secondary | ICD-10-CM | POA: Diagnosis not present

## 2023-08-14 NOTE — Progress Notes (Signed)
Daily Session Note  Patient Details  Name: [name] MRN: [id number] Date of Birth: 1956-01-27 Referring Provider:   Doristine Devoid Supervised Exercise Therapy from 07/23/2023 in [facility]  Referring Provider Dr. Lorine Bears       Encounter Date: 08/14/2023  Check In:  Session Check In - 08/14/23 0703       Check-In   Supervising physician immediately available to respond to emergencies [facility] MD immediately available    Physician(s) Tereso Newcomer, PA    Location [facility]    Staff Present Kary Kos, MS, Exercise Physiologist;Jetta Dan Humphreys BS, ACSM-CEP, Exercise Physiologist;Kaylee Earlene Plater, MS, ACSM-CEP, Exercise Physiologist;Bailey Wallace Cullens, MS, Exercise Physiologist;Annedrea Remus Loffler, RN, MHA    Virtual Visit No    Medication changes reported     No    Fall or balance concerns reported    No    Tobacco Cessation No Change    Current number of cigarettes/nicotine per day     0    Warm-up and Cool-down Performed as group-led instruction    Resistance Training Performed No    VAD Patient? No    PAD/SET Patient? Yes      PAD/SET Patient   Completed foot check today? Yes    Open wounds to report? No      Pain Assessment   Currently in Pain? No/denies    Multiple Pain Sites No             Capillary Blood Glucose: No results found for this or any previous visit (from the past 24 hours).   Exercise Prescription Changes - 08/14/23 0700       Response to Exercise   Blood Pressure (Admit) 110/64    Blood Pressure (Exercise) --   D/C blood pressures while exs   Blood Pressure (Exit) 100/60    Heart Rate (Admit) 81 bpm    Heart Rate (Exercise) 101 bpm    Heart Rate (Exit) 78 bpm    Oxygen Saturation (Admit) 98 %    Oxygen Saturation (Exercise) 98 %    Oxygen Saturation (Exit) 99 %    Rating of Perceived Exertion (Exercise) 13    Symptoms 3/5 claudication    Duration Progress to 30  minutes of  aerobic without signs/symptoms of physical distress    Intensity THRR unchanged      Progression   Progression Continue to follow PAD protocol    Average METs 3.9      Resistance Training   Training Prescription No      Treadmill   MPH 2    Grade 5    Minutes 24    METs 3.9             Social History   Tobacco Use  Smoking Status Former   Current packs/day: 0.00   Average packs/day: 1.5 packs/day for 38.0 years (57.0 ttl pk-yrs)   Types: Cigarettes   Start date: 04/20/1974   Quit date: 04/20/2012   Years since quitting: 11.3  Smokeless Tobacco Never    Goals Met:  Independence with exercise equipment Exercise tolerated well No report of concerns or symptoms today  Goals Unmet:  Not Applicable  Comments: Pt was cleared for supervised exercise therapy by Dr. Lorine Bears  Due to patients hx of CV disease pt is being watched closely and monitored for any unusual signs/symptoms while exercising. Will continue to watch closely, monitor, and report any unusual or unexpected pain  through the program.  Pt exercised on the Treadmill  for 24 minutes and experienced 3/5 claudication pain that resolved with sitting rest breaks. Pt tolerated exercise w/o usual and unexpected symptoms. Will continue to monitor and progress through the program following PAD protocol.    Initial onset of claudication at: 6:08 Longest exercised time before break: 10:22 Total exercise time/rest time: 24/5  Service time is from 0700 to 0802.    Dr. Armanda Magic is Medical Director for Cardiac Rehab at [facility].

## 2023-08-16 ENCOUNTER — Encounter (HOSPITAL_COMMUNITY)
Admission: RE | Admit: 2023-08-16 | Discharge: 2023-08-16 | Disposition: A | Discharge status: Home / Self Care | Payer: Medicare Other | Source: Ambulatory Visit | Attending: Cardiovascular Disease | Admitting: Cardiovascular Disease

## 2023-08-16 DIAGNOSIS — Z5189 Encounter for other specified aftercare: Secondary | ICD-10-CM | POA: Diagnosis not present

## 2023-08-16 DIAGNOSIS — I70211 Atherosclerosis of native arteries of extremities with intermittent claudication, right leg: Secondary | ICD-10-CM

## 2023-08-16 NOTE — Progress Notes (Signed)
Daily Session Note  Patient Details  Name: [name] MRN: [id number] Date of Birth: 02/10/1956 Referring Provider:   Doristine Devoid Supervised Exercise Therapy from 07/23/2023 in [facility]  Referring Provider Dr. Lorine Bears       Encounter Date: 08/16/2023  Check In:  Session Check In - 08/16/23 1191       Check-In   Supervising physician immediately available to respond to emergencies [facility] MD immediately available    Physician(s) Edd Fabian, NP    Location [facility]    Staff Present Kary Kos, MS, Exercise Physiologist;Jetta Dan Humphreys BS, ACSM-CEP, Exercise Physiologist;Kaylee Earlene Plater, MS, ACSM-CEP, Exercise Physiologist;Bailey Wallace Cullens, MS, Exercise Physiologist;Annedrea Remus Loffler, RN, MHA    Virtual Visit No    Medication changes reported     No    Fall or balance concerns reported    No    Tobacco Cessation No Change    Current number of cigarettes/nicotine per day     0    Warm-up and Cool-down Not performed (comment)   warm-up performed on TM; Cool-down as group   Resistance Training Performed Yes    VAD Patient? No    PAD/SET Patient? Yes      PAD/SET Patient   Completed foot check today? Yes    Open wounds to report? No      Pain Assessment   Currently in Pain? No/denies    Multiple Pain Sites No             Capillary Blood Glucose: No results found for this or any previous visit (from the past 24 hours).   Exercise Prescription Changes - 08/16/23 0800       Response to Exercise   Blood Pressure (Admit) 100/64    Blood Pressure (Exit) 112/72    Heart Rate (Admit) 76 bpm    Heart Rate (Exercise) 108 bpm    Heart Rate (Exit) 82 bpm    Oxygen Saturation (Admit) 96 %    Oxygen Saturation (Exercise) 93 %    Oxygen Saturation (Exit) 99 %    Rating of Perceived Exertion (Exercise) 13    Symptoms 3/5 claudication    Comments Incline increased by 1%    Duration  Progress to 30 minutes of  aerobic without signs/symptoms of physical distress    Intensity THRR unchanged      Progression   Progression Continue to follow PAD protocol    Average METs 4.2      Resistance Training   Training Prescription Yes    Weight 4lbs    Reps 10-15      Treadmill   MPH 2    Grade 6    Minutes 27    METs 4.2             Social History   Tobacco Use  Smoking Status Former   Current packs/day: 0.00   Average packs/day: 1.5 packs/day for 38.0 years (57.0 ttl pk-yrs)   Types: Cigarettes   Start date: 04/20/1974   Quit date: 04/20/2012   Years since quitting: 11.3  Smokeless Tobacco Never    Goals Met:  Independence with exercise equipment Exercise tolerated well No report of concerns or symptoms today Strength training completed today  Goals Unmet:  Not Applicable  Comments: Pt was cleared for supervised exercise therapy by Dr. Lorine Bears  Due to patients hx of CV disease pt is being watched closely and monitored for any unusual  signs/symptoms while exercising. Will continue to watch closely, monitor, and report any unusual or unexpected pain through the program.  Pt exercised on the Treadmill  for 27 minutes and experienced 3/5 claudication pain that resolved with sitting rest breaks. Pt tolerated exercise w/o usual and unexpected symptoms. Will continue to monitor and progress through the program following PAD protocol.   Incline increased by 1%, pt given worksheet to track her exercise at home and instructions to walk over the weekend.   Initial onset of claudication at: 0:50 Longest exercised time before break: 12:16 Total exercise time/rest time: 27/8  Service time is from 0700 to 0802.    Dr. Armanda Magic is Medical Director for Cardiac Rehab at [facility].

## 2023-08-16 NOTE — Progress Notes (Signed)
PAD/SET Plan of Care Updates  Patient Details  Name: [name] MRN: [id number] Date of Birth: 03-20-1956  Entry Date:  Flowsheet Row Supervised Exercise Therapy from 07/23/2023 in [facility]  Date 07/23/23      Expected Discharge Date:  Flowsheet Row Supervised Exercise Therapy from 07/23/2023 in [facility]  Expected Discharge Date 10/14/23       Medical Diagnosis: Atherosclerosis of native artery of right lower extremity with intermittent claudication [facility])  Encounter Date: 08/16/23  Exercise Target Goals: Exercise Program Goal: Individual exercise prescription set with THRR, safety & activity barriers. Participant demonstrates ability to understand and report RPE using RPE 6-20 scale, to self-measure pulse accurately, and to acknowledge the importance of the exercise prescription.  Exercise Prescription Goal: Use initial exercise assessment to set exercise prescription to improve time and distance to onset of claudication pain. Provide education to aid in steps toward risk factor modification, improve quality of life with claudication, and utilize THRR and exercise prescription for best results for decreasing symptoms of PAD. Prevent injury to bone, joints, and skin integrity during the exercise through checks of each system during session check in. Following physician guidelines for any contraindications to specific exercises.  Activity Barriers:  Activity Barriers & Cardiac Risk Stratification - 07/23/23 0916       Activity Barriers & Cardiac Risk Stratification   Cardiac Risk Stratification High             Initial Exercise Prescription:  Initial Exercise Prescription - 07/23/23 0900       Date of Initial Exercise RX and Referring Provider   Date 07/23/23    Referring Provider Dr. Lorine Bears    Expected Discharge Date 10/14/23       Treadmill   MPH 2    Grade 2    Minutes 30    METs 3.1      Prescription Details   Frequency (times per week) 3 days/week    Duration Progress to 30 minutes of continuous aerobic without signs/symptoms of physical distress      Intensity   THRR 40-80% of Max Heartrate 62-124    Ratings of Perceived Exertion 11-13    Perceived Dyspnea 0-4      Progression   Progression Continue to follow PAD protocol      Resistance Training   Training Prescription Yes    Weight 3lbs    Reps 10-15             Perform Capillary Blood Glucose checks as needed.  Current Exercise Prescription:   Exercise Prescription Changes     Row Name 07/24/23 1300 07/26/23 0900 07/29/23 0800 07/31/23 0800 08/02/23 0800     Response to Exercise   Blood Pressure (Admit) 112/59 138/80 118/64 106/64 128/68   Blood Pressure (Exercise) 142/66 130/66 118/66 134/66 128/70   Blood Pressure (Exit) 116/62 118/60 114/64 110/64 114/72   Heart Rate (Admit) 64 bpm 82 bpm 92 bpm 73 bpm 80 bpm   Heart Rate (Exercise) 99 bpm 98 bpm 108 bpm 103 bpm 100 bpm   Heart Rate (Exit) 71 bpm 65 bpm 87 bpm 76 bpm 71 bpm   Oxygen Saturation (Admit) -- 98 % 99 % 96 % 98 %   Oxygen Saturation (Exercise) -- 97 % 99 % 99 % 95 %   Oxygen Saturation (Exit) -- 97 % 98 % 99 % 99 %  Rating of Perceived Exertion (Exercise) -- 11 11 11 13    Symptoms 3/5 claudication 3/5 claudication 3/5 claudication 3/5 claudication 3/5 claudication   Comments Pt first day -- -- -- Incline increase 1%   Duration Progress to 30 minutes of  aerobic without signs/symptoms of physical distress Progress to 30 minutes of  aerobic without signs/symptoms of physical distress Progress to 30 minutes of  aerobic without signs/symptoms of physical distress Progress to 10 minutes continuous walking  at current work load and total walking time to 30-45 min Progress to 10 minutes continuous walking  at current work load and total walking time to 30-45 min   Intensity  THRR unchanged THRR unchanged THRR unchanged THRR unchanged THRR unchanged     Progression   Progression Continue to follow PAD protocol Continue to follow PAD protocol Continue to follow PAD protocol Continue to follow PAD protocol Continue to follow PAD protocol   Average METs 3.08 3.08 3.08 3.08 3.4     Resistance Training   Training Prescription No  No weights on Wed Yes Yes --  No weights on Wed No  Pt did weights on Thursday at home   Weight -- 3lbs 3lbs -- --   Reps -- 10-15 10-15 -- --     Treadmill   MPH 2 2 2 2 2    Grade 2 2 2 2 3    Minutes 29 22 24  39 30   METs 3.08 3.08 3.08 3.08 3.4    Row Name 08/05/23 0800 08/07/23 0800 08/09/23 0800 08/12/23 0800 08/14/23 0700     Response to Exercise   Blood Pressure (Admit) 100/62 100/60 122/70 118/64 110/64   Blood Pressure (Exercise) 126/68 130/68 110/70 126/66 --  D/C blood pressures while exs   Blood Pressure (Exit) 122/62 122/72 118/70 106/66 100/60   Heart Rate (Admit) 69 bpm 73 bpm 80 bpm 83 bpm 81 bpm   Heart Rate (Exercise) 101 bpm 102 bpm 101 bpm 106 bpm 101 bpm   Heart Rate (Exit) 70 bpm 74 bpm 80 bpm 78 bpm 78 bpm   Oxygen Saturation (Admit) 98 % 98 % 98 % 100 % 98 %   Oxygen Saturation (Exercise) 97 % 99 % 98 % 98 % 98 %   Oxygen Saturation (Exit) 97 % 97 % 99 % 99 % 99 %   Rating of Perceived Exertion (Exercise) 13 13 11 15 13    Symptoms 3/5 claudication 3/5 claudication 3/5 claudication 3/5 claudication 3/5 claudication   Comments Pt walking over 10 minutes w/o rest. Will increase incline next session. Increase wts to 4lbs. INC incline by 1% -- INC incline by 1% --   Duration Continue with 30 min of aerobic exercise without signs/symptoms of physical distress. Progress to 10 minutes continuous walking  at current work load and total walking time to 30-45 min Continue with 30 min of aerobic exercise without signs/symptoms of physical distress. Progress to 10 minutes continuous walking  at current work load and total  walking time to 30-45 min Progress to 30 minutes of  aerobic without signs/symptoms of physical distress   Intensity THRR unchanged THRR unchanged THRR unchanged THRR unchanged THRR unchanged     Progression   Progression Continue to follow PAD protocol Continue to follow PAD protocol Continue to follow PAD protocol Continue to follow PAD protocol Continue to follow PAD protocol   Average METs 3.4 3.6 3.6 3.6 3.9     Resistance Training   Training Prescription Yes No  No weights  on Wed Yes Yes No   Weight 4lbs -- 4lbs 4lbs --   Reps 10-15 -- 10-15 10-15 --     Treadmill   MPH 2 2 2 2 2    Grade 3 4 4 5 5    Minutes 34 25 32 29 24   METs 3.4 3.6 3.6 3.9 3.9    Row Name 08/16/23 0800             Response to Exercise   Blood Pressure (Admit) 100/64       Blood Pressure (Exit) 112/72       Heart Rate (Admit) 76 bpm       Heart Rate (Exercise) 108 bpm       Heart Rate (Exit) 82 bpm       Oxygen Saturation (Admit) 96 %       Oxygen Saturation (Exercise) 93 %       Oxygen Saturation (Exit) 99 %       Rating of Perceived Exertion (Exercise) 13       Symptoms 3/5 claudication       Comments Incline increased by 1%       Duration Progress to 30 minutes of  aerobic without signs/symptoms of physical distress       Intensity THRR unchanged         Progression   Progression Continue to follow PAD protocol       Average METs 4.2         Resistance Training   Training Prescription Yes       Weight 4lbs       Reps 10-15         Treadmill   MPH 2       Grade 6       Minutes 27       METs 4.2                Exercise Comments:   Exercise Comments     Row Name 07/23/23 0918 07/24/23 1215 07/26/23 1036 07/29/23 0818 07/31/23 0830   Exercise Comments Pt orientation, pt exercised w/o unusual s/s. Pt exercised on the treadmill at a speed of 2.0 and incline of 2%. She reported having 3/5 claudication while walking that resolved with sitting rest breaks. No unusual or unexpected  symptoms reported.      Initial onset of claudication: 3 minutes 50 seconds  Longest walk time w/o break: 10 min 25 seconds   Longest break time: 3 minutes   Total Exercise Time/Rest Time: 29 mins 30 seconds/14 minutes Pt exercised on the treadmill at 2.0/2.0%, she reported 3/5 claudication while walking that resolved with sitting rest breaks. No unusual or unexpected symptoms reported.     Initial onset of claudication at 4.5 minutes.   Longest exercised time before break:8 min 12s.   Longest rest: 2 min 6s.   Total exercise time/rest time: 22 minutes/5 minutes Pt exercised on the treadmill at 2.0/2.0% for 24 minutes. She reported 3/5 claudication while walking that resolved with sitting rest breaks. No unusual or unexpected symptoms reported.     Initial onset of claudication at: 7 minutes 51 seconds  Longest exercised time before break:8 min 27s  Longest rest: 2 min  Total exercise time/rest time:  24 min/9 min Pt exercised on the treadmill at 2.0/2.0%, she reported 3/5 claudication while walking that resolved with sitting rest breaks. No unusual or unexpected symptoms reported.     Initial onset of claudication at: 16s  Longest exercised time before break: 06s  Longest rest: 2 min 19  Total exercise time/rest time:  50min/13min    Row Name 08/02/23 0847 08/05/23 0903 08/07/23 0825 08/09/23 0833 08/12/23 0904   Exercise Comments Pt exercised on the Treadmill  for 30 minutes and experienced 3/5 claudication pain that resolved with sitting rest breaks. Today I increased the incline by 1%, pt tolerated exercise w/o usual and unexpected symptoms. Will continue to monitor and progress through the program following PAD protocol.      Initial onset of claudication at: 30s  Longest exercised time before break: 23s  Longest rest: 3 min 52s  Total exercise time/rest time: 90min/11min Pt exercised on the Treadmill  for 34 minutes and experienced 3/5 claudication pain that resolved with sitting rest  breaks. Pt tolerated exercise w/o usual and unexpected symptoms. Pt walking over 10 minutes at current ExRX, will increase incline by 1% next session. Will continue to monitor and progress through the program following PAD protocol. Pt exercised on the Treadmill  for 25 minutes and experienced 3/5 claudication pain that resolved with sitting rest breaks. Pt tolerated exercise w/o usual and unexpected symptoms. Will continue to monitor and progress through the program following PAD protocol.      Initial onset of claudication at: 3:30  Longest exercised time before break: 11:39  Total exercise time/rest time: 25/9     Service time is from 0711 to 0810. Pt exercised on the Treadmill  for 32 minutes and experienced 3/5 claudication pain that resolved with sitting rest breaks. Pt tolerated exercise w/o usual and unexpected symptoms. Will continue to monitor and progress through the program following PAD protocol.      Will increase incline by 1% on Monday.     Initial onset of claudication at: 4:00  Longest exercised time before break: 11:06  Total exercise time/rest time: 32/8 Pt exercised on the Treadmill  for 29 minutes and experienced 3/5 claudication pain that resolved with sitting rest breaks. Pt tolerated exercise w/o usual and unexpected symptoms. Will continue to monitor and progress through the program following PAD protocol.      Incline increased by 1% today. Pt tolerated exercise well. Will allow more time for her to adjust to current ExRx before increasing incline again. RPE reported at 70    Row Name 08/16/23 0849           Exercise Comments Pt exercised on the Treadmill  for 27 minutes and experienced 3/5 claudication pain that resolved with sitting rest breaks. Pt tolerated exercise w/o usual and unexpected symptoms. Will continue to monitor and progress through the program following PAD protocol.      Incline increased by 1%, pt given worksheet to track her exercise at home and instructions to  walk over the weekend.      Initial onset of claudication at: 0:50  Longest exercised time before break: 12:16  Total exercise time/rest time: 27/8                Program and Patient Goals Exercise Goals and Review:   Exercise Goals     Row Name 07/23/23 0917             Exercise Goals   Increase Physical Activity Yes       Intervention Provide advice, education, support and counseling about physical activity/exercise needs.;Develop an individualized exercise prescription for aerobic and resistive training based on initial evaluation findings, risk stratification, comorbidities and participant's personal goals.  Expected Outcomes Short Term: Attend rehab on a regular basis to increase amount of physical activity.;Long Term: Add in home exercise to make exercise part of routine and to increase amount of physical activity.;Long Term: Exercising regularly at least 3-5 days a week.       Increase Strength and Stamina Yes       Intervention Provide advice, education, support and counseling about physical activity/exercise needs.;Develop an individualized exercise prescription for aerobic and resistive training based on initial evaluation findings, risk stratification, comorbidities and participant's personal goals.       Expected Outcomes Short Term: Increase workloads from initial exercise prescription for resistance, speed, and METs.;Short Term: Perform resistance training exercises routinely during rehab and add in resistance training at home;Long Term: Improve cardiorespiratory fitness, muscular endurance and strength as measured by increased METs and functional capacity ( )       Able to understand and use rate of perceived exertion (RPE) scale Yes       Intervention Provide education and explanation on how to use RPE scale       Expected Outcomes Short Term: Able to use RPE daily in rehab to express subjective intensity level;Long Term:  Able to use RPE to guide intensity level  when exercising independently       Knowledge and understanding of Target Heart Rate Range (THRR) Yes       Intervention Provide education and explanation of THRR including how the numbers were predicted and where they are located for reference       Expected Outcomes Short Term: Able to state/look up THRR;Long Term: Able to use THRR to govern intensity when exercising independently;Short Term: Able to use daily as guideline for intensity in rehab       Understanding of Exercise Prescription Yes       Intervention Provide education, explanation, and written materials on patient's individual exercise prescription       Expected Outcomes Short Term: Able to explain program exercise prescription;Long Term: Able to explain home exercise prescription to exercise independently       Improve claudication pain toleration; Improve walking ability Yes       Intervention Participate in PAD/SET Rehab 2-3 days a week, walking at home as part of exercise prescription;Attend education sessions to aid in risk factor modification and understanding of disease process       Expected Outcomes Short Term: Improve walking distance/time to onset of claudication pain;Long Term: Improve walking ability and toleration to claudication;Long Term: Improve score of PAD questionnaires                 Biometrics:  Pre Biometrics - 07/24/23 1610       Pre Biometrics   Height 4\' 11"  (1.499 m)    Weight 56.2 kg    Waist Circumference 36 inches    Hip Circumference 38 inches    Waist to Hip Ratio 0.95 %    BMI (Calculated) 25    Triceps Skinfold 14 mm    % Body Fat 34.9 %    Grip Strength 22 kg    Flexibility 19 in    Single Leg Stand 30 seconds               ITP Comments:  ITP Comments     Row Name 07/23/23 0732           ITP Comments Dr Lorine Bears, PAD/SET Referring/Supervising Physician.  Comments: Ms Desch is  Physician Documentation: Your signature/cosign is  required to indicate approval of the Care Plan as stated above. By signing this report, you are approving the Plan of Care. Please sign and either send electronically or print and fax the signed copy to the number below. Please indicate any changes or limitations in the space provided.   ____________________________________________________________________________________________________________________________________________  Physician Signature: _____________________________________________________ Print Physician Name: ____________________________________________________ Date: ___________________________ Time: _________________________________  [facility]  [place]. [facility] [place]. [facility] [address] [place], [place] [address] Phone: [phone] Fax: [phone]

## 2023-08-19 ENCOUNTER — Encounter (HOSPITAL_COMMUNITY)
Admission: RE | Admit: 2023-08-19 | Discharge: 2023-08-19 | Disposition: A | Discharge status: Home / Self Care | Payer: Medicare Other | Source: Ambulatory Visit | Attending: Cardiovascular Disease | Admitting: Cardiovascular Disease

## 2023-08-19 DIAGNOSIS — Z5189 Encounter for other specified aftercare: Secondary | ICD-10-CM | POA: Insufficient documentation

## 2023-08-19 DIAGNOSIS — I70211 Atherosclerosis of native arteries of extremities with intermittent claudication, right leg: Secondary | ICD-10-CM | POA: Insufficient documentation

## 2023-08-19 DIAGNOSIS — Z87891 Personal history of nicotine dependence: Secondary | ICD-10-CM | POA: Insufficient documentation

## 2023-08-19 NOTE — Progress Notes (Signed)
Daily Session Note  Patient Details  Name: [name] MRN: [id number] Date of Birth: May 16, 1956 Referring Provider:   Doristine Devoid Supervised Exercise Therapy from 07/23/2023 in [facility] for Heart, Vascular, & Lung Health  Referring Provider Dr. Lorine Bears       Encounter Date: 08/19/2023  Check In:  Session Check In - 08/19/23 0704       Check-In   Supervising physician immediately available to respond to emergencies [facility] MD immediately available    Physician(s) Edd Fabian, NP    Location [facility]    Staff Present Kary Kos, MS, Exercise Physiologist;Jetta Dan Humphreys BS, ACSM-CEP, Exercise Physiologist;Kaylee Earlene Plater, MS, ACSM-CEP, Exercise Physiologist;Bailey Wallace Cullens, MS, Exercise Physiologist;Annedrea Remus Loffler, RN, MHA    Virtual Visit No    Medication changes reported     No    Fall or balance concerns reported    No    Tobacco Cessation No Change    Current number of cigarettes/nicotine per day     0    Warm-up and Cool-down Not performed (comment)   warm-up performed on TM; Cool-down as group   Resistance Training Performed Yes    VAD Patient? No    PAD/SET Patient? Yes      PAD/SET Patient   Completed foot check today? Yes    Open wounds to report? No      Pain Assessment   Currently in Pain? No/denies    Multiple Pain Sites No             Capillary Blood Glucose: No results found for this or any previous visit (from the past 24 hours).   Exercise Prescription Changes - 08/19/23 0800       Response to Exercise   Blood Pressure (Admit) 110/68    Blood Pressure (Exit) 100/70    Heart Rate (Admit) 81 bpm    Heart Rate (Exercise) 109 bpm    Heart Rate (Exit) 81 bpm    Oxygen Saturation (Admit) 99 %    Oxygen Saturation (Exercise) 98 %    Oxygen Saturation (Exit) 99 %    Rating of Perceived Exertion (Exercise) 13    Symptoms 2/5 claudication    Comments Incline increased by 1%; pt reports fatigue     Duration Progress to 30 minutes of  aerobic without signs/symptoms of physical distress    Intensity THRR unchanged      Progression   Progression Continue to follow PAD protocol    Average METs 4.5      Resistance Training   Training Prescription Yes    Weight 4lbs    Reps 10-15      Treadmill   MPH 2    Grade 7    Minutes 31    METs 4.5             Social History   Tobacco Use  Smoking Status Former   Current packs/day: 0.00   Average packs/day: 1.5 packs/day for 38.0 years (57.0 ttl pk-yrs)   Types: Cigarettes   Start date: 04/20/1974   Quit date: 04/20/2012   Years since quitting: 11.3  Smokeless Tobacco Never    Goals Met:  Independence with exercise equipment Exercise tolerated well No report of concerns or symptoms today Strength training completed today  Goals Unmet:  Not Applicable  Comments: Pt was cleared for supervised exercise therapy by Dr. Lorine Bears  Due to patients hx of CV disease pt is being watched closely and monitored  for any unusual signs/symptoms while exercising. Will continue to watch closely, monitor, and report any unusual or unexpected pain through the program.  Pt exercised on the Treadmill  for 31 minutes and experienced 3/5 claudication pain that resolved with sitting rest breaks. Pt tolerated exercise w/o usual and unexpected symptoms. Will continue to monitor and progress through the program following PAD protocol.    Initial onset of claudication at:5:30 Longest exercised time before break: 12:13 Total exercise time/rest time: 31/6  Service time is from 0700 to 0804.    Dr. Armanda Magic is Medical Director for Cardiac Rehab at [facility].

## 2023-08-21 ENCOUNTER — Encounter (HOSPITAL_COMMUNITY)
Admission: RE | Admit: 2023-08-21 | Discharge: 2023-08-21 | Disposition: A | Discharge status: Home / Self Care | Payer: Self-pay | Source: Ambulatory Visit | Attending: Cardiovascular Disease | Admitting: Cardiovascular Disease

## 2023-08-21 DIAGNOSIS — I70211 Atherosclerosis of native arteries of extremities with intermittent claudication, right leg: Secondary | ICD-10-CM

## 2023-08-21 DIAGNOSIS — Z5189 Encounter for other specified aftercare: Secondary | ICD-10-CM | POA: Diagnosis not present

## 2023-08-21 DIAGNOSIS — Z87891 Personal history of nicotine dependence: Secondary | ICD-10-CM | POA: Diagnosis not present

## 2023-08-21 NOTE — Progress Notes (Signed)
 Daily Session Note  Patient Details  Name: SANDEEP DELAGARZA MRN: [id number] Date of Birth: May 19, 1956 Referring Provider:   Conrad Ports Supervised Exercise Therapy from 07/23/2023 in [facility], Vascular, & Lung Health  Referring Provider Dr. Deatrice Cage       Encounter Date: 08/21/2023  Check In:  Session Check In - 08/21/23 0704       Check-In   Supervising physician immediately available to respond to emergencies [facility] MD immediately available    Physician(s) Rosabel Mose, NP    Location [facility]    Staff Present Alec Finder BS, ACSM-CEP, Exercise Physiologist;Kaylee Nicholaus, MS, ACSM-CEP, Exercise Physiologist;Caitlin Ainley Fayette, MS, Exercise Physiologist;Carlette Bernett, RN, BSN    Virtual Visit No    Medication changes reported     No    Fall or balance concerns reported    No    Tobacco Cessation No Change    Current number of cigarettes/nicotine per day     0    Warm-up and Cool-down Performed on first and last piece of equipment   peformed Warm-Up on TMl; [place] as Group   Resistance Training Performed No    VAD Patient? No    PAD/SET Patient? Yes      PAD/SET Patient   Completed foot check today? Yes    Open wounds to report? No      Pain Assessment   Currently in Pain? No/denies    Multiple Pain Sites No             Capillary Blood Glucose: No results found for this or any previous visit (from the past 24 hours).   Exercise Prescription Changes - 08/21/23 0800       Response to Exercise   Blood Pressure (Admit) 100/66    Blood Pressure (Exit) 100/62    Heart Rate (Admit) 84 bpm    Heart Rate (Exercise) 102 bpm    Heart Rate (Exit) 71 bpm    Rating of Perceived Exertion (Exercise) 9    Symptoms 3/5    Comments Pt c/p of calf soreness-reverted back to initial    Duration Progress to 45 minutes of aerobic exercise without signs/symptoms of physical distress    Intensity THRR unchanged       Progression   Progression Continue to follow PAD protocol    Average METs 3.08      Resistance Training   Training Prescription No    Weight --   no weights on Wed     Treadmill   MPH 2    Grade 2    Minutes 32    METs 3.08             Social History   Tobacco Use  Smoking Status Former   Current packs/day: 0.00   Average packs/day: 1.5 packs/day for 38.0 years (57.0 ttl pk-yrs)   Types: Cigarettes   Start date: 04/20/1974   Quit date: 04/20/2012   Years since quitting: 11.3  Smokeless Tobacco Never    Goals Met:  Independence with exercise equipment Exercise tolerated well  Goals Unmet:  Not Applicable  Comments: Pt was cleared for supervised exercise therapy by Dr. Deatrice Cage  Due to patients hx of CV disease pt is being watched closely and monitored for any unusual signs/symptoms while exercising. Will continue to watch closely, monitor, and report any unusual or unexpected pain through the program.  Pt exercised on the Treadmill  for 32 minutes and experienced  3/5 claudication pain that resolved with sitting rest breaks. Pt tolerated exercise w/o usual and unexpected symptoms. Will continue to monitor and progress through the program following PAD protocol.    Pt reported calf soreness after Monday's exercise session, explained to pt that soreness is to be expected and it'll resolve after her body adjust to exercise intensity. Reverted pt to initial exercise prescription to help facilitate her recovery and instructed her to drink plenty of water and continue walking today and tomorrow.   Initial onset of claudication at: 12:03 Longest exercised time before break: 20:37 Total exercise time/rest time: 32/2  Service time is from 0700 to 0802.    Dr. Wilbert Bihari is Medical Director for Cardiac Rehab at [facility].

## 2023-08-23 ENCOUNTER — Encounter (HOSPITAL_COMMUNITY)
Admission: RE | Admit: 2023-08-23 | Discharge: 2023-08-23 | Disposition: A | Discharge status: Home / Self Care | Payer: Self-pay | Source: Ambulatory Visit | Attending: Cardiovascular Disease | Admitting: Cardiovascular Disease

## 2023-08-23 DIAGNOSIS — Z87891 Personal history of nicotine dependence: Secondary | ICD-10-CM | POA: Diagnosis not present

## 2023-08-23 DIAGNOSIS — Z5189 Encounter for other specified aftercare: Secondary | ICD-10-CM | POA: Diagnosis not present

## 2023-08-23 DIAGNOSIS — I70211 Atherosclerosis of native arteries of extremities with intermittent claudication, right leg: Secondary | ICD-10-CM

## 2023-08-23 NOTE — Progress Notes (Signed)
 Daily Session Note  Patient Details  Name: [name] MRN: [id number] Date of Birth: [birth year] Referring Provider:   Conrad Ports Supervised Exercise Therapy from 07/23/2023 in [facility] for [facility], V[facility], & Lung Health  Referring Provider Dr. Deatrice Cage       Encounter Date: 08/23/2023  Check In:  Session Check In - 08/23/23 0810       Check-In   Supervising physician immediately available to respond to emergencies [facility] MD immediately available    Physician(s) Rosabel Mose, NP    Location [facility]    Staff Present Alec Finder BS, ACSM-CEP, Exercise Physiologist;Kaylee Nicholaus, MS, ACSM-CEP, Exercise Physiologist;Reagann Dolce Fayette, MS, Exercise Physiologist;Carlette Bernett, RN, BSN    Virtual Visit No    Medication changes reported     No    Fall or balance concerns reported    No    Tobacco Cessation No Change    Current number of cigarettes/nicotine per day     0    Warm-up and Cool-down Performed on first and last piece of equipment   peformed Warm-Up on TMl; [place] as Group   Resistance Training Performed Yes    VAD Patient? No    PAD/SET Patient? Yes      PAD/SET Patient   Completed foot check today? Yes    Open wounds to report? Yes      Pain Assessment   Currently in Pain? No/denies    Multiple Pain Sites No             Capillary Blood Glucose: No results found for this or any previous visit (from the past 24 hours).   Exercise Prescription Changes - 08/23/23 0800       Response to Exercise   Blood Pressure (Admit) 114/66    Blood Pressure (Exit) 96/64    Heart Rate (Admit) 76 bpm    Heart Rate (Exercise) 106 bpm    Heart Rate (Exit) 72 bpm    Rating of Perceived Exertion (Exercise) 15    Symptoms 3/5 claudication    Comments Incline increased by 1%    Duration Progress to 10 minutes continuous walking  at current work load and total walking time to 30-45 min    Intensity THRR unchanged       Progression   Progression Continue to follow PAD protocol    Average METs 4.7      Resistance Training   Training Prescription Yes    Weight 4lbs    Reps 10-15      Treadmill   MPH 2    Grade 8    Minutes 23    METs 4.7             Social History   Tobacco Use  Smoking Status Former   Current packs/day: 0.00   Average packs/day: 1.5 packs/day for 38.0 years (57.0 ttl pk-yrs)   Types: Cigarettes   Start date: 04/20/1974   Quit date: 04/20/2012   Years since quitting: 11.3  Smokeless Tobacco Never    Goals Met:  Independence with exercise equipment Exercise tolerated well No report of concerns or symptoms today Strength training completed today  Goals Unmet:  Not Applicable  Comments: Pt was cleared for supervised exercise therapy by Dr. Deatrice Cage  Due to patients hx of CV disease pt is being watched closely and monitored for any unusual signs/symptoms while exercising. Will continue to watch closely, monitor, and report any unusual or unexpected pain through  the program.  Pt exercised on the Treadmill  for 23 minutes and experienced 3/5 claudication pain that resolved with sitting rest breaks. Pt tolerated exercise w/o usual and unexpected symptoms. Will continue to monitor and progress through the program following PAD protocol.    Initial onset of claudication at: 4:45 Longest exercised time before break:10:23 Total exercise time/rest time: 23/8  Service time is from 0700 to 0806.    Dr. Wilbert Bihari is Medical Director for Cardiac Rehab at [facility].

## 2023-08-26 ENCOUNTER — Encounter (HOSPITAL_COMMUNITY)
Admission: RE | Admit: 2023-08-26 | Discharge: 2023-08-26 | Disposition: A | Discharge status: Home / Self Care | Payer: Self-pay | Source: Ambulatory Visit | Attending: Cardiovascular Disease | Admitting: Cardiovascular Disease

## 2023-08-26 DIAGNOSIS — I70211 Atherosclerosis of native arteries of extremities with intermittent claudication, right leg: Secondary | ICD-10-CM

## 2023-08-26 DIAGNOSIS — Z87891 Personal history of nicotine dependence: Secondary | ICD-10-CM | POA: Diagnosis not present

## 2023-08-26 DIAGNOSIS — Z5189 Encounter for other specified aftercare: Secondary | ICD-10-CM | POA: Diagnosis not present

## 2023-08-26 NOTE — Progress Notes (Signed)
 Daily Session Note  Patient Details  Name: [name] MRN: [id number] Date of Birth: February 19, 1956 Referring Provider:   Gattis Kass Supervised Exercise Therapy from 07/23/2023 in [facility] for [facility], Vascular, & Lung [facility]  Referring Provider Dr. Antionette Kirks       Encounter Date: 08/26/2023  Check In:  Session Check In - 08/26/23 0724       Check-In   Supervising physician immediately available to respond to emergencies [facility] immediately available    Physician(s) Slater Duncan, NP    [facility]    Staff Present Pablo Boards BS, ACSM-CEP, Exercise Physiologist;Kaylee Nolon Baxter, MS, ACSM-CEP, Exercise Physiologist;Rayelle Armor Alexia Angelucci, MS, Exercise Physiologist;Carlette Alaine Alken, RN, Emerick Hanlon, MS, Exercise Physiologist    Virtual Visit No    Medication changes reported     No    Fall or balance concerns reported    No    Tobacco Cessation No Change    Current number of cigarettes/nicotine per day     0    Warm-up and Cool-down Performed on first and last piece of equipment   peformed Warm-Up on TMl; [place] as Group   Resistance Training Performed Yes    VAD Patient? No    PAD/SET Patient? Yes      PAD/SET Patient   Completed foot check today? Yes    Open wounds to report? Yes      Pain Assessment   Currently in Pain? No/denies    Multiple Pain Sites No             Capillary Blood Glucose: No results found for this or any previous visit (from the past 24 hours).    Social History   Tobacco Use  Smoking Status Former   Current packs/day: 0.00   Average packs/day: 1.5 packs/day for 38.0 years (57.0 ttl pk-yrs)   Types: Cigarettes   Start date: 04/20/1974   Quit date: 04/20/2012   Years since quitting: 11.3  Smokeless Tobacco Never    Goals Met:  Exercise tolerated well No report of concerns or symptoms today Strength training completed today  Goals Unmet:  Not Applicable  Comments: Pt was  cleared for supervised exercise therapy by Dr. Antionette Kirks  Due to patients hx of CV disease pt is being watched closely and monitored for any unusual signs/symptoms while exercising. Will continue to watch closely, monitor, and report any unusual or unexpected pain through the program.  Pt exercised on the Treadmill  for 26 minutes and experienced 3/5 claudication pain that resolved with sitting rest breaks. Pt tolerated exercise w/o usual and unexpected symptoms. Will continue to monitor and progress through the program following PAD protocol.   Current ExRx 2.51mph at 8% incline.   Initial onset of claudication at: 9:32 Longest exercised time before break: 11:29 Total exercise time/rest time: 26/8  Service time is from 0700 to 0804.    Dr. Gaylyn Keas is Medical Director for Cardiac Rehab at [facility].

## 2023-08-28 ENCOUNTER — Other Ambulatory Visit: Payer: Self-pay | Admitting: Cardiovascular Disease

## 2023-08-28 ENCOUNTER — Encounter (HOSPITAL_COMMUNITY)
Admission: RE | Admit: 2023-08-28 | Discharge: 2023-08-28 | Disposition: A | Discharge status: Home / Self Care | Payer: Self-pay | Source: Ambulatory Visit | Attending: Cardiovascular Disease | Admitting: Cardiovascular Disease

## 2023-08-28 DIAGNOSIS — Z5189 Encounter for other specified aftercare: Secondary | ICD-10-CM | POA: Diagnosis not present

## 2023-08-28 DIAGNOSIS — I70211 Atherosclerosis of native arteries of extremities with intermittent claudication, right leg: Secondary | ICD-10-CM

## 2023-08-28 DIAGNOSIS — Z87891 Personal history of nicotine dependence: Secondary | ICD-10-CM | POA: Diagnosis not present

## 2023-08-28 NOTE — Progress Notes (Signed)
Daily Session Note  Patient Details  Name: [name] MRN: [id number] Date of Birth: 14-Sep-1955 Referring Provider:   Doristine Devoid Supervised Exercise Therapy from 07/23/2023 in [facility] for [facility], Vascular, & Lung [facility]  Referring Provider Dr. Lorine Bears       Encounter Date: 08/28/2023  Check In:  Session Check In - 08/28/23 0700       Check-In   Supervising physician immediately available to respond to emergencies [facility] immediately available    Physician(s) Jari Favre, PA    [facility]    Staff Present Hughie Closs BS, ACSM-CEP, Exercise Physiologist;Kaylee Earlene Plater, MS, ACSM-CEP, Exercise Physiologist;Prestina Raigoza Hale Bogus, MS, Exercise Physiologist;Carlette Les Pou, RN, Cathlean Cower, MS, Exercise Physiologist    Virtual Visit No    Medication changes reported     No    Fall or balance concerns reported    No    Tobacco Cessation No Change    Current number of cigarettes/nicotine per day     0    Warm-up and Cool-down Performed on first and last piece of equipment   peformed Warm-Up on TMl; [place] as Group   Resistance Training Performed Yes    VAD Patient? No    PAD/SET Patient? Yes      PAD/SET Patient   Completed foot check today? No    Open wounds to report? Yes      Pain Assessment   Currently in Pain? No/denies    Multiple Pain Sites No             Capillary Blood Glucose: No results found for this or any previous visit (from the past 24 hours).    Social History   Tobacco Use  Smoking Status Former   Current packs/day: 0.00   Average packs/day: 1.5 packs/day for 38.0 years (57.0 ttl pk-yrs)   Types: Cigarettes   Start date: 04/20/1974   Quit date: 04/20/2012   Years since quitting: 11.3  Smokeless Tobacco Never    Goals Met:  Independence with exercise equipment Exercise tolerated well  Goals Unmet:  Not Applicable  Comments: Pt was cleared for supervised exercise therapy by  Dr. Lorine Bears  Due to patients hx of CV disease pt is being watched closely and monitored for any unusual signs/symptoms while exercising. Will continue to watch closely, monitor, and report any unusual or unexpected pain through the program.  Pt exercised on the Treadmill  for 30 minutes and experienced 3/5 claudication pain that resolved with sitting rest breaks. Pt tolerated exercise w/o usual and unexpected symptoms. Will continue to monitor and progress through the program following PAD protocol.    Initial onset of claudication at: 2:06 Longest exercised time before break: 11:09 Total exercise time/rest time: 30/8  Service time is from 0700 to 0802.    Dr. Armanda Magic is Medical Director for Cardiac Rehab at [facility].

## 2023-08-30 ENCOUNTER — Encounter (HOSPITAL_COMMUNITY)
Admission: RE | Admit: 2023-08-30 | Discharge: 2023-08-30 | Disposition: A | Discharge status: Home / Self Care | Payer: Medicare Other | Source: Ambulatory Visit | Attending: Cardiovascular Disease | Admitting: Cardiovascular Disease

## 2023-08-30 DIAGNOSIS — Z87891 Personal history of nicotine dependence: Secondary | ICD-10-CM | POA: Diagnosis not present

## 2023-08-30 DIAGNOSIS — Z5189 Encounter for other specified aftercare: Secondary | ICD-10-CM | POA: Diagnosis not present

## 2023-08-30 DIAGNOSIS — I70211 Atherosclerosis of native arteries of extremities with intermittent claudication, right leg: Secondary | ICD-10-CM | POA: Diagnosis not present

## 2023-08-30 NOTE — Progress Notes (Signed)
Daily Session Note  Patient Details  Name: [name] MRN: [id number] Date of Birth: Mar 15, 1956 Referring Provider:   Doristine Devoid Supervised Exercise Therapy from 07/23/2023 in [facility] for Heart, Vascular, & Lung Health  Referring Provider Dr. Lorine Bears       Encounter Date: 08/30/2023  Check In:  Session Check In - 08/30/23 0716       Check-In   Supervising physician immediately available to respond to emergencies [facility] immediately available    Physician(s) Jari Favre, PA    [facility]    Staff Present Raford Pitcher, MS, ACSM-CEP, Exercise Physiologist;Lynsi Dooner Hale Bogus, MS, Exercise Physiologist;Carlette Les Pou, RN, Cathlean Cower, MS, Exercise Physiologist    Virtual Visit No    Medication changes reported     No    Fall or balance concerns reported    No    Tobacco Cessation No Change    Current number of cigarettes/nicotine per day     0    Warm-up and Cool-down Performed on first and last piece of equipment   peformed Warm-Up on TMl; [place] as Group   Resistance Training Performed Yes    VAD Patient? No    PAD/SET Patient? No      PAD/SET Patient   Completed foot check today? Yes    Open wounds to report? No      Pain Assessment   Currently in Pain? No/denies    Multiple Pain Sites No             Capillary Blood Glucose: No results found for this or any previous visit (from the past 24 hours).    Social History   Tobacco Use  Smoking Status Former   Current packs/day: 0.00   Average packs/day: 1.5 packs/day for 38.0 years (57.0 ttl pk-yrs)   Types: Cigarettes   Start date: 04/20/1974   Quit date: 04/20/2012   Years since quitting: 11.3  Smokeless Tobacco Never    Goals Met:  Independence with exercise equipment Exercise tolerated well No report of concerns or symptoms today Strength training completed today  Goals Unmet:  Not Applicable  Comments: Pt was cleared for  supervised exercise therapy by Dr. Lorine Bears  Due to patients hx of CV disease pt is being watched closely and monitored for any unusual signs/symptoms while exercising. Will continue to watch closely, monitor, and report any unusual or unexpected pain through the program.  Pt exercised on the Treadmill  for 35 minutes and experienced 3/5 claudication pain that resolved with sitting rest breaks. Pt tolerated exercise w/o usual and unexpected symptoms. Will continue to monitor and progress through the program following PAD protocol.    Initial onset of claudication at: 9:45 Longest exercised time before break: 13:04 Total exercise time/rest time: 35/6  Pt tolerating current ExRx very well, will increase incline 1% on Monday. Pt plans to walk this weekend if the weather is favorable and read over education sheet on nutrition.    Service time is from 0700 to 0810.    Dr. Armanda Magic is Medical Director for Cardiac Rehab at [facility]

## 2023-09-02 ENCOUNTER — Encounter (HOSPITAL_COMMUNITY)
Admission: RE | Admit: 2023-09-02 | Discharge: 2023-09-02 | Disposition: A | Discharge status: Home / Self Care | Payer: Self-pay | Source: Ambulatory Visit | Attending: Cardiovascular Disease | Admitting: Cardiovascular Disease

## 2023-09-02 DIAGNOSIS — I70211 Atherosclerosis of native arteries of extremities with intermittent claudication, right leg: Secondary | ICD-10-CM

## 2023-09-02 DIAGNOSIS — Z5189 Encounter for other specified aftercare: Secondary | ICD-10-CM | POA: Diagnosis not present

## 2023-09-02 DIAGNOSIS — Z87891 Personal history of nicotine dependence: Secondary | ICD-10-CM | POA: Diagnosis not present

## 2023-09-02 NOTE — Progress Notes (Signed)
 Daily Session Note  Patient Details  Name: [name] MRN: [id number] Date of Birth: [birth year] Referring Provider:   Doristine Devoid Supervised Exercise Therapy from 07/23/2023 in [facility] for Heart, Vascular, & Lung Health  Referring Provider Dr. Lorine Bears       Encounter Date: 09/02/2023  Check In:  Session Check In - 09/02/23 7829       Check-In   Supervising physician immediately available to respond to emergencies [facility] immediately available    Physician(s) Eligha Bridegroom, NP    [facility]    Staff Present Raford Pitcher, MS, ACSM-CEP, Exercise Physiologist;Annedrea Remus Loffler, RN, MHA;Olinty Peggye Pitt, MS, ACSM-CEP, Exercise Physiologist;Jetta Walker BS, ACSM-CEP, Exercise Physiologist    Virtual Visit No    Medication changes reported     No    Fall or balance concerns reported    No    Tobacco Cessation No Change    Warm-up and Cool-down Performed on first and last piece of equipment   peformed Warm-Up on TMl; [place] as Group   Resistance Training Performed Yes    VAD Patient? No    PAD/SET Patient? No      PAD/SET Patient   Completed foot check today? Yes    Open wounds to report? No      Pain Assessment   Currently in Pain? No/denies    Multiple Pain Sites No             Capillary Blood Glucose: No results found for this or any previous visit (from the past 24 hours).   Exercise Prescription Changes - 09/02/23 0800       Response to Exercise   Blood Pressure (Admit) 130/72    Blood Pressure (Exercise) 132/70    Blood Pressure (Exit) 100/64    Heart Rate (Admit) 78 bpm    Heart Rate (Exercise) 106 bpm    Heart Rate (Exit) 82 bpm    Oxygen Saturation (Admit) 97 %    Oxygen Saturation (Exercise) 97 %    Oxygen Saturation (Exit) 99 %    Rating of Perceived Exertion (Exercise) 13    Symptoms 3/5 claudication    Comments Incline increased by 1%    Duration Progress to 10 minutes  continuous walking  at current work load and total walking time to 30-45 min    Intensity THRR unchanged      Progression   Progression Continue to follow PAD protocol    Average METs 4.8      Resistance Training   Training Prescription Yes    Weight 4lbs    Reps 10-15    Time 5 Minutes      Treadmill   MPH 2    Grade 9    Minutes 30.5    METs 4.8             Social History   Tobacco Use  Smoking Status Former   Current packs/day: 0.00   Average packs/day: 1.5 packs/day for 38.0 years (57.0 ttl pk-yrs)   Types: Cigarettes   Start date: 04/20/1974   Quit date: 04/20/2012   Years since quitting: 11.3  Smokeless Tobacco Never    Goals Met:  Independence with exercise equipment Exercise tolerated well No report of concerns or symptoms today Strength training completed today  Goals Unmet:  Not Applicable  Comments: Pt was cleared for supervised exercise therapy by Dr. Lorine Bears  Due to patients hx of CV disease pt is being  watched closely and monitored for any unusual signs/symptoms while exercising. Will continue to watch closely, monitor, and report any unusual or unexpected pain through the program.  Pt exercised on the Treadmill  for 30.5 minutes and experienced 3/5 claudication pain that resolved with sitting rest breaks. Pt tolerated exercise w/o usual and unexpected symptoms. Will continue to monitor and progress through the program following PAD protocol.    Initial onset of claudication at: 10:03 Longest exercised time before break: 10:24 Total exercise time/rest time: 30.5/2  Pt tolerating current ExRx very well, will increase incline 1% on Monday.   Service time is from 0703 to 0800.   Dr. Armanda Magic is Medical Director for Cardiac Rehab at [facility].

## 2023-09-04 ENCOUNTER — Encounter (HOSPITAL_COMMUNITY)
Admission: RE | Admit: 2023-09-04 | Discharge: 2023-09-04 | Disposition: A | Discharge status: Home / Self Care | Payer: Self-pay | Source: Ambulatory Visit | Attending: Cardiovascular Disease | Admitting: Cardiovascular Disease

## 2023-09-04 DIAGNOSIS — I70211 Atherosclerosis of native arteries of extremities with intermittent claudication, right leg: Secondary | ICD-10-CM

## 2023-09-04 DIAGNOSIS — Z87891 Personal history of nicotine dependence: Secondary | ICD-10-CM | POA: Diagnosis not present

## 2023-09-04 DIAGNOSIS — Z5189 Encounter for other specified aftercare: Secondary | ICD-10-CM | POA: Diagnosis not present

## 2023-09-04 NOTE — Progress Notes (Signed)
 Daily Session Note  Patient Details  Name: [name] MRN: [id number] Date of Birth: 06/21/1956 Referring Provider:   Doristine Devoid Supervised Exercise Therapy from 07/23/2023 in [facility] for Heart, Vascular, & Lung Health  Referring Provider Dr. Lorine Bears       Encounter Date: 09/04/2023  Check In:  Session Check In - 09/04/23 0704       Check-In   Supervising physician immediately available to respond to emergencies [facility] immediately available    Physician(s) Carlos Levering, NP    Location [facility]    Staff Present Raford Pitcher, MS, ACSM-CEP, Exercise Physiologist;Annedrea Remus Loffler, RN, MHA;Jetta Walker BS, ACSM-CEP, Exercise Physiologist;Amiayah Giebel Hale Bogus, MS, Exercise Physiologist;Bailey Wallace Cullens, MS, Exercise Physiologist    Virtual Visit No    Medication changes reported     No    Fall or balance concerns reported    No    Tobacco Cessation No Change    Warm-up and Cool-down Performed on first and last piece of equipment   peformed Warm-Up on TMl; [place] as Group   Resistance Training Performed No    VAD Patient? No    PAD/SET Patient? No      PAD/SET Patient   Completed foot check today? Yes    Open wounds to report? No      Pain Assessment   Currently in Pain? No/denies    Multiple Pain Sites No             Capillary Blood Glucose: No results found for this or any previous visit (from the past 24 hours).    Social History   Tobacco Use  Smoking Status Former   Current packs/day: 0.00   Average packs/day: 1.5 packs/day for 38.0 years (57.0 ttl pk-yrs)   Types: Cigarettes   Start date: 04/20/1974   Quit date: 04/20/2012   Years since quitting: 11.3  Smokeless Tobacco Never    Goals Met:  Independence with exercise equipment Exercise tolerated well No report of concerns or symptoms today  Goals Unmet:  Not Applicable  Comments: Pt was cleared for supervised exercise therapy by Dr.  Lorine Bears  Due to patients hx of CV disease pt is being watched closely and monitored for any unusual signs/symptoms while exercising. Will continue to watch closely, monitor, and report any unusual or unexpected pain through the program.  Pt exercised on the Treadmill  for 25 minutes and experienced 3/5 claudication pain that resolved with sitting rest breaks. Pt tolerated exercise w/o usual and unexpected symptoms. Will continue to monitor and progress through the program following PAD protocol.    Initial onset of claudication at: 9:00 Longest exercised time before break: 11:15 Total exercise time/rest time: 25/5  Service time is from 0700 to 0758.    Dr. Armanda Magic is Medical Director for Cardiac Rehab at [facility]

## 2023-09-06 ENCOUNTER — Encounter (HOSPITAL_COMMUNITY)
Admission: RE | Admit: 2023-09-06 | Discharge: 2023-09-06 | Disposition: A | Discharge status: Home / Self Care | Payer: Self-pay | Source: Ambulatory Visit | Attending: Cardiovascular Disease | Admitting: Cardiovascular Disease

## 2023-09-06 DIAGNOSIS — I70211 Atherosclerosis of native arteries of extremities with intermittent claudication, right leg: Secondary | ICD-10-CM | POA: Diagnosis not present

## 2023-09-06 DIAGNOSIS — Z5189 Encounter for other specified aftercare: Secondary | ICD-10-CM | POA: Diagnosis not present

## 2023-09-06 DIAGNOSIS — Z87891 Personal history of nicotine dependence: Secondary | ICD-10-CM | POA: Diagnosis not present

## 2023-09-06 NOTE — Progress Notes (Signed)
 Daily Session Note  Patient Details  Name: [name] MRN: [id number] Date of Birth: 12/23/1955 Referring Provider:   Doristine Devoid Supervised Exercise Therapy from 07/23/2023 in [facility] for Heart, Vascular, & Lung Health  Referring Provider Dr. Lorine Bears       Encounter Date: 09/06/2023  Check In:  Session Check In - 09/06/23 4098       Check-In   Supervising physician immediately available to respond to emergencies [facility] immediately available    Physician(s) Joni Reining, NP    Location [facility]    Staff Present Raford Pitcher, MS, ACSM-CEP, Exercise Physiologist;Annedrea Remus Loffler, RN, MHA;Jetta Walker BS, ACSM-CEP, Exercise Physiologist;Hedy Garro Hale Bogus, MS, Exercise Physiologist;Bailey Wallace Cullens, MS, Exercise Physiologist    Virtual Visit No    Medication changes reported     No    Fall or balance concerns reported    No    Tobacco Cessation No Change    Warm-up and Cool-down Performed on first and last piece of equipment   peformed Warm-Up on TMl; [place] as Group   Resistance Training Performed Yes    VAD Patient? No    PAD/SET Patient? Yes      PAD/SET Patient   Completed foot check today? Yes    Open wounds to report? No      Pain Assessment   Currently in Pain? No/denies    Multiple Pain Sites No             Capillary Blood Glucose: No results found for this or any previous visit (from the past 24 hours).   Exercise Prescription Changes - 09/06/23 0800       Response to Exercise   Blood Pressure (Admit) 108/66    Blood Pressure (Exit) 106/56    Heart Rate (Admit) 75 bpm    Heart Rate (Exercise) 90 bpm    Heart Rate (Exit) 71 bpm    Rating of Perceived Exertion (Exercise) 15    Symptoms 3/5 claudication; calf/foot cramps    Comments Attempted to increase incline 1%    Duration Progress to 10 minutes continuous walking  at current work load and total walking time to 30-45 min    Intensity THRR  unchanged      Progression   Progression Continue to follow PAD protocol    Average METs 5.3      Resistance Training   Training Prescription Yes    Weight 4lbs    Reps 10-15    Time 5 Minutes      Treadmill   MPH 2    Grade 10    Minutes 11    METs 5.3             Social History   Tobacco Use  Smoking Status Former   Current packs/day: 0.00   Average packs/day: 1.5 packs/day for 38.0 years (57.0 ttl pk-yrs)   Types: Cigarettes   Start date: 04/20/1974   Quit date: 04/20/2012   Years since quitting: 11.3  Smokeless Tobacco Never    Goals Met:  Independence with exercise equipment Exercise tolerated well Strength training completed today  Goals Unmet:  Pt unable to tolerate new ExRx d/t right calf cramping that resolved with rest. Pt given mustard and water and was able to tolerate  walking at 2.0/2.0  Comments: Pt was cleared for supervised exercise therapy by Dr. Lorine Bears  Due to patients hx of CV disease pt is being watched closely and monitored for any  unusual signs/symptoms while exercising. Will continue to watch closely, monitor, and report any unusual or unexpected pain through the program.  Pt exercised on the Treadmill  for 22 minutes and experienced 3/5 claudication pain that resolved with sitting rest breaks. Pt tolerated exercise w/o usual and unexpected symptoms. Will continue to monitor and progress through the program following PAD protocol.    Initial onset of claudication at: 9:00 Longest exercised time before break: 10:34 Total exercise time/rest time: 22/16  Service time is from 0700 to 0800.    Dr. Armanda Magic is Medical Director for Cardiac Rehab at [facility].

## 2023-09-09 ENCOUNTER — Encounter (HOSPITAL_COMMUNITY)
Admission: RE | Admit: 2023-09-09 | Discharge: 2023-09-09 | Disposition: A | Discharge status: Home / Self Care | Payer: Self-pay | Source: Ambulatory Visit | Attending: Cardiovascular Disease | Admitting: Cardiovascular Disease

## 2023-09-09 DIAGNOSIS — I70211 Atherosclerosis of native arteries of extremities with intermittent claudication, right leg: Secondary | ICD-10-CM | POA: Diagnosis not present

## 2023-09-09 DIAGNOSIS — Z5189 Encounter for other specified aftercare: Secondary | ICD-10-CM | POA: Diagnosis not present

## 2023-09-09 DIAGNOSIS — Z87891 Personal history of nicotine dependence: Secondary | ICD-10-CM | POA: Diagnosis not present

## 2023-09-09 NOTE — Progress Notes (Signed)
 Daily Session Note  Patient Details  Name: [name] MRN: [id number] Date of Birth: Oct 18, 1955 Referring Provider:   Doristine Devoid Supervised Exercise Therapy from 07/23/2023 in [facility]  Referring Provider Dr. Lorine Bears       Encounter Date: 09/09/2023  Check In:  Session Check In - 09/09/23 0706       Check-In   Supervising physician immediately available to respond to emergencies [facility] MD immediately available    Physician(s) Robin Searing, NP    Location [facility]    Staff Present Raford Pitcher, MS, ACSM-CEP, Exercise Physiologist;Annedrea Remus Loffler, RN, MHA;Jetta Walker BS, ACSM-CEP, Exercise Physiologist;Chaz Ronning Hale Bogus, MS, Exercise Physiologist    Virtual Visit No    Medication changes reported     No    Fall or balance concerns reported    No    Tobacco Cessation No Change    Warm-up and Cool-down Performed on first and last piece of equipment   peformed Warm-Up on TMl; [place] as Group   Resistance Training Performed Yes    VAD Patient? No    PAD/SET Patient? No      PAD/SET Patient   Completed foot check today? Yes    Open wounds to report? No      Pain Assessment   Currently in Pain? No/denies    Multiple Pain Sites No             Capillary Blood Glucose: No results found for this or any previous visit (from the past 24 hours).    Social History   Tobacco Use  Smoking Status Former   Current packs/day: 0.00   Average packs/day: 1.5 packs/day for 38.0 years (57.0 ttl pk-yrs)   Types: Cigarettes   Start date: 04/20/1974   Quit date: 04/20/2012   Years since quitting: 11.3  Smokeless Tobacco Never    Goals Met:  Independence with exercise equipment Exercise tolerated well No report of concerns or symptoms today Strength training completed today  Goals Unmet:  Not Applicable  Comments: Pt was cleared for supervised exercise therapy by Dr. Lorine Bears   Due to patients hx of CV disease pt is being watched closely and monitored for any unusual signs/symptoms while exercising. Will continue to watch closely, monitor, and report any unusual or unexpected pain through the program.  Pt exercised on the Treadmill  for 30 minutes and experienced 3/5 claudication pain that resolved with sitting rest breaks. Pt tolerated exercise w/o usual and unexpected symptoms. Will continue to monitor and progress through the program following PAD protocol.    Initial onset of claudication at: 9:00 Longest exercised time before break: 13:44 Total exercise time/rest time: 30/7  Service time is from 0700 to 0800.    Dr. Armanda Magic is Medical Director for Cardiac Rehab at [facility].

## 2023-09-11 ENCOUNTER — Encounter (HOSPITAL_COMMUNITY)
Admission: RE | Admit: 2023-09-11 | Discharge: 2023-09-11 | Disposition: A | Discharge status: Home / Self Care | Payer: Self-pay | Source: Ambulatory Visit | Attending: Cardiovascular Disease | Admitting: Cardiovascular Disease

## 2023-09-11 ENCOUNTER — Telehealth: Payer: Self-pay

## 2023-09-11 DIAGNOSIS — I70211 Atherosclerosis of native arteries of extremities with intermittent claudication, right leg: Secondary | ICD-10-CM | POA: Diagnosis not present

## 2023-09-11 DIAGNOSIS — Z5189 Encounter for other specified aftercare: Secondary | ICD-10-CM | POA: Diagnosis not present

## 2023-09-11 DIAGNOSIS — Z87891 Personal history of nicotine dependence: Secondary | ICD-10-CM | POA: Diagnosis not present

## 2023-09-11 NOTE — Telephone Encounter (Signed)
   Pre-operative Risk Assessment    Patient Name: [name]  DOB: 1956-01-20 MRN: [id number]   Date of last office visit: 06/10/24 M. Kirke Corin, MD Date of next office visit: NA   Request for Surgical Clearance    Procedure:   Colonscopy  ( Hx of colon polyps)  Date of Surgery:  Clearance 10/28/23                                 Surgeon:  Dr. Kerin Salen Surgeon's Group or Practice Name:  [facility] Phone number:  [phone] Fax number:  [phone]   Type of Clearance Requested:   - Medical  - Pharmacy:  Hold Aspirin 81 mg    Type of Anesthesia:   propofol   Additional requests/questions:    Elyse Jarvis   09/11/2023, 3:21 PM

## 2023-09-11 NOTE — Progress Notes (Signed)
 Daily Session Note  Patient Details  Name: [name] MRN: [id number] Date of Birth: 05/28/1956 Referring Provider:   Doristine Devoid Supervised Exercise Therapy from 07/23/2023 in [facility]  Referring Provider Dr. Lorine Bears       Encounter Date: 09/11/2023  Check In:  Session Check In - 09/11/23 0708       Check-In   Supervising physician immediately available to respond to emergencies [facility] MD immediately available    Physician(s) Bartolo Darter, NP    Location [facility]    Staff Present Raford Pitcher, MS, ACSM-CEP, Exercise Physiologist;Annedrea Remus Loffler, RN, MHA;Jetta Walker BS, ACSM-CEP, Exercise Physiologist;Darcey Demma Hale Bogus, MS, Exercise Physiologist;Bailey Wallace Cullens, MS, Exercise Physiologist    Virtual Visit No    Medication changes reported     No    Fall or balance concerns reported    No    Tobacco Cessation No Change    Warm-up and Cool-down Performed on first and last piece of equipment   peformed Warm-Up on TMl; [place]own as Group   Resistance Training Performed No    VAD Patient? No    PAD/SET Patient? Yes      PAD/SET Patient   Completed foot check today? Yes    Open wounds to report? No      Pain Assessment   Currently in Pain? No/denies    Multiple Pain Sites No             Capillary Blood Glucose: No results found for this or any previous visit (from the past 24 hours).    Social History   Tobacco Use  Smoking Status Former   Current packs/day: 0.00   Average packs/day: 1.5 packs/day for 38.0 years (57.0 ttl pk-yrs)   Types: Cigarettes   Start date: 04/20/1974   Quit date: 04/20/2012   Years since quitting: 11.4  Smokeless Tobacco Never    Goals Met:  Independence with exercise equipment Exercise tolerated well No report of concerns or symptoms today  Goals Unmet:  Not Applicable  Comments: Pt was cleared for supervised exercise therapy by Dr. Lorine Bears  Due to patients hx of CV disease pt is being watched closely and monitored for any unusual signs/symptoms while exercising. Will continue to watch closely, monitor, and report any unusual or unexpected pain through the program.  Pt exercised on the Treadmill  for 28 minutes and experienced 3/5 claudication pain that resolved with sitting rest breaks. Pt tolerated exercise w/o usual and unexpected symptoms. Will continue to monitor and progress through the program following PAD protocol.    Initial onset of claudication at: 11:15 Longest exercised time before break: 15:32 Total exercise time/rest time: 28/6  Service time is from 0700 to 0800.    Dr. Armanda Magic is Medical Director for Cardiac Rehab at [facility].

## 2023-09-12 NOTE — Telephone Encounter (Signed)
 Left VM regarding scheduling a televist for pre-op clearance.

## 2023-09-12 NOTE — Telephone Encounter (Signed)
   Name: [name]  DOB: Nov 02, 1955  MRN: [id number]  Primary Cardiologist: Verne Carrow, MD   Preoperative team, please contact this patient and set up a phone call appointment for further preoperative risk assessment. Please obtain consent and complete medication review. Thank you for your help.  I confirm that guidance regarding antiplatelet and oral anticoagulation therapy has been completed and, if necessary, noted below.  Per Dr. Clifton James, pt may hold Aspirin for 5-7 days prior to procedure. Please resume Aspirin as soon as possible postprocedure, at the discretion of the surgeon.   I also confirmed the patient resides in the state of [place]. As per [facility] Medical Board telemedicine laws, the patient must reside in the state in which the provider is licensed.   Joylene Grapes, NP 09/12/2023, 3:12 PM [facility]

## 2023-09-13 ENCOUNTER — Telehealth: Payer: Self-pay

## 2023-09-13 ENCOUNTER — Encounter (HOSPITAL_COMMUNITY)
Admission: RE | Admit: 2023-09-13 | Discharge: 2023-09-13 | Disposition: A | Discharge status: Home / Self Care | Payer: Medicare Other | Source: Ambulatory Visit | Attending: Cardiovascular Disease | Admitting: Cardiovascular Disease

## 2023-09-13 DIAGNOSIS — I70211 Atherosclerosis of native arteries of extremities with intermittent claudication, right leg: Secondary | ICD-10-CM | POA: Diagnosis not present

## 2023-09-13 DIAGNOSIS — Z5189 Encounter for other specified aftercare: Secondary | ICD-10-CM | POA: Diagnosis not present

## 2023-09-13 DIAGNOSIS — Z87891 Personal history of nicotine dependence: Secondary | ICD-10-CM | POA: Diagnosis not present

## 2023-09-13 NOTE — Telephone Encounter (Signed)
 Called patient to set up a Tele Visit for pre-op clearance on 10/15/23 @ 9:00, Meds Rec and consent done

## 2023-09-13 NOTE — Progress Notes (Signed)
 Daily Session Note  Patient Details  Name: [name] MRN: [id number] Date of Birth: June 17, 1956 Referring Provider:   Doristine Devoid Supervised Exercise Therapy from 07/23/2023 in [facility]  Referring Provider Dr. Lorine Bears       Encounter Date: 09/13/2023  Check In:  Session Check In - 09/13/23 0700       Check-In   Supervising physician immediately available to respond to emergencies [facility] MD immediately available    Physician(s) Bartolo Darter, NP    [facility]    Staff Present Raford Pitcher, MS, ACSM-CEP, Exercise Physiologist;Jetta Dan Humphreys BS, ACSM-CEP, Exercise Physiologist;Amada Hallisey Hale Bogus, MS, Exercise Physiologist;Carlette Les Pou, RN, BSN    Virtual Visit No    Medication changes reported     No    Fall or balance concerns reported    No    Tobacco Cessation No Change    Warm-up and Cool-down Performed on first and last piece of equipment   peformed Warm-Up on TMl; [place] as Group   Resistance Training Performed No    VAD Patient? No    PAD/SET Patient? Yes      PAD/SET Patient   Completed foot check today? Yes    Open wounds to report? No      Pain Assessment   Currently in Pain? No/denies    Multiple Pain Sites No             Capillary Blood Glucose: No results found for this or any previous visit (from the past 24 hours).    Social History   Tobacco Use  Smoking Status Former   Current packs/day: 0.00   Average packs/day: 1.5 packs/day for 38.0 years (57.0 ttl pk-yrs)   Types: Cigarettes   Start date: 04/20/1974   Quit date: 04/20/2012   Years since quitting: 11.4  Smokeless Tobacco Never    Goals Met:  Independence with exercise equipment Exercise tolerated well No report of concerns or symptoms today Strength training completed today  Goals Unmet:  Not Applicable  Comments: Pt was cleared for supervised exercise therapy by Dr. Lorine Bears   Due to patients hx of CV disease pt is being watched closely and monitored for any unusual signs/symptoms while exercising. Will continue to watch closely, monitor, and report any unusual or unexpected pain through the program.  Pt exercised on the Treadmill  for 30 minutes and experienced 3/5 claudication pain that resolved with sitting rest breaks. Pt tolerated exercise w/o usual and unexpected symptoms. Will continue to monitor and progress through the program following PAD protocol.    Initial onset of claudication at: 11:15 Longest exercised time before break: 15:03 Total exercise time/rest time: 30/4  Service time is from 0700 to 0800.     Dr. Armanda Magic is Medical Director for Cardiac Rehab at [facility].

## 2023-09-13 NOTE — Progress Notes (Signed)
 PAD/SET Plan of Care Updates  Patient Details  Name: [name] MRN: [id number] Date of Birth: Jun 09, 1956  Entry Date:  Flowsheet Row Supervised Exercise Therapy from 07/23/2023 in [facility]  Date 07/23/23      Expected Discharge Date:  Flowsheet Row Supervised Exercise Therapy from 07/23/2023 in [facility]  Expected Discharge Date 10/14/23       Medical Diagnosis: Atherosclerosis of native artery of right lower extremity with intermittent claudication [facility])  Encounter Date: 09/13/23  Exercise Target Goals: Exercise Program Goal: Individual exercise prescription set with THRR, safety & activity barriers. Participant demonstrates ability to understand and report RPE using RPE 6-20 scale, to self-measure pulse accurately, and to acknowledge the importance of the exercise prescription.  Exercise Prescription Goal: Use initial exercise assessment to set exercise prescription to improve time and distance to onset of claudication pain. Provide education to aid in steps toward risk factor modification, improve quality of life with claudication, and utilize THRR and exercise prescription for best results for decreasing symptoms of PAD. Prevent injury to bone, joints, and skin integrity during the exercise through checks of each system during session check in. Following physician guidelines for any contraindications to specific exercises.  Activity Barriers:  Activity Barriers & Cardiac Risk Stratification - 07/23/23 0916       Activity Barriers & Cardiac Risk Stratification   Cardiac Risk Stratification High             Initial Exercise Prescription:  Initial Exercise Prescription - 07/23/23 0900       Date of Initial Exercise RX and Referring Provider   Date 07/23/23    Referring Provider Dr. Lorine Bears    Expected Discharge Date 10/14/23       Treadmill   MPH 2    Grade 2    Minutes 30    METs 3.1      Prescription Details   Frequency (times per week) 3 days/week    Duration Progress to 30 minutes of continuous aerobic without signs/symptoms of physical distress      Intensity   THRR 40-80% of Max Heartrate 62-124    Ratings of Perceived Exertion 11-13    Perceived Dyspnea 0-4      Progression   Progression Continue to follow PAD protocol      Resistance Training   Training Prescription Yes    Weight 3lbs    Reps 10-15             Perform Capillary Blood Glucose checks as needed.  Current Exercise Prescription:   Exercise Prescription Changes     Row Name 07/24/23 1300 07/26/23 0900 07/29/23 0800 07/31/23 0800 08/02/23 0800     Response to Exercise   Blood Pressure (Admit) 112/59 138/80 118/64 106/64 128/68   Blood Pressure (Exercise) 142/66 130/66 118/66 134/66 128/70   Blood Pressure (Exit) 116/62 118/60 114/64 110/64 114/72   Heart Rate (Admit) 64 bpm 82 bpm 92 bpm 73 bpm 80 bpm   Heart Rate (Exercise) 99 bpm 98 bpm 108 bpm 103 bpm 100 bpm   Heart Rate (Exit) 71 bpm 65 bpm 87 bpm 76 bpm 71 bpm   Oxygen Saturation (Admit) -- 98 % 99 % 96 % 98 %   Oxygen Saturation (Exercise) -- 97 % 99 % 99 % 95 %   Oxygen Saturation (Exit) -- 97 % 98 % 99 % 99 %  Rating of Perceived Exertion (Exercise) -- 11 11 11 13    Symptoms 3/5 claudication 3/5 claudication 3/5 claudication 3/5 claudication 3/5 claudication   Comments Pt first day -- -- -- Incline increase 1%   Duration Progress to 30 minutes of  aerobic without signs/symptoms of physical distress Progress to 30 minutes of  aerobic without signs/symptoms of physical distress Progress to 30 minutes of  aerobic without signs/symptoms of physical distress Progress to 10 minutes continuous walking  at current work load and total walking time to 30-45 min Progress to 10 minutes continuous walking  at current work load and total walking time to 30-45 min   Intensity  THRR unchanged THRR unchanged THRR unchanged THRR unchanged THRR unchanged     Progression   Progression Continue to follow PAD protocol Continue to follow PAD protocol Continue to follow PAD protocol Continue to follow PAD protocol Continue to follow PAD protocol   Average METs 3.08 3.08 3.08 3.08 3.4     Resistance Training   Training Prescription No  No weights on Wed Yes Yes --  No weights on Wed No  Pt did weights on Thursday at home   Weight -- 3lbs 3lbs -- --   Reps -- 10-15 10-15 -- --     Treadmill   MPH 2 2 2 2 2    Grade 2 2 2 2 3    Minutes 29 22 24  39 30   METs 3.08 3.08 3.08 3.08 3.4    Row Name 08/05/23 0800 08/07/23 0800 08/09/23 0800 08/12/23 0800 08/14/23 0700     Response to Exercise   Blood Pressure (Admit) 100/62 100/60 122/70 118/64 110/64   Blood Pressure (Exercise) 126/68 130/68 110/70 126/66 --  D/C blood pressures while exs   Blood Pressure (Exit) 122/62 122/72 118/70 106/66 100/60   Heart Rate (Admit) 69 bpm 73 bpm 80 bpm 83 bpm 81 bpm   Heart Rate (Exercise) 101 bpm 102 bpm 101 bpm 106 bpm 101 bpm   Heart Rate (Exit) 70 bpm 74 bpm 80 bpm 78 bpm 78 bpm   Oxygen Saturation (Admit) 98 % 98 % 98 % 100 % 98 %   Oxygen Saturation (Exercise) 97 % 99 % 98 % 98 % 98 %   Oxygen Saturation (Exit) 97 % 97 % 99 % 99 % 99 %   Rating of Perceived Exertion (Exercise) 13 13 11 15 13    Symptoms 3/5 claudication 3/5 claudication 3/5 claudication 3/5 claudication 3/5 claudication   Comments Pt walking over 10 minutes w/o rest. Will increase incline next session. Increase wts to 4lbs. INC incline by 1% -- INC incline by 1% --   Duration Continue with 30 min of aerobic exercise without signs/symptoms of physical distress. Progress to 10 minutes continuous walking  at current work load and total walking time to 30-45 min Continue with 30 min of aerobic exercise without signs/symptoms of physical distress. Progress to 10 minutes continuous walking  at current work load and total  walking time to 30-45 min Progress to 30 minutes of  aerobic without signs/symptoms of physical distress   Intensity THRR unchanged THRR unchanged THRR unchanged THRR unchanged THRR unchanged     Progression   Progression Continue to follow PAD protocol Continue to follow PAD protocol Continue to follow PAD protocol Continue to follow PAD protocol Continue to follow PAD protocol   Average METs 3.4 3.6 3.6 3.6 3.9     Resistance Training   Training Prescription Yes No  No weights  on Wed Yes Yes No   Weight 4lbs -- 4lbs 4lbs --   Reps 10-15 -- 10-15 10-15 --     Treadmill   MPH 2 2 2 2 2    Grade 3 4 4 5 5    Minutes 34 25 32 29 24   METs 3.4 3.6 3.6 3.9 3.9    Row Name 08/16/23 0800 08/19/23 0800 08/21/23 0800 08/23/23 0800 09/02/23 0800     Response to Exercise   Blood Pressure (Admit) 100/64 110/68 100/66 114/66 130/72   Blood Pressure (Exercise) -- -- -- -- 132/70   Blood Pressure (Exit) 112/72 100/70 100/62 96/64 100/64   Heart Rate (Admit) 76 bpm 81 bpm 84 bpm 76 bpm 78 bpm   Heart Rate (Exercise) 108 bpm 109 bpm 102 bpm 106 bpm 106 bpm   Heart Rate (Exit) 82 bpm 81 bpm 71 bpm 72 bpm 82 bpm   Oxygen Saturation (Admit) 96 % 99 % -- -- 97 %   Oxygen Saturation (Exercise) 93 % 98 % -- -- 97 %   Oxygen Saturation (Exit) 99 % 99 % -- -- 99 %   Rating of Perceived Exertion (Exercise) 13 13 9 15 13    Symptoms 3/5 claudication 2/5 claudication 3/5 3/5 claudication 3/5 claudication   Comments Incline increased by 1% Incline increased by 1%; pt reports fatigue Pt c/p of calf soreness-reverted back to initial Incline increased by 1% Incline increased by 1%   Duration Progress to 30 minutes of  aerobic without signs/symptoms of physical distress Progress to 30 minutes of  aerobic without signs/symptoms of physical distress Progress to 45 minutes of aerobic exercise without signs/symptoms of physical distress Progress to 10 minutes continuous walking  at current work load and total walking time  to 30-45 min Progress to 10 minutes continuous walking  at current work load and total walking time to 30-45 min   Intensity THRR unchanged THRR unchanged THRR unchanged THRR unchanged THRR unchanged     Progression   Progression Continue to follow PAD protocol Continue to follow PAD protocol Continue to follow PAD protocol Continue to follow PAD protocol Continue to follow PAD protocol   Average METs 4.2 4.5 3.08 4.7 4.8     Resistance Training   Training Prescription Yes Yes No Yes Yes   Weight 4lbs 4lbs --  no weights on Wed 4lbs 4lbs   Reps 10-15 10-15 -- 10-15 10-15   Time -- -- -- -- 5 Minutes     Treadmill   MPH 2 2 2 2 2    Grade 6 7 2 8 9    Minutes 27 31 32 23 30.5   METs 4.2 4.5 3.08 4.7 4.8    Row Name 09/06/23 0800             Response to Exercise   Blood Pressure (Admit) 108/66       Blood Pressure (Exit) 106/56       Heart Rate (Admit) 75 bpm       Heart Rate (Exercise) 90 bpm       Heart Rate (Exit) 71 bpm       Rating of Perceived Exertion (Exercise) 15       Symptoms 3/5 claudication; calf/foot cramps       Comments Attempted to increase incline 1%       Duration Progress to 10 minutes continuous walking  at current work load and total walking time to 30-45 min       Intensity THRR  unchanged         Progression   Progression Continue to follow PAD protocol       Average METs 5.3         Resistance Training   Training Prescription Yes       Weight 4lbs       Reps 10-15       Time 5 Minutes         Treadmill   MPH 2       Grade 10       Minutes 11       METs 5.3                Exercise Comments:   Exercise Comments     Row Name 07/23/23 0918 07/24/23 1215 07/26/23 1036 07/29/23 0818 07/31/23 0830   Exercise Comments Pt orientation, pt exercised w/o unusual s/s. Pt exercised on the treadmill at a speed of 2.0 and incline of 2%. She reported having 3/5 claudication while walking that resolved with sitting rest breaks. No unusual or  unexpected symptoms reported.      Initial onset of claudication: 3 minutes 50 seconds  Longest walk time w/o break: 10 min 25 seconds   Longest break time: 3 minutes   Total Exercise Time/Rest Time: 29 mins 30 seconds/14 minutes Pt exercised on the treadmill at 2.0/2.0%, she reported 3/5 claudication while walking that resolved with sitting rest breaks. No unusual or unexpected symptoms reported.     Initial onset of claudication at 4.5 minutes.   Longest exercised time before break:8 min 12s.   Longest rest: 2 min 6s.   Total exercise time/rest time: 22 minutes/5 minutes Pt exercised on the treadmill at 2.0/2.0% for 24 minutes. She reported 3/5 claudication while walking that resolved with sitting rest breaks. No unusual or unexpected symptoms reported.     Initial onset of claudication at: 7 minutes 51 seconds  Longest exercised time before break:8 min 27s  Longest rest: 2 min  Total exercise time/rest time:  24 min/9 min Pt exercised on the treadmill at 2.0/2.0%, she reported 3/5 claudication while walking that resolved with sitting rest breaks. No unusual or unexpected symptoms reported.     Initial onset of claudication at: 16s  Longest exercised time before break: 06s  Longest rest: 2 min 19  Total exercise time/rest time:  69min/13min    Row Name 08/02/23 0847 08/05/23 0903 08/07/23 0825 08/09/23 0833 08/12/23 0904   Exercise Comments Pt exercised on the Treadmill  for 30 minutes and experienced 3/5 claudication pain that resolved with sitting rest breaks. Today I increased the incline by 1%, pt tolerated exercise w/o usual and unexpected symptoms. Will continue to monitor and progress through the program following PAD protocol.      Initial onset of claudication at: 30s  Longest exercised time before break: 23s  Longest rest: 3 min 52s  Total exercise time/rest time: 29min/11min Pt exercised on the Treadmill  for 34 minutes and experienced 3/5 claudication pain that resolved with  sitting rest breaks. Pt tolerated exercise w/o usual and unexpected symptoms. Pt walking over 10 minutes at current ExRX, will increase incline by 1% next session. Will continue to monitor and progress through the program following PAD protocol. Pt exercised on the Treadmill  for 25 minutes and experienced 3/5 claudication pain that resolved with sitting rest breaks. Pt tolerated exercise w/o usual and unexpected symptoms. Will continue to monitor and progress through the program following  PAD protocol.      Initial onset of claudication at: 3:30  Longest exercised time before break: 11:39  Total exercise time/rest time: 25/9     Service time is from 0711 to 0810. Pt exercised on the Treadmill  for 32 minutes and experienced 3/5 claudication pain that resolved with sitting rest breaks. Pt tolerated exercise w/o usual and unexpected symptoms. Will continue to monitor and progress through the program following PAD protocol.      Will increase incline by 1% on Monday.     Initial onset of claudication at: 4:00  Longest exercised time before break: 11:06  Total exercise time/rest time: 32/8 Pt exercised on the Treadmill  for 29 minutes and experienced 3/5 claudication pain that resolved with sitting rest breaks. Pt tolerated exercise w/o usual and unexpected symptoms. Will continue to monitor and progress through the program following PAD protocol.      Incline increased by 1% today. Pt tolerated exercise well. Will allow more time for her to adjust to current ExRx before increasing incline again. RPE reported at 15    Row Name 08/16/23 0849 08/19/23 0821 08/21/23 0819 09/02/23 0820 09/06/23 9147   Exercise Comments Pt exercised on the Treadmill  for 27 minutes and experienced 3/5 claudication pain that resolved with sitting rest breaks. Pt tolerated exercise w/o usual and unexpected symptoms. Will continue to monitor and progress through the program following PAD protocol.      Incline increased by 1%, pt given  worksheet to track her exercise at home and instructions to walk over the weekend.      Initial onset of claudication at: 0:50  Longest exercised time before break: 12:16  Total exercise time/rest time: 27/8 Pt exercised on the Treadmill  for 31 minutes and experienced 3/5 claudication pain that resolved with sitting rest breaks. Pt tolerated exercise w/o usual and unexpected symptoms. Will continue to monitor and progress through the program following PAD protocol.      Initial onset of claudication at:5:30  Longest exercised time before break: 12:13  Total exercise time/rest time: 31/6 Pt exercised on the Treadmill  for 32 minutes and experienced 3/5 claudication pain that resolved with sitting rest breaks. Pt tolerated exercise w/o usual and unexpected symptoms. Will continue to monitor and progress through the program following PAD protocol.      Pt reported calf soreness after Monday's exercise session, explained to pt that soreness is to be expected and it'll resolve after her body adjust to exercise intensity. Reverted pt to initial exercise prescription to help facilitate her recovery and instructed her to drink plenty of water and continue walking today and tomorrow.      Initial onset of claudication at: 12:03  Longest exercised time before break: 20:37  Total exercise time/rest time: 32/2 Pt tolerating current ExRx even with increase of incline by 1%. Pt exercised on the Treadmill  for 30.5 minutes and experienced 3/5 claudication pain that resolved with sitting rest breaks. Pt tolerated exercise w/o usual and unexpected symptoms. Will continue to monitor and progress through the program following PAD protocol. Initial onset of claudication at: 10:03. Longest exercised time before break: 10:24. Total exercise time/rest time: 30.5/2. Pt unable to tolerate new ExRx d/t right calf cramping that resolved with rest. Pt given mustard and water and was able to tolerate low intensity walking.             Program and Patient Goals Exercise Goals and Review:   Exercise Goals  Row Name 07/23/23 0917             Exercise Goals   Increase Physical Activity Yes       Intervention Provide advice, education, support and counseling about physical activity/exercise needs.;Develop an individualized exercise prescription for aerobic and resistive training based on initial evaluation findings, risk stratification, comorbidities and participant's personal goals.       Expected Outcomes Short Term: Attend rehab on a regular basis to increase amount of physical activity.;Long Term: Add in home exercise to make exercise part of routine and to increase amount of physical activity.;Long Term: Exercising regularly at least 3-5 days a week.       Increase Strength and Stamina Yes       Intervention Provide advice, education, support and counseling about physical activity/exercise needs.;Develop an individualized exercise prescription for aerobic and resistive training based on initial evaluation findings, risk stratification, comorbidities and participant's personal goals.       Expected Outcomes Short Term: Increase workloads from initial exercise prescription for resistance, speed, and METs.;Short Term: Perform resistance training exercises routinely during rehab and add in resistance training at home;Long Term: Improve cardiorespiratory fitness, muscular endurance and strength as measured by increased METs and functional capacity ( )       Able to understand and use rate of perceived exertion (RPE) scale Yes       Intervention Provide education and explanation on how to use RPE scale       Expected Outcomes Short Term: Able to use RPE daily in rehab to express subjective intensity level;Long Term:  Able to use RPE to guide intensity level when exercising independently       Knowledge and understanding of Target Heart Rate Range (THRR) Yes       Intervention Provide education and explanation of THRR  including how the numbers were predicted and where they are located for reference       Expected Outcomes Short Term: Able to state/look up THRR;Long Term: Able to use THRR to govern intensity when exercising independently;Short Term: Able to use daily as guideline for intensity in rehab       Understanding of Exercise Prescription Yes       Intervention Provide education, explanation, and written materials on patient's individual exercise prescription       Expected Outcomes Short Term: Able to explain program exercise prescription;Long Term: Able to explain home exercise prescription to exercise independently       Improve claudication pain toleration; Improve walking ability Yes       Intervention Participate in PAD/SET Rehab 2-3 days a week, walking at home as part of exercise prescription;Attend education sessions to aid in risk factor modification and understanding of disease process       Expected Outcomes Short Term: Improve walking distance/time to onset of claudication pain;Long Term: Improve walking ability and toleration to claudication;Long Term: Improve score of PAD questionnaires                Nutrition:  Target Goals: Understanding of nutrition guidelines, daily intake of sodium 1500mg , cholesterol 200mg , calories 30% from fat and 7% or less from saturated fats, daily to have 5 or more servings of fruits and vegetables.  Biometrics:  Pre Biometrics - 07/24/23 0834       Pre Biometrics   Height 4\' 11"  (1.499 m)    Weight 56.2 kg    Waist Circumference 36 inches    Hip Circumference 38 inches    Waist to  Hip Ratio 0.95 %    BMI (Calculated) 25    Triceps Skinfold 14 mm    % Body Fat 34.9 %    Grip Strength 22 kg    Flexibility 19 in    Single Leg Stand 30 seconds              Nutrition Therapy Plan and Nutrition Goals:   Core Components/Risk Factors/Patient Goals at Admission:   ITP Comments:  ITP Comments     Row Name 07/23/23 0732            ITP Comments Dr Lorine Bears, PAD/SET Referring/Supervising Physician.                Comments: Over the last 30 days Denisa has increased her incline to 10% while maintaining her speed at 2.0. Time to claudication onset has increased to 10 minutes and she is able to walk about 15 minutes at this workload before stopping. Her METs have increased to 5.1 which indicates moderate intensity exercise. Lashara has also added an extra day of walking outside of the program. She is tolerating 4lb weights and flexibility exercises as well at home. I believe it is reasonable for Tonee to continue the program until the end of March as she is still affected by claudication and she does not have a plan to continue exercising at home.   Physician Documentation: Your signature/cosign is required to indicate approval of the Care Plan as stated above. By signing this report, you are approving the Plan of Care. Please sign and either send electronically or print and fax the signed copy to the number below. Please indicate any changes or limitations in the space provided.   ____________________________________________________________________________________________________________________________________________  Physician Signature: _____________________________________________________ Print Physician Name: ____________________________________________________ Date: ___________________________ Time: _________________________________  [facility]  [place] [place] [address] [place], [place] [address] Phone: [phone] Fax: [phone]

## 2023-09-13 NOTE — Telephone Encounter (Signed)
 Called patient to set up a Tele Visit for pre-op clearance on 10/15/23 @ 9:00, Meds Rec and consent done    Patient Consent for Virtual Visit        [name] has provided verbal consent on 09/13/2023 for a virtual visit (video or telephone).   CONSENT FOR VIRTUAL VISIT FOR:  [name]  By participating in this virtual visit I agree to the following:  I hereby voluntarily request, consent and authorize [facility] and its employed or contracted physicians, physician assistants, nurse practitioners or other licensed health care professionals (the Practitioner), to provide me with telemedicine health care services (the "Services") as deemed necessary by the treating Practitioner. I acknowledge and consent to receive the Services by the Practitioner via telemedicine. I understand that the telemedicine visit will involve communicating with the Practitioner through live audiovisual communication technology and the disclosure of certain medical information by electronic transmission. I acknowledge that I have been given the opportunity to request an in-person assessment or other available alternative prior to the telemedicine visit and am voluntarily participating in the telemedicine visit.  I understand that I have the right to withhold or withdraw my consent to the use of telemedicine in the course of my care at any time, without affecting my right to future care or treatment, and that the Practitioner or I may terminate the telemedicine visit at any time. I understand that I have the right to inspect all information obtained and/or recorded in the course of the telemedicine visit and may receive copies of available information for a reasonable fee.  I understand that some of the potential risks of receiving the Services via telemedicine include:  Delay or interruption in medical evaluation due to technological equipment failure or disruption; Information transmitted may not be  sufficient (e.g. poor resolution of images) to allow for appropriate medical decision making by the Practitioner; and/or  In rare instances, security protocols could fail, causing a breach of personal health information.  Furthermore, I acknowledge that it is my responsibility to provide information about my medical history, conditions and care that is complete and accurate to the best of my ability. I acknowledge that Practitioner's advice, recommendations, and/or decision may be based on factors not within their control, such as incomplete or inaccurate data provided by me or distortions of diagnostic images or specimens that may result from electronic transmissions. I understand that the practice of medicine is not an exact science and that Practitioner makes no warranties or guarantees regarding treatment outcomes. I acknowledge that a copy of this consent can be made available to me via my patient portal [facility] MyChart), or I can request a printed copy by calling the office of [facility].    I understand that my insurance will be billed for this visit.   I have read or had this consent read to me. I understand the contents of this consent, which adequately explains the benefits and risks of the Services being provided via telemedicine.  I have been provided ample opportunity to ask questions regarding this consent and the Services and have had my questions answered to my satisfaction. I give my informed consent for the services to be provided through the use of telemedicine in my medical care

## 2023-09-16 ENCOUNTER — Encounter (HOSPITAL_COMMUNITY)
Admission: RE | Admit: 2023-09-16 | Discharge: 2023-09-16 | Disposition: A | Discharge status: Home / Self Care | Payer: Medicare Other | Source: Ambulatory Visit | Attending: Cardiovascular Disease | Admitting: Cardiovascular Disease

## 2023-09-16 DIAGNOSIS — I70211 Atherosclerosis of native arteries of extremities with intermittent claudication, right leg: Secondary | ICD-10-CM | POA: Insufficient documentation

## 2023-09-16 NOTE — Progress Notes (Signed)
 Daily Session Note  Patient Details  Name: [name] MRN: [id number] Date of Birth: 1956-07-13 Referring Provider:   Doristine Devoid Supervised Exercise Therapy from 07/23/2023 in [facility]  Referring Provider Dr. Lorine Bears       Encounter Date: 09/16/2023  Check In:  Session Check In - 09/16/23 0703       Check-In   Supervising physician immediately available to respond to emergencies [facility] MD immediately available    Physician(s) Bartolo Darter, NP    Location [facility]    Staff Present Raford Pitcher, MS, ACSM-CEP, Exercise Physiologist;Jetta Dan Humphreys BS, ACSM-CEP, Exercise Physiologist;Islah Eve Hale Bogus, MS, Exercise Physiologist;Annedrea Remus Loffler, RN, [place]    Virtual Visit No    Medication changes reported     No    Fall or balance concerns reported    No    Tobacco Cessation No Change    Warm-up and Cool-down Performed on first and last piece of equipment   peformed Warm-Up on TMl; [place] as Group   Resistance Training Performed Yes    VAD Patient? No    PAD/SET Patient? No      PAD/SET Patient   Completed foot check today? Yes    Open wounds to report? No      Pain Assessment   Currently in Pain? No/denies    Multiple Pain Sites No             Capillary Blood Glucose: No results found for this or any previous visit (from the past 24 hours).    Social History   Tobacco Use  Smoking Status Former   Current packs/day: 0.00   Average packs/day: 1.5 packs/day for 38.0 years (57.0 ttl pk-yrs)   Types: Cigarettes   Start date: 04/20/1974   Quit date: 04/20/2012   Years since quitting: 11.4  Smokeless Tobacco Never    Goals Met:  Independence with exercise equipment Exercise tolerated well No report of concerns or symptoms today Strength training completed today  Goals Unmet:  Not Applicable  Comments: Pt was cleared for supervised exercise therapy by Dr. Lorine Bears   Due to patients hx of CV disease pt is being watched closely and monitored for any unusual signs/symptoms while exercising. Will continue to watch closely, monitor, and report any unusual or unexpected pain through the program.  Pt exercised on the Treadmill  for 28 minutes and experienced 3/5 claudication pain that resolved with sitting rest breaks. Pt tolerated exercise w/o usual and unexpected symptoms. Will continue to monitor and progress through the program following PAD protocol.    Initial onset of claudication at: 11:34 Longest exercised time before break: 16:05 Total exercise time/rest time: 28/5  Service time is from 0700 to 0804.    Dr. Armanda Magic is Medical Director for Cardiac Rehab at [facility].

## 2023-09-18 ENCOUNTER — Encounter (HOSPITAL_COMMUNITY)
Admission: RE | Admit: 2023-09-18 | Discharge: 2023-09-18 | Disposition: A | Discharge status: Home / Self Care | Payer: Medicare Other | Source: Ambulatory Visit | Attending: Cardiovascular Disease | Admitting: Cardiovascular Disease

## 2023-09-18 DIAGNOSIS — I70211 Atherosclerosis of native arteries of extremities with intermittent claudication, right leg: Secondary | ICD-10-CM | POA: Diagnosis not present

## 2023-09-18 NOTE — Progress Notes (Signed)
 Daily Session Note  Patient Details  Name: [name] MRN: [id number] Date of Birth: 1955/11/29 Referring Provider:   Doristine Devoid Supervised Exercise Therapy from 07/23/2023 in [facility]  Referring Provider Dr. Lorine Bears       Encounter Date: 09/18/2023  Check In:  Session Check In - 09/18/23 5956       Check-In   Supervising physician immediately available to respond to emergencies [facility] MD immediately available    Physician(s) Robin Searing, NP    Location [facility]    Staff Present Raford Pitcher, MS, ACSM-CEP, Exercise Physiologist;Jetta Dan Humphreys BS, ACSM-CEP, Exercise Physiologist;Toron Bowring Hale Bogus, MS, Exercise Physiologist;Annedrea Remus Loffler, RN, Octavia Bruckner, MS, Exercise Physiologist    Virtual Visit No    Medication changes reported     No    Fall or balance concerns reported    No    Tobacco Cessation No Change    Warm-up and Cool-down Performed on first and last piece of equipment   peformed Warm-Up on TMl; [place] as Group   Resistance Training Performed No    VAD Patient? No    PAD/SET Patient? No      PAD/SET Patient   Completed foot check today? Yes    Open wounds to report? No      Pain Assessment   Currently in Pain? No/denies    Multiple Pain Sites No             Capillary Blood Glucose: No results found for this or any previous visit (from the past 24 hours).    Social History   Tobacco Use  Smoking Status Former   Current packs/day: 0.00   Average packs/day: 1.5 packs/day for 38.0 years (57.0 ttl pk-yrs)   Types: Cigarettes   Start date: 04/20/1974   Quit date: 04/20/2012   Years since quitting: 11.4  Smokeless Tobacco Never    Goals Met:  Exercise tolerated well No report of concerns or symptoms today  Goals Unmet:  Not Applicable  Comments: Pt was cleared for supervised exercise therapy by Dr. Lorine Bears  Due to patients hx of CV disease pt  is being watched closely and monitored for any unusual signs/symptoms while exercising. Will continue to watch closely, monitor, and report any unusual or unexpected pain through the program.  Pt exercised on the Treadmill  for 32 minutes and experienced 3/5 claudication pain that resolved with sitting rest breaks. Pt tolerated exercise w/o unusual and unexpected symptoms. Will continue to monitor and progress through the program following PAD protocol.    Initial onset of claudication at:12:30 Longest exercised time before break: 16:11 Total exercise time/rest time: 32/6  Service time is from 0700 to 0802.    Dr. Armanda Magic is Medical Director for Cardiac Rehab at [facility].

## 2023-09-20 ENCOUNTER — Encounter (HOSPITAL_COMMUNITY)
Admission: RE | Admit: 2023-09-20 | Discharge: 2023-09-20 | Disposition: A | Discharge status: Home / Self Care | Payer: Medicare Other | Source: Ambulatory Visit | Attending: Cardiovascular Disease | Admitting: Cardiovascular Disease

## 2023-09-20 VITALS — Ht 59.0 in | Wt 123.0 lb

## 2023-09-20 DIAGNOSIS — I70211 Atherosclerosis of native arteries of extremities with intermittent claudication, right leg: Secondary | ICD-10-CM | POA: Diagnosis not present

## 2023-09-20 NOTE — Progress Notes (Signed)
 Discharge Progress Report  Patient Details  Name: [name] MRN: [id number] Date of Birth: 26-Feb-1956 Referring Provider:   Doristine Devoid Supervised Exercise Therapy from 07/23/2023 in [facility]  Referring Provider Dr. Lorine Bears        Number of Visits: 27  Reason for Discharge:  Early Exit:  Back to work  Smoking History:  Social History   Tobacco Use  Smoking Status Former   Current packs/day: 0.00   Average packs/day: 1.5 packs/day for 38.0 years (57.0 ttl pk-yrs)   Types: Cigarettes   Start date: 04/20/1974   Quit date: 04/20/2012   Years since quitting: 11.4  Smokeless Tobacco Never    Diagnosis:  Atherosclerosis of native artery of right lower extremity with intermittent claudication (HCC)  ADL UCSD:   Initial Exercise Prescription:  Initial Exercise Prescription - 07/23/23 0900       Date of Initial Exercise RX and Referring Provider   Date 07/23/23    Referring Provider Dr. Lorine Bears    Expected Discharge Date 10/14/23      Treadmill   MPH 2    Grade 2    Minutes 30    METs 3.1      Prescription Details   Frequency (times per week) 3 days/week    Duration Progress to 30 minutes of continuous aerobic without signs/symptoms of physical distress      Intensity   THRR 40-80% of Max Heartrate 62-124    Ratings of Perceived Exertion 11-13    Perceived Dyspnea 0-4      Progression   Progression Continue to follow PAD protocol      Resistance Training   Training Prescription Yes    Weight 3lbs    Reps 10-15             Discharge Exercise Prescription (Final Exercise Prescription Changes):  Exercise Prescription Changes - 09/20/23 0800       Response to Exercise   Blood Pressure (Admit) 104/66    Blood Pressure (Exercise) 138/80    Blood Pressure (Exit) 112/70    Heart Rate (Admit) 69 bpm    Heart Rate (Exercise) 110 bpm    Heart Rate (Exit) 88 bpm    Oxygen  Saturation (Admit) 97 %    Oxygen Saturation (Exercise) 91 %    Oxygen Saturation (Exit) 98 %    Rating of Perceived Exertion (Exercise) 12.5    Symptoms 3/5 claudication    Comments Pt PAD Walk TEST      Home Exercise Plan   Plans to continue exercise at [employer] (comment)   [facility], [place], [place], and home   Frequency Add 2 additional days to program exercise sessions.    Initial Home Exercises Provided 09/20/23             Functional Capacity:   Psychological, QOL, Others - Outcomes: PHQ 2/9:     No data to display          Quality of Life:  Quality of Life - 09/20/23 0827       Quality of Life   Select PAD/SET Quality of Life      PAD/SET Quality of Life Scores   Social Relationships and Interactions Pre 42    Social Relationships and Interactions Post 49    Social Relationships and Interactions % Change 16.67 %    Self-Concepts and Feelings Pre 33    Self-Concepts and Feelings Post 38  Self-Concepts and Feelings % Change 15.15 %    Symptoms and Limitations Pre 15    Symptoms and Limitations Post 37    Symptoms and Limitations % Change 146.67 %    Fear and Uncertainty Pre 4    Fear and Uncertainty Post 23    Fear and Uncertainty % Change 475 %    Positive Adaptation Pre 20    Positive Adaptation Post 23    Positive Adaptation % Change 15 %    Job Pre 80    Job Post 80    Job % Change 0 %    Sex Pre 0    Sex Post 16    Intimate Relationships Pre 80    Intimate Relationships Post 80    Intimate Relationsips % Change 0 %             Personal Goals: Goals established at orientation with interventions provided to work toward goal.    Personal Goals Discharge:   Exercise Goals and Review:  Exercise Goals     Row Name 07/23/23 0917             Exercise Goals   Increase Physical Activity Yes       Intervention Provide advice, education, support and counseling about physical activity/exercise needs.;Develop an  individualized exercise prescription for aerobic and resistive training based on initial evaluation findings, risk stratification, comorbidities and participant's personal goals.       Expected Outcomes Short Term: Attend rehab on a regular basis to increase amount of physical activity.;Long Term: Add in home exercise to make exercise part of routine and to increase amount of physical activity.;Long Term: Exercising regularly at least 3-5 days a week.       Increase Strength and Stamina Yes       Intervention Provide advice, education, support and counseling about physical activity/exercise needs.;Develop an individualized exercise prescription for aerobic and resistive training based on initial evaluation findings, risk stratification, comorbidities and participant's personal goals.       Expected Outcomes Short Term: Increase workloads from initial exercise prescription for resistance, speed, and METs.;Short Term: Perform resistance training exercises routinely during rehab and add in resistance training at home;Long Term: Improve cardiorespiratory fitness, muscular endurance and strength as measured by increased METs and functional capacity ( )       Able to understand and use rate of perceived exertion (RPE) scale Yes       Intervention Provide education and explanation on how to use RPE scale       Expected Outcomes Short Term: Able to use RPE daily in rehab to express subjective intensity level;Long Term:  Able to use RPE to guide intensity level when exercising independently       Knowledge and understanding of Target Heart Rate Range (THRR) Yes       Intervention Provide education and explanation of THRR including how the numbers were predicted and where they are located for reference       Expected Outcomes Short Term: Able to state/look up THRR;Long Term: Able to use THRR to govern intensity when exercising independently;Short Term: Able to use daily as guideline for intensity in rehab        Understanding of Exercise Prescription Yes       Intervention Provide education, explanation, and written materials on patient's individual exercise prescription       Expected Outcomes Short Term: Able to explain program exercise prescription;Long Term: Able to explain home exercise prescription to exercise independently  Improve claudication pain toleration; Improve walking ability Yes       Intervention Participate in PAD/SET Rehab 2-3 days a week, walking at home as part of exercise prescription;Attend education sessions to aid in risk factor modification and understanding of disease process       Expected Outcomes Short Term: Improve walking distance/time to onset of claudication pain;Long Term: Improve walking ability and toleration to claudication;Long Term: Improve score of PAD questionnaires                Exercise Goals Re-Evaluation:   Nutrition & Weight - Outcomes:  Pre Biometrics - 07/24/23 0834       Pre Biometrics   Height 4\' 11"  (1.499 m)    Weight 56.2 kg    Waist Circumference 36 inches    Hip Circumference 38 inches    Waist to Hip Ratio 0.95 %    BMI (Calculated) 25    Triceps Skinfold 14 mm    % Body Fat 34.9 %    Grip Strength 22 kg    Flexibility 19 in    Single Leg Stand 30 seconds             Post Biometrics - 09/20/23 0842        Post  Biometrics   Height 4\' 11"  (1.499 m)    Weight 55.8 kg    Waist Circumference 35 inches    Hip Circumference 36 inches    Waist to Hip Ratio 0.97 %    BMI (Calculated) 24.83    Triceps Skinfold 12 mm    % Body Fat 33.7 %    Grip Strength 22 kg    Flexibility 21.5 in    Single Leg Stand 30 seconds            Pt completed SET today, pt made significant progress in program and reports that she is able to tolerate longer distances than when she started. Reviewed HEP and progress made while in program.   Goals reviewed with patient; copy given to patient.

## 2023-09-23 ENCOUNTER — Encounter (HOSPITAL_COMMUNITY): Payer: Medicare Other

## 2023-09-25 ENCOUNTER — Encounter (HOSPITAL_COMMUNITY): Payer: Self-pay

## 2023-09-27 ENCOUNTER — Encounter (HOSPITAL_COMMUNITY): Payer: Self-pay

## 2023-09-30 ENCOUNTER — Encounter (HOSPITAL_COMMUNITY): Payer: Self-pay

## 2023-10-02 ENCOUNTER — Encounter (HOSPITAL_COMMUNITY): Payer: Self-pay

## 2023-10-04 ENCOUNTER — Encounter (HOSPITAL_COMMUNITY): Payer: Self-pay

## 2023-10-07 ENCOUNTER — Encounter (HOSPITAL_COMMUNITY): Payer: Medicare Other

## 2023-10-09 ENCOUNTER — Encounter (HOSPITAL_COMMUNITY): Payer: Medicare Other

## 2023-10-11 ENCOUNTER — Encounter (HOSPITAL_COMMUNITY): Payer: Self-pay

## 2023-10-14 DIAGNOSIS — G4700 Insomnia, unspecified: Secondary | ICD-10-CM | POA: Diagnosis not present

## 2023-10-14 DIAGNOSIS — Z0000 Encounter for general adult medical examination without abnormal findings: Secondary | ICD-10-CM | POA: Diagnosis not present

## 2023-10-14 DIAGNOSIS — R918 Other nonspecific abnormal finding of lung field: Secondary | ICD-10-CM | POA: Diagnosis not present

## 2023-10-14 DIAGNOSIS — E119 Type 2 diabetes mellitus without complications: Secondary | ICD-10-CM | POA: Diagnosis not present

## 2023-10-14 DIAGNOSIS — E782 Mixed hyperlipidemia: Secondary | ICD-10-CM | POA: Diagnosis not present

## 2023-10-14 DIAGNOSIS — L930 Discoid lupus erythematosus: Secondary | ICD-10-CM | POA: Diagnosis not present

## 2023-10-14 DIAGNOSIS — I2510 Atherosclerotic heart disease of native coronary artery without angina pectoris: Secondary | ICD-10-CM | POA: Diagnosis not present

## 2023-10-14 DIAGNOSIS — I10 Essential (primary) hypertension: Secondary | ICD-10-CM | POA: Diagnosis not present

## 2023-10-15 ENCOUNTER — Ambulatory Visit: Payer: Federal, State, Local not specified - PPO | Attending: Physician Assistant

## 2023-10-15 DIAGNOSIS — Z01810 Encounter for preprocedural cardiovascular examination: Secondary | ICD-10-CM

## 2023-10-15 NOTE — Progress Notes (Signed)
 Patient not available at the time of her telephone visit. Rescheduled for 4/2.  Sharlene Dory, PA-C

## 2023-10-15 NOTE — Progress Notes (Signed)
 Pt missed her tele appt today and has been rescheduled to 10/16/23. Pt also states her procedure date has been moved from 10/28/23 to 10/30/23.

## 2023-10-16 ENCOUNTER — Ambulatory Visit: Attending: Cardiovascular Disease | Admitting: Student

## 2023-10-16 DIAGNOSIS — Z01810 Encounter for preprocedural cardiovascular examination: Secondary | ICD-10-CM

## 2023-10-16 NOTE — Progress Notes (Signed)
 Virtual Visit via Telephone Note   Because of [name]'s co-morbid illnesses, she is at least at moderate risk for complications without adequate follow up.  This format is felt to be most appropriate for this patient at this time.  The patient did not have access to video technology/had technical difficulties with video requiring transitioning to audio format only (telephone).  All issues noted in this document were discussed and addressed.  No physical exam could be performed with this format.  Please refer to the patient's chart for her consent to telehealth for [facility]  Evaluation Performed:  Preoperative cardiovascular risk assessment _____________   Date:  10/16/2023   Patient ID:  [name], DOB 1955-10-25, MRN [id number] Patient Location:  Home Provider location:   Office  Primary Care Provider:  Noberto Retort, MD Primary Cardiologist:  Verne Carrow, MD  Chief Complaint / Patient Profile   68 y.o. y/o female with a h/o CAD s/p angioplasty to RCA February 2001, stent to RCA November 2002, stent x 2 to RCA for in-stent restenosis June 2010, BMS to mid LCx October 2013; PAD, hypertension, hyperlipidemia, lupus, former tobacco abuse who is pending colonoscopy by Dr. Marca Ancona on 10/30/2023 and presents today for telephonic preoperative cardiovascular risk assessment.  History of Present Illness    [name] is a 68 y.o. female who presents via Web designer for a telehealth visit today.  Pt was last seen in cardiology clinic on 05/20/2023 by Dr. Clifton James.  At that time [name] was stable from a cardiac standpoint.  The patient is now pending procedure as outlined above. Since her last visit, she is doing well. Patient denies shortness of breath, dyspnea on exertion, lower extremity edema, orthopnea or PND. No chest pain, pressure, or tightness. No palpitations.  She is active going to the [employer] twice a week for  exercise class. She also walks some during the week and on Sundays.   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"   Depression    Diabetes mellitus without complication (HCC)    type 2 dx july 2018   H/O: substance abuse (HCC)    Hyperlipidemia    Mass on back    Myocardial infarction [facility])    "not sure; nobody ever tells me" (04/23/2012)   Noncompliance    PVD (peripheral vascular disease) (HCC)    MILD   Past Surgical History:  Procedure Laterality Date   ABDOMINAL HYSTERECTOMY  1999   ovaries removed weeks later   ANTERIOR CRUCIATE LIGAMENT REPAIR  lasy February 02 2015   "have had 15 surgeries on my right ACL"   BREAST SURGERY     "removed knot from one side"   CORONARY ANGIOPLASTY WITH STENT PLACEMENT  2002; 2010; 04/23/2012   "1 + 2 + 1; total of 4"   EXCISIONAL HEMORRHOIDECTOMY  yrs ago   HERNIA REPAIR     points to stomach   LEFT HEART CATHETERIZATION WITH CORONARY ANGIOGRAM N/A 04/23/2012   Procedure: LEFT HEART CATHETERIZATION WITH CORONARY  ANGIOGRAM;  Surgeon: Kathleene Hazel, MD;  Location: [facility] CATH LAB;  Service: Cardiovascular;  Laterality: N/A;   MASS EXCISION N/A 08/29/2017   Procedure: EXCISIONAL BIOPSY OF BACK SOFT TISSUE MASS;  Surgeon: Gaynelle Adu, MD;  Location: [facility] ORS;  Service: General;  Laterality: N/A;   TONSILLECTOMY  early teens    Allergies  Allergies  Allergen Reactions   Chantix [Varenicline] Other (See Comments)    Home Medications    Prior to Admission medications   Medication Sig Start Date End Date Taking? Authorizing Provider  acetaminophen (TYLENOL) 500 MG tablet Take 1,000 mg by  mouth every 6 (six) hours as needed for mild pain. For pain    [provider]  aspirin 81 MG tablet Take 81 mg by mouth at bedtime.    [provider]  atorvastatin (LIPITOR) 20 MG tablet TAKE 1 TABLET BY MOUTH EVERY DAY 02/26/23   Kathleene Hazel, MD  betamethasone dipropionate 0.05 % lotion Apply topically.    [provider]  Cholecalciferol (HM VITAMIN D3) 100 MCG (4000 UT) CAPS Take by mouth daily.    [provider]  cyclobenzaprine (FLEXERIL) 10 MG tablet Take 10 mg by mouth 3 (three) times daily as needed.    [provider]  ezetimibe (ZETIA) 10 MG tablet TAKE 1 TABLET BY MOUTH EVERY DAY 08/28/23   Kathleene Hazel, MD  hydroxychloroquine (PLAQUENIL) 200 MG tablet Take 200 mg by mouth at bedtime.    [provider]  ibuprofen (ADVIL,MOTRIN) 200 MG tablet Take 400 mg by mouth daily as needed for moderate pain.    [provider]  LORazepam (ATIVAN) 0.5 MG tablet as needed.  06/26/18   [provider]  metFORMIN (GLUCOPHAGE-XR) 500 MG 24 hr tablet Take 500 mg by mouth 3 (three) times daily.    [provider]  metoprolol succinate (TOPROL-XL) 25 MG 24 hr tablet TAKE 1 TABLET (25 MG TOTAL) BY MOUTH DAILY. 06/05/23   Kathleene Hazel, MD  Multiple Vitamin (MULTIVITAMIN) tablet Take 1 tablet by mouth daily.    [provider]  nitroGLYCERIN (NITROSTAT) 0.4 MG SL tablet PLACE 1 TABLET (0.4 MG TOTAL) UNDER THE TONGUE EVERY 5 (FIVE) MINUTES AS NEEDED. FOR CHEST PAIN 06/12/22   Swinyer, Zachary George, NP  omeprazole (PRILOSEC) 40 MG capsule Take 40 mg by mouth daily as needed.    [provider]  triamcinolone ointment (KENALOG) 0.1 % Apply 1 application topically daily as needed (lupus flares). 06/24/18   [provider]  zolpidem (AMBIEN) 10 MG tablet Take 10 mg by mouth at bedtime as needed for sleep.    [provider]    Physical Exam    Vital Signs:  Krista  V [name] does not have vital signs available for review today.  Given telephonic nature of communication, physical exam is limited. AAOx3. NAD. Normal affect.  Speech and respirations are unlabored.   Assessment & Plan    Primary Cardiologist: Verne Carrow, MD  Preoperative cardiovascular risk assessment.  Colonoscopy by Dr. Marca Ancona on 10/30/2023  Chart reviewed as part of pre-operative protocol coverage. According to the RCRI, patient has a 0.9% risk of MACE. Patient reports activity equivalent to 4.0 METS (exercise class at the senior center 2 times a week, walking for exercise).   Given past medical history and time since last visit, based on ACC/AHA guidelines, [name] would be at acceptable risk for the planned procedure without further cardiovascular testing.   Patient was  advised that if she develops new symptoms prior to surgery to contact our office to arrange a follow-up appointment.  she verbalized understanding.  Per Dr. Clifton James, pt may hold Aspirin for 5-7 days prior to procedure. Please resume Aspirin as soon as possible postprocedure, at the discretion of the surgeon.   I will route this recommendation to the requesting party via Epic fax function.  Please call with questions.  Time:   Today, I have spent 5 minutes with the patient with telehealth technology discussing medical history, symptoms, and management plan.     Carlos Levering, NP  10/16/2023, 8:31 AM

## 2023-10-25 DIAGNOSIS — M25512 Pain in left shoulder: Secondary | ICD-10-CM | POA: Diagnosis not present

## 2023-10-30 DIAGNOSIS — Z860101 Personal history of adenomatous and serrated colon polyps: Secondary | ICD-10-CM | POA: Diagnosis not present

## 2023-10-30 DIAGNOSIS — K5730 Diverticulosis of large intestine without perforation or abscess without bleeding: Secondary | ICD-10-CM | POA: Diagnosis not present

## 2023-10-30 DIAGNOSIS — Z09 Encounter for follow-up examination after completed treatment for conditions other than malignant neoplasm: Secondary | ICD-10-CM | POA: Diagnosis not present

## 2023-10-30 DIAGNOSIS — D123 Benign neoplasm of transverse colon: Secondary | ICD-10-CM | POA: Diagnosis not present

## 2023-10-30 DIAGNOSIS — D122 Benign neoplasm of ascending colon: Secondary | ICD-10-CM | POA: Diagnosis not present

## 2023-10-31 DIAGNOSIS — D123 Benign neoplasm of transverse colon: Secondary | ICD-10-CM | POA: Diagnosis not present

## 2023-10-31 DIAGNOSIS — D122 Benign neoplasm of ascending colon: Secondary | ICD-10-CM | POA: Diagnosis not present

## 2023-11-04 DIAGNOSIS — M25512 Pain in left shoulder: Secondary | ICD-10-CM | POA: Diagnosis not present

## 2023-11-05 ENCOUNTER — Telehealth: Payer: Self-pay | Admitting: Cardiovascular Disease

## 2023-11-05 NOTE — Telephone Encounter (Signed)
 Patient identification verified by 2 forms. Hilton Lucky, RN    Called and spoke to patient  Patient states:   -she is having circulation issue with her toes   -she had to keep changing shoes sizes due to tightness   -has some numbness in toes unsure about tingling   -toes feel "dead"   -symptoms on going since October  Patient denies:   -discoloration in toes  -difficulty walking  Patient scheduled for OV 5/2 at 3:35pm  Patient agrees, no further questions at this time

## 2023-11-05 NOTE — Telephone Encounter (Signed)
 Pt states her toes on right foot feel like they are not getting any circulation. She states they feel like they "have a string around them". She states she also has to keep changing her shoes. She asked what she should do. Please advise.

## 2023-11-15 ENCOUNTER — Encounter: Payer: Self-pay | Admitting: Physician Assistant

## 2023-11-15 ENCOUNTER — Ambulatory Visit: Attending: Physician Assistant | Admitting: Physician Assistant

## 2023-11-15 VITALS — BP 100/72 | Ht 59.0 in | Wt 122.8 lb

## 2023-11-15 DIAGNOSIS — E785 Hyperlipidemia, unspecified: Secondary | ICD-10-CM

## 2023-11-15 DIAGNOSIS — I739 Peripheral vascular disease, unspecified: Secondary | ICD-10-CM | POA: Diagnosis not present

## 2023-11-15 DIAGNOSIS — I2510 Atherosclerotic heart disease of native coronary artery without angina pectoris: Secondary | ICD-10-CM

## 2023-11-15 DIAGNOSIS — M79671 Pain in right foot: Secondary | ICD-10-CM

## 2023-11-15 NOTE — Patient Instructions (Signed)
 Medication Instructions:  NO CHANGES *If you need a refill on your cardiac medications before your next appointment, please call your pharmacy*  Lab Work: NO LABS If you have labs (blood work) drawn today and your tests are completely normal, you will receive your results only by: MyChart Message (if you have MyChart) OR A paper copy in the mail If you have any lab test that is abnormal or we need to change your treatment, we will call you to review the results.  Testing/Procedures:[address] Your physician has requested that you have an ankle brachial index (ABI). During this test an ultrasound and blood pressure cuff are used to evaluate the arteries that supply the arms and legs with blood. Allow thirty minutes for this exam. There are no restrictions or special instructions.  Please note: We ask at that you not bring children with you during ultrasound (echo/ vascular) testing. Due to room size and safety concerns, children are not allowed in the ultrasound rooms during exams. Our front office staff cannot provide observation of children in our lobby area while testing is being conducted. An adult accompanying a patient to their appointment will only be allowed in the ultrasound room at the discretion of the ultrasound technician under special circumstances. We apologize for any inconvenience.   Your physician has requested that you have a lower extremity arterial duplex. This test is an ultrasound of the arteries in the legs. It looks at arterial blood flow in the legs. Allow one hour for Lower Arterial scans. There are no restrictions or special instructions.  Please note: We ask at that you not bring children with you during ultrasound (echo/ vascular) testing. Due to room size and safety concerns, children are not allowed in the ultrasound rooms during exams. Our front office staff cannot provide observation of children in our lobby area while testing is being conducted. An adult  accompanying a patient to their appointment will only be allowed in the ultrasound room at the discretion of the ultrasound technician under special circumstances. We apologize for any inconvenience.   Follow-Up: At [facility], you and your health needs are our priority.  As part of our continuing mission to provide you with exceptional heart care, our providers are all part of one team.  This team includes your primary Cardiologist (physician) and Advanced Practice Providers or APPs (Physician Assistants and Nurse Practitioners) who all work together to provide you with the care you need, when you need it.  Your next appointment:   KEEP RECALL TO FOLLOW UP OCTOBER 2025  Provider:   Antoinette Batman, MD  AND NOVEMBER 2025 Antionette Kirks, MD

## 2023-11-15 NOTE — Progress Notes (Unsigned)
 Cardiology Office Note:  .   Date:  11/15/2023  ID:  [name], DOB 1955-09-19, MRN [id number] PCP: Krista Congo, MD  [facility] Providers Cardiologist:  Krista Batman, MD { Click to update primary MD,subspecialty MD or APP then REFRESH:1}   History of Present Illness: .   [name] is a 68 y.o. female with past medical history of CAD, former tobacco abuse, cutaneous lupus, anxiety/depression, hyperlipidemia, PAD and DM 2.  She had PCI of RCA in 2002.  Cardiac catheterization in 2010 showed her RCA was still subtotally occluded distally.  She then had 2 overlapping drug-eluting stents placed in the distal RCA.  She had a total of 3 stents in the RCA.  Stress test in 2013 in the setting of chest pain showed possible lateral wall ischemia.  Cardiac catheterization in October 2013 showed severe stenosis in the OM treated with bare-metal stent.  Stress test in August 2016 showed no ischemia.  Echocardiogram in December 2021 showed EF 60 to 65%, no significant valve issue.  Myoview  in April 2022 showed no evidence of ischemia.  Her PAD is being followed by Dr. Alvenia Aus.  She was initially seen in 2020 for mild bilateral calf claudication.  Noninvasive study showed right ABI 0.88, left ABI 0.83.  Duplex showed a right SFA disease with two-vessel runoff below the knee.  On the left, there was borderline significant left SFA disease with three-vessel runoff below the knee.  Given the fact that her symptom was not lifestyle limiting, therefore it was recommended a walking program and treatment of risk factors.  Repeat Doppler study in May 2024 showed drop of ABI on the right side to 0.78 and is stable on the left side at 0.89.  Duplex of the right lower extremity showed moderate common femoral and the distal SFA disease.  Dr. Alvenia Aus recommended a structured exercise therapy.  She underwent structured exercise therapy and has shown improvement [place] to claudication symptom.  The  amount of distance she can ambulate before claudication sat in has essentially doubled.  She cannot walk up to a mile without claudication symptom.  Since October, she has been noticing some pain in the right big toe and 3rd through 5th toe.  On physical exam, there is no discoloration or open ulcers.  Dorsalis pedis pulse and posterior tibial pulse on the right side is weaker compared to the left side.  Left side had good dorsalis pedis pulse.  She sometimes describe her big toe curl up and causing significant discomfort.  The symptom does not sound like claudication.  I recommended a ABI/TBI and lower extremity arterial Doppler.  If ultrasound study is stable, she can follow-up with Dr. Alvenia Aus near the end of this year based on recall.  ROS: ***  Studies Reviewed: .        *** Risk Assessment/Calculations:   {Does this patient have ATRIAL FIBRILLATION?:[id number]}         Physical Exam:   VS:  BP 100/72   Ht 4\' 11"  (1.499 m)   Wt 122 lb 12.8 oz (55.7 kg)   SpO2 99%   BMI 24.80 kg/m    Wt Readings from Last 3 Encounters:  11/15/23 122 lb 12.8 oz (55.7 kg)  09/20/23 123 lb 0.3 oz (55.8 kg)  07/24/23 123 lb 13.3 oz (56.2 kg)    GEN: Well nourished, well developed in no acute distress NECK: No JVD; No carotid bruits CARDIAC: ***RRR, no murmurs, rubs, gallops RESPIRATORY:  Clear  to auscultation without rales, wheezing or rhonchi  ABDOMEN: Soft, non-tender, non-distended EXTREMITIES:  No edema; No deformity   ASSESSMENT AND PLAN: .   ***    {Are you ordering a CV Procedure (e.g. stress test, cath, DCCV, TEE, etc)?   Press F2        :[id number]}  Dispo: ***  Signed, Ervin Heath, PA

## 2023-11-20 DIAGNOSIS — D126 Benign neoplasm of colon, unspecified: Secondary | ICD-10-CM | POA: Diagnosis not present

## 2023-11-20 DIAGNOSIS — I2510 Atherosclerotic heart disease of native coronary artery without angina pectoris: Secondary | ICD-10-CM | POA: Diagnosis not present

## 2023-11-20 DIAGNOSIS — D123 Benign neoplasm of transverse colon: Secondary | ICD-10-CM | POA: Diagnosis not present

## 2023-11-20 DIAGNOSIS — Z87891 Personal history of nicotine dependence: Secondary | ICD-10-CM | POA: Diagnosis not present

## 2023-11-20 DIAGNOSIS — I10 Essential (primary) hypertension: Secondary | ICD-10-CM | POA: Diagnosis not present

## 2023-11-20 DIAGNOSIS — Z955 Presence of coronary angioplasty implant and graft: Secondary | ICD-10-CM | POA: Diagnosis not present

## 2023-11-20 DIAGNOSIS — Z79899 Other long term (current) drug therapy: Secondary | ICD-10-CM | POA: Diagnosis not present

## 2023-11-20 DIAGNOSIS — Z7982 Long term (current) use of aspirin: Secondary | ICD-10-CM | POA: Diagnosis not present

## 2023-11-20 DIAGNOSIS — E1151 Type 2 diabetes mellitus with diabetic peripheral angiopathy without gangrene: Secondary | ICD-10-CM | POA: Diagnosis not present

## 2023-11-20 DIAGNOSIS — M329 Systemic lupus erythematosus, unspecified: Secondary | ICD-10-CM | POA: Diagnosis not present

## 2023-11-20 DIAGNOSIS — Z7984 Long term (current) use of oral hypoglycemic drugs: Secondary | ICD-10-CM | POA: Diagnosis not present

## 2023-11-20 DIAGNOSIS — D122 Benign neoplasm of ascending colon: Secondary | ICD-10-CM | POA: Diagnosis not present

## 2023-11-20 DIAGNOSIS — K635 Polyp of colon: Secondary | ICD-10-CM | POA: Diagnosis not present

## 2023-12-02 DIAGNOSIS — L930 Discoid lupus erythematosus: Secondary | ICD-10-CM | POA: Diagnosis not present

## 2023-12-02 DIAGNOSIS — Z79899 Other long term (current) drug therapy: Secondary | ICD-10-CM | POA: Diagnosis not present

## 2023-12-02 DIAGNOSIS — M1991 Primary osteoarthritis, unspecified site: Secondary | ICD-10-CM | POA: Diagnosis not present

## 2023-12-13 ENCOUNTER — Ambulatory Visit (HOSPITAL_COMMUNITY)
Admission: RE | Admit: 2023-12-13 | Discharge: 2023-12-13 | Disposition: A | Discharge status: Home / Self Care | Source: Ambulatory Visit | Attending: Physician Assistant | Admitting: Physician Assistant

## 2023-12-13 DIAGNOSIS — M79671 Pain in right foot: Secondary | ICD-10-CM

## 2023-12-14 ENCOUNTER — Ambulatory Visit: Payer: Self-pay | Admitting: Physician Assistant

## 2023-12-16 LAB — VAS US ABI WITH/WO TBI
Left ABI: 1.02
Right ABI: 0.79

## 2023-12-16 NOTE — Progress Notes (Signed)
 Lower extremity arterial flow unchanged when compare to 2024 study.

## 2024-01-22 DIAGNOSIS — Z1231 Encounter for screening mammogram for malignant neoplasm of breast: Secondary | ICD-10-CM | POA: Diagnosis not present

## 2024-01-24 ENCOUNTER — Telehealth: Payer: Self-pay | Admitting: Cardiovascular Disease

## 2024-01-24 DIAGNOSIS — L930 Discoid lupus erythematosus: Secondary | ICD-10-CM | POA: Diagnosis not present

## 2024-01-24 DIAGNOSIS — I2510 Atherosclerotic heart disease of native coronary artery without angina pectoris: Secondary | ICD-10-CM

## 2024-01-24 MED ORDER — ATORVASTATIN CALCIUM 20 MG PO TABS
20.0000 mg | ORAL_TABLET | Freq: Every day | ORAL | 3 refills | Status: AC
Start: 2024-01-24 — End: ?

## 2024-01-24 NOTE — Telephone Encounter (Signed)
*  STAT* If patient is at the pharmacy, call can be transferred to refill team.   1. Which medications need to be refilled? (please list name of each medication and dose if known)   atorvastatin  (LIPITOR) 20 MG tablet    4. Which pharmacy/location (including street and city if local pharmacy) is medication to be sent to? [id number] - [place], [place] - [address]     5. Do they need a 30 day or 90 day supply? 90

## 2024-01-24 NOTE — Telephone Encounter (Signed)
 RX sent to requested Pharmacy

## 2024-02-07 DIAGNOSIS — M25512 Pain in left shoulder: Secondary | ICD-10-CM | POA: Diagnosis not present

## 2024-02-21 DIAGNOSIS — M25512 Pain in left shoulder: Secondary | ICD-10-CM | POA: Diagnosis not present

## 2024-02-26 DIAGNOSIS — M329 Systemic lupus erythematosus, unspecified: Secondary | ICD-10-CM | POA: Diagnosis not present

## 2024-02-26 DIAGNOSIS — H04123 Dry eye syndrome of bilateral lacrimal glands: Secondary | ICD-10-CM | POA: Diagnosis not present

## 2024-02-26 DIAGNOSIS — Z79899 Other long term (current) drug therapy: Secondary | ICD-10-CM | POA: Diagnosis not present

## 2024-02-26 DIAGNOSIS — H2513 Age-related nuclear cataract, bilateral: Secondary | ICD-10-CM | POA: Diagnosis not present

## 2024-02-26 DIAGNOSIS — H40013 Open angle with borderline findings, low risk, bilateral: Secondary | ICD-10-CM | POA: Diagnosis not present

## 2024-02-26 DIAGNOSIS — E119 Type 2 diabetes mellitus without complications: Secondary | ICD-10-CM | POA: Diagnosis not present

## 2024-03-02 DIAGNOSIS — M25512 Pain in left shoulder: Secondary | ICD-10-CM | POA: Diagnosis not present

## 2024-03-18 ENCOUNTER — Telehealth: Payer: Self-pay | Admitting: *Deleted

## 2024-03-18 DIAGNOSIS — M87011 Idiopathic aseptic necrosis of right shoulder: Secondary | ICD-10-CM | POA: Diagnosis not present

## 2024-03-18 DIAGNOSIS — S46012A Strain of muscle(s) and tendon(s) of the rotator cuff of left shoulder, initial encounter: Secondary | ICD-10-CM | POA: Diagnosis not present

## 2024-03-18 NOTE — Telephone Encounter (Signed)
 S/w the pt and she tells me that she is not sure she is going to have the surgery right now. She tells me that she wants to think about all of this before saying yes to the surgery.  Pt and I left the conversation with she is going to take some time to think about the surgery. Pt will call our office back and schedule tele visit for preop clearance when she is ready to schedule her surgery.   Until then we will wait for the pt to call our office back.   I will update the surgeon office as well.

## 2024-03-18 NOTE — Telephone Encounter (Signed)
   Name: [name]  DOB: 04/25/1956  MRN: [id number]  Primary Cardiologist: Lonni Cash, MD   Preoperative team, please contact this patient and set up a phone call appointment for further preoperative risk assessment. Please obtain consent and complete medication review. Thank you for your help.  I confirm that guidance regarding antiplatelet has been requested- patient has a history of overlapping stents. Dr. Cash has been asked to weigh in   I also confirmed the patient resides in the state of [place] . As per [facility] Medical Board telemedicine laws, the patient must reside in the state in which the provider is licensed.   Rollo FABIENE Louder, PA-C 03/18/2024, 4:21 PM [facility]

## 2024-03-18 NOTE — Telephone Encounter (Signed)
   Pre-operative Risk Assessment    Patient Name: [name]  DOB: Nov 06, 1955 MRN: [id number]   Date of last office visit: 11/15/23 HAO MENG, PAC Date of next office visit: NONE   Request for Surgical Clearance    Procedure:  LEFT REVERSE TOTAL SHOULDER ARTHROPLASTY  Date of Surgery:  Clearance TBD                                Surgeon:  DR. FONDA PAM Surgeon's Group or Practice Name:  [facility] Phone number:  [phone] EXT [address] Fax number:  [phone]   Type of Clearance Requested:   - Medical  - Pharmacy:  Hold Aspirin      Type of Anesthesia:  General  WITH INTERSCALENE BLOCK   Additional requests/questions:    Bonney Niels Jest   03/18/2024, 3:57 PM

## 2024-03-19 NOTE — Telephone Encounter (Signed)
 Patient is unsure if she will proceed with surgery.   Will remove from pre-op pool.   [name]. [name], DNP, NP-C  03/19/2024, 11:49 AM [facility] [address] Office [phone] Fax [phone]

## 2024-04-17 ENCOUNTER — Other Ambulatory Visit: Payer: Self-pay | Admitting: Family Medicine

## 2024-04-17 DIAGNOSIS — L930 Discoid lupus erythematosus: Secondary | ICD-10-CM | POA: Diagnosis not present

## 2024-04-17 DIAGNOSIS — I10 Essential (primary) hypertension: Secondary | ICD-10-CM | POA: Diagnosis not present

## 2024-04-17 DIAGNOSIS — R918 Other nonspecific abnormal finding of lung field: Secondary | ICD-10-CM

## 2024-04-17 DIAGNOSIS — G4700 Insomnia, unspecified: Secondary | ICD-10-CM | POA: Diagnosis not present

## 2024-04-17 DIAGNOSIS — I2510 Atherosclerotic heart disease of native coronary artery without angina pectoris: Secondary | ICD-10-CM | POA: Diagnosis not present

## 2024-04-17 DIAGNOSIS — E119 Type 2 diabetes mellitus without complications: Secondary | ICD-10-CM | POA: Diagnosis not present

## 2024-04-17 DIAGNOSIS — E782 Mixed hyperlipidemia: Secondary | ICD-10-CM | POA: Diagnosis not present

## 2024-04-21 ENCOUNTER — Ambulatory Visit
Admission: RE | Admit: 2024-04-21 | Discharge: 2024-04-21 | Disposition: A | Discharge status: Home / Self Care | Source: Ambulatory Visit | Attending: Family Medicine | Admitting: Family Medicine

## 2024-04-21 DIAGNOSIS — R918 Other nonspecific abnormal finding of lung field: Secondary | ICD-10-CM

## 2024-04-21 DIAGNOSIS — R911 Solitary pulmonary nodule: Secondary | ICD-10-CM | POA: Diagnosis not present

## 2024-05-05 ENCOUNTER — Telehealth: Payer: Self-pay

## 2024-05-05 ENCOUNTER — Ambulatory Visit

## 2024-05-05 VITALS — BP 129/84 | HR 68 | Temp 98.0°F | Ht 59.0 in | Wt 126.2 lb

## 2024-05-05 DIAGNOSIS — R911 Solitary pulmonary nodule: Secondary | ICD-10-CM

## 2024-05-05 DIAGNOSIS — R918 Other nonspecific abnormal finding of lung field: Secondary | ICD-10-CM | POA: Insufficient documentation

## 2024-05-05 NOTE — H&P (View-Only) (Signed)
 Subjective:   PATIENT ID: [name] GENDER: female DOB: 02-25-1956, MRN: [id number]   HPI Discussed the use of AI scribe software for clinical note transcription with the patient, who gave verbal consent to proceed.  History of Present Illness [name] is a 68 year old female with coronary artery disease who presents for evaluation of pulmonary nodules.  Pulmonary nodules were first identified in 2022 during a CT scan performed for shortness of breath and concern for pulmonary embolism. The initial scan revealed a 9 mm ground glass opacity in the right lower lobe. Subsequent CT scans in 2023 and 2024 demonstrated slow growth of the nodules, with the right lower lobe nodule increasing to 1.1 cm and a new 5 mm nodule appearing in the right upper lobe. By 2025, both nodules had grown to approximately 1.4 cm. The nodules are semi-solid.  Her past medical history includes lupus, which is limited to skin involvement without systemic manifestations, and coronary artery disease with four stents placed, the last in 2013. She also has avascular necrosis in the right shoulder and requires a replacement for in the left shoulder. She has undergone 15 surgeries, including two ACL repairs on her knee.  She quit smoking cigarettes in 2013 after smoking since age 64 and currently smokes marijuana. She is diabetic and takes an 81 mg aspirin  daily. She does not take Plavix .  No pain or nausea. No history of sleep apnea.     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   Depression    Diabetes mellitus without complication (HCC)    type 2 dx july 2018   H/O: substance  abuse (HCC)    Hyperlipidemia    Mass on back    Myocardial infarction [facility])    not sure; nobody ever tells me (04/23/2012)   Noncompliance    PVD (peripheral vascular disease)    MILD     Family History  Problem Relation Age of Onset   Hypertension Mother    Lung cancer Mother    Heart disease Father    Hypertension Father    Stroke Father    Heart failure Father    Colon cancer Maternal Grandmother      Social History   Socioeconomic History   Marital status: Single    Spouse name: Not on file   Number of children: Not on file   Years of education: Not on file   Highest education level: Not on file  Occupational History   Not on file  Tobacco Use   Smoking status: Former    Current packs/day: 0.00    Average packs/day: 1.5 packs/day for 38.0 years (57.0 ttl pk-yrs)    Types: Cigarettes    Start date: 04/20/1974    Quit date: 04/20/2012    Years since quitting: 12.0   Smokeless tobacco: Never  Vaping Use   Vaping status: Never Used  Substance and Sexual Activity   Alcohol use: Yes    Alcohol/week: 14.0 standard drinks of alcohol    Types: 14 Cans of beer per week    Comment:  beer 2 x week    Drug use: Yes    Types: Marijuana    Comment: smoked marijuana 09-04-16   Sexual activity: Never  Other Topics Concern   Not on file  Social History Narrative   Not on file   Social Drivers of Health   Financial Resource Strain: Not on file  Food Insecurity: Not on file  Transportation Needs: Not on file  Physical Activity: Not on file  Stress: Not on file  Social Connections: Not on file  Intimate Partner Violence: Not on file     Allergies  Allergen Reactions   Chantix [Varenicline] Other (See Comments)     Outpatient Medications Prior to Visit  Medication Sig Dispense Refill   acetaminophen  (TYLENOL ) 500 MG tablet Take 1,000 mg by mouth every 6 (six) hours as needed for mild pain. For pain     aspirin  81 MG tablet Take 81 mg by mouth at bedtime.      atorvastatin  (LIPITOR) 20 MG tablet Take 1 tablet (20 mg total) by mouth daily. 90 tablet 3   betamethasone  dipropionate 0.05 % lotion Apply 1 Application topically as needed.     Cholecalciferol (HM VITAMIN D3) 100 MCG (4000 UT) CAPS Take by mouth daily.     cyclobenzaprine (FLEXERIL) 10 MG tablet Take 10 mg by mouth 3 (three) times daily as needed.     ezetimibe  (ZETIA ) 10 MG tablet TAKE 1 TABLET BY MOUTH EVERY DAY 90 tablet 3   hydroxychloroquine  (PLAQUENIL ) 200 MG tablet Take 200 mg by mouth at bedtime.     ibuprofen (ADVIL) 400 MG tablet Take 400 mg by mouth daily as needed for moderate pain (pain score 4-6).     LORazepam (ATIVAN) 0.5 MG tablet as needed.      metFORMIN (GLUCOPHAGE-XR) 500 MG 24 hr tablet Take 500 mg by mouth 3 (three) times daily. (Patient taking differently: Take 500 mg by mouth in the morning, at noon, in the evening, and at bedtime.)     metoprolol  succinate (TOPROL -XL) 25 MG 24 hr tablet TAKE 1 TABLET (25 MG TOTAL) BY MOUTH DAILY. 90 tablet 3   Multiple Vitamin (MULTIVITAMIN) tablet Take 1 tablet by mouth daily.     nitroGLYCERIN  (NITROSTAT ) 0.4 MG SL tablet PLACE 1 TABLET (0.4 MG TOTAL) UNDER THE TONGUE EVERY 5 (FIVE) MINUTES AS NEEDED. FOR CHEST PAIN 25 tablet 3   omeprazole (PRILOSEC) 40 MG capsule Take 40 mg by mouth daily as needed.     triamcinolone  ointment (KENALOG) 0.1 % Apply 1 application topically daily as needed (lupus flares).     zolpidem  (AMBIEN ) 10 MG tablet Take 10 mg by mouth at bedtime as needed for sleep.     No facility-administered medications prior to visit.    ROS Reviewed all systems and reported negative except as above     Objective:   Vitals:   05/05/24 0957  BP: 129/84  Pulse: 68  Temp: 98 F (36.7 C)  TempSrc: Oral  SpO2: 99%  Weight: 126 lb 3.2 oz (57.2 kg)  Height: 4' 11 (1.499 m)    Physical Exam Constitutional:      Appearance: Normal appearance.  HENT:     Head: Normocephalic.     Right Ear: Tympanic membrane  normal.     Nose: Nose normal.     Mouth/Throat:     Mouth: Mucous membranes are moist.  Cardiovascular:     Rate and Rhythm: Normal rate and regular rhythm.  Pulmonary:  Effort: Pulmonary effort is normal.  Abdominal:     General: Abdomen is flat.  Skin:    General: Skin is warm.     Capillary Refill: Capillary refill takes less than 2 seconds.  Neurological:     General: No focal deficit present.     Mental Status: She is alert.       CBC    Component Value Date/Time   WBC 5.6 03/10/2021 1837   RBC 4.43 03/10/2021 1837   HGB 12.7 03/10/2021 1837   HGB 13.3 03/26/2012 1004   HCT 40.0 03/10/2021 1837   HCT 40.8 03/26/2012 1004   PLT 359 03/10/2021 1837   PLT 289 03/26/2012 1004   MCV 90.3 03/10/2021 1837   MCV 91.9 03/26/2012 1004   MCH 28.7 03/10/2021 1837   MCHC 31.8 03/10/2021 1837   RDW 14.4 03/10/2021 1837   RDW 15.3 (H) 03/26/2012 1004   LYMPHSABS 3.0 03/10/2021 1837   LYMPHSABS 2.6 03/26/2012 1004   MONOABS 0.5 03/10/2021 1837   MONOABS 0.3 03/26/2012 1004   EOSABS 0.0 03/10/2021 1837   EOSABS 0.0 03/26/2012 1004   BASOSABS 0.0 03/10/2021 1837   BASOSABS 0.0 03/26/2012 1004         Assessment & Plan:    Assessment & Plan Pulmonary nodules, right upper and lower lobes, suspicious for slow-growing malignancy Nodules in right upper and lower lobes, semi-solid, 1.4 cm, slow growth over three years, suggestive of non-aggressive malignancy. PET scan likely ineffective. - Schedule bronchoscopy for biopsy on November 20th. - Order pre-procedure CT scan for bronchoscopy. - Discussed potential post-procedure symptoms such as sore throat, cough, and minor bleeding. - Provided anticipatory guidance on potential complications and post-procedure care, including pneumothorax, pneumonia, and bleeding. - Coordinate with multidisciplinary team for post-biopsy treatment planning if cancer is confirmed.        Zola Herter, MD [facility] Office: [phone]

## 2024-05-05 NOTE — Telephone Encounter (Signed)
 Please schedule the following:  Provider performing procedure:Dr Shalamar Crays Diagnosis: Two R lung nodules Which side if for nodule / mass? Right side Procedure: Robotic Navigational bronch + EBUS  Has patient been spoken to by Provider and given informed consent? Yes Anesthesia: General Do you need Fluro? Yes Duration of procedure: 90 minutes Date: 06/04/24  Time: AM Location: [facility] Does patient have OSA? No DM? Yes Or Latex allergy ? No Medication Restriction/ Anticoagulate/Antiplatelet: Continue home aspirin  Pre-op Labs Ordered:determined by Anesthesia Imaging request: CT super D ordered  (If, SuperDimension CT Chest, please have STAT courier sent to ENDO)

## 2024-05-05 NOTE — Progress Notes (Signed)
 Subjective:   PATIENT ID: [name] Bud GENDER: female DOB: 02-25-1956, MRN: [id number]   HPI Discussed the use of AI scribe software for clinical note transcription with the patient, who gave verbal consent to proceed.  History of Present Illness [name] is a 68 year old female with coronary artery disease who presents for evaluation of pulmonary nodules.  Pulmonary nodules were first identified in 2022 during a CT scan performed for shortness of breath and concern for pulmonary embolism. The initial scan revealed a 9 mm ground glass opacity in the right lower lobe. Subsequent CT scans in 2023 and 2024 demonstrated slow growth of the nodules, with the right lower lobe nodule increasing to 1.1 cm and a new 5 mm nodule appearing in the right upper lobe. By 2025, both nodules had grown to approximately 1.4 cm. The nodules are semi-solid.  Her past medical history includes lupus, which is limited to skin involvement without systemic manifestations, and coronary artery disease with four stents placed, the last in 2013. She also has avascular necrosis in the right shoulder and requires a replacement for in the left shoulder. She has undergone 15 surgeries, including two ACL repairs on her knee.  She quit smoking cigarettes in 2013 after smoking since age 64 and currently smokes marijuana. She is diabetic and takes an 81 mg aspirin  daily. She does not take Plavix .  No pain or nausea. No history of sleep apnea.     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   Depression    Diabetes mellitus without complication (HCC)    type 2 dx july 2018   H/O: substance  abuse (HCC)    Hyperlipidemia    Mass on back    Myocardial infarction [facility])    not sure; nobody ever tells me (04/23/2012)   Noncompliance    PVD (peripheral vascular disease)    MILD     Family History  Problem Relation Age of Onset   Hypertension Mother    Lung cancer Mother    Heart disease Father    Hypertension Father    Stroke Father    Heart failure Father    Colon cancer Maternal Grandmother      Social History   Socioeconomic History   Marital status: Single    Spouse name: Not on file   Number of children: Not on file   Years of education: Not on file   Highest education level: Not on file  Occupational History   Not on file  Tobacco Use   Smoking status: Former    Current packs/day: 0.00    Average packs/day: 1.5 packs/day for 38.0 years (57.0 ttl pk-yrs)    Types: Cigarettes    Start date: 04/20/1974    Quit date: 04/20/2012    Years since quitting: 12.0   Smokeless tobacco: Never  Vaping Use   Vaping status: Never Used  Substance and Sexual Activity   Alcohol use: Yes    Alcohol/week: 14.0 standard drinks of alcohol    Types: 14 Cans of beer per week    Comment:  beer 2 x week    Drug use: Yes    Types: Marijuana    Comment: smoked marijuana 09-04-16   Sexual activity: Never  Other Topics Concern   Not on file  Social History Narrative   Not on file   Social Drivers of Health   Financial Resource Strain: Not on file  Food Insecurity: Not on file  Transportation Needs: Not on file  Physical Activity: Not on file  Stress: Not on file  Social Connections: Not on file  Intimate Partner Violence: Not on file     Allergies  Allergen Reactions   Chantix [Varenicline] Other (See Comments)     Outpatient Medications Prior to Visit  Medication Sig Dispense Refill   acetaminophen  (TYLENOL ) 500 MG tablet Take 1,000 mg by mouth every 6 (six) hours as needed for mild pain. For pain     aspirin  81 MG tablet Take 81 mg by mouth at bedtime.      atorvastatin  (LIPITOR) 20 MG tablet Take 1 tablet (20 mg total) by mouth daily. 90 tablet 3   betamethasone  dipropionate 0.05 % lotion Apply 1 Application topically as needed.     Cholecalciferol (HM VITAMIN D3) 100 MCG (4000 UT) CAPS Take by mouth daily.     cyclobenzaprine (FLEXERIL) 10 MG tablet Take 10 mg by mouth 3 (three) times daily as needed.     ezetimibe  (ZETIA ) 10 MG tablet TAKE 1 TABLET BY MOUTH EVERY DAY 90 tablet 3   hydroxychloroquine  (PLAQUENIL ) 200 MG tablet Take 200 mg by mouth at bedtime.     ibuprofen (ADVIL) 400 MG tablet Take 400 mg by mouth daily as needed for moderate pain (pain score 4-6).     LORazepam (ATIVAN) 0.5 MG tablet as needed.      metFORMIN (GLUCOPHAGE-XR) 500 MG 24 hr tablet Take 500 mg by mouth 3 (three) times daily. (Patient taking differently: Take 500 mg by mouth in the morning, at noon, in the evening, and at bedtime.)     metoprolol  succinate (TOPROL -XL) 25 MG 24 hr tablet TAKE 1 TABLET (25 MG TOTAL) BY MOUTH DAILY. 90 tablet 3   Multiple Vitamin (MULTIVITAMIN) tablet Take 1 tablet by mouth daily.     nitroGLYCERIN  (NITROSTAT ) 0.4 MG SL tablet PLACE 1 TABLET (0.4 MG TOTAL) UNDER THE TONGUE EVERY 5 (FIVE) MINUTES AS NEEDED. FOR CHEST PAIN 25 tablet 3   omeprazole (PRILOSEC) 40 MG capsule Take 40 mg by mouth daily as needed.     triamcinolone  ointment (KENALOG) 0.1 % Apply 1 application topically daily as needed (lupus flares).     zolpidem  (AMBIEN ) 10 MG tablet Take 10 mg by mouth at bedtime as needed for sleep.     No facility-administered medications prior to visit.    ROS Reviewed all systems and reported negative except as above     Objective:   Vitals:   05/05/24 0957  BP: 129/84  Pulse: 68  Temp: 98 F (36.7 C)  TempSrc: Oral  SpO2: 99%  Weight: 126 lb 3.2 oz (57.2 kg)  Height: 4' 11 (1.499 m)    Physical Exam Constitutional:      Appearance: Normal appearance.  HENT:     Head: Normocephalic.     Right Ear: Tympanic membrane  normal.     Nose: Nose normal.     Mouth/Throat:     Mouth: Mucous membranes are moist.  Cardiovascular:     Rate and Rhythm: Normal rate and regular rhythm.  Pulmonary:  Effort: Pulmonary effort is normal.  Abdominal:     General: Abdomen is flat.  Skin:    General: Skin is warm.     Capillary Refill: Capillary refill takes less than 2 seconds.  Neurological:     General: No focal deficit present.     Mental Status: She is alert.       CBC    Component Value Date/Time   WBC 5.6 03/10/2021 1837   RBC 4.43 03/10/2021 1837   HGB 12.7 03/10/2021 1837   HGB 13.3 03/26/2012 1004   HCT 40.0 03/10/2021 1837   HCT 40.8 03/26/2012 1004   PLT 359 03/10/2021 1837   PLT 289 03/26/2012 1004   MCV 90.3 03/10/2021 1837   MCV 91.9 03/26/2012 1004   MCH 28.7 03/10/2021 1837   MCHC 31.8 03/10/2021 1837   RDW 14.4 03/10/2021 1837   RDW 15.3 (H) 03/26/2012 1004   LYMPHSABS 3.0 03/10/2021 1837   LYMPHSABS 2.6 03/26/2012 1004   MONOABS 0.5 03/10/2021 1837   MONOABS 0.3 03/26/2012 1004   EOSABS 0.0 03/10/2021 1837   EOSABS 0.0 03/26/2012 1004   BASOSABS 0.0 03/10/2021 1837   BASOSABS 0.0 03/26/2012 1004         Assessment & Plan:    Assessment & Plan Pulmonary nodules, right upper and lower lobes, suspicious for slow-growing malignancy Nodules in right upper and lower lobes, semi-solid, 1.4 cm, slow growth over three years, suggestive of non-aggressive malignancy. PET scan likely ineffective. - Schedule bronchoscopy for biopsy on November 20th. - Order pre-procedure CT scan for bronchoscopy. - Discussed potential post-procedure symptoms such as sore throat, cough, and minor bleeding. - Provided anticipatory guidance on potential complications and post-procedure care, including pneumothorax, pneumonia, and bleeding. - Coordinate with multidisciplinary team for post-biopsy treatment planning if cancer is confirmed.        Zola Herter, MD [facility] Office: [phone]

## 2024-05-05 NOTE — Patient Instructions (Signed)
  VISIT SUMMARY: You came in today to discuss the pulmonary nodules that were first identified in 2022. These nodules have shown slow growth over the past few years, and we are now planning further evaluation.  YOUR PLAN: -PULMONARY NODULES: Pulmonary nodules are small growths in the lungs. Your nodules have shown slow growth over the past few years, which suggests they may be a non-aggressive type of cancer. We will perform a bronchoscopy on November 20th to take a biopsy of the nodules. Before the procedure, you will need a CT scan. After the bronchoscopy, you might experience a sore throat, cough, or minor bleeding. We also discussed potential complications such as a collapsed lung, pneumonia, and more significant bleeding. We will work with a team of specialists to plan your treatment if the biopsy confirms cancer.  INSTRUCTIONS: Please schedule your bronchoscopy for November 20th and complete the pre-procedure CT scan. Follow the care instructions provided and contact us  if you experience any severe symptoms after the procedure.                      Contains text generated by Abridge.                                 Contains text generated by Abridge.

## 2024-05-05 NOTE — Telephone Encounter (Signed)
 Patient has been scheduled and letter has been given to Montia. Routing to [employer].

## 2024-05-20 ENCOUNTER — Ambulatory Visit
Admission: RE | Admit: 2024-05-20 | Discharge: 2024-05-20 | Disposition: A | Discharge status: Home / Self Care | Source: Ambulatory Visit

## 2024-05-20 DIAGNOSIS — R911 Solitary pulmonary nodule: Secondary | ICD-10-CM

## 2024-06-02 ENCOUNTER — Telehealth: Payer: Self-pay

## 2024-06-02 NOTE — Telephone Encounter (Signed)
 Pt called stating that sh has surgery in 2 days and she has not been given her CT results. Pt would like this review by Dr Zaida asap.

## 2024-06-03 ENCOUNTER — Encounter (HOSPITAL_COMMUNITY): Payer: Self-pay

## 2024-06-03 ENCOUNTER — Other Ambulatory Visit: Payer: Self-pay

## 2024-06-03 NOTE — Anesthesia Preprocedure Evaluation (Addendum)
 Anesthesia Evaluation  Patient identified by MRN, date of birth, ID band Patient awake    Reviewed: Allergy  & Precautions, NPO status , Patient's Chart, lab work & pertinent test results  History of Anesthesia Complications Negative for: history of anesthetic complications  Airway Mallampati: II  TM Distance: >3 FB Neck ROM: Full    Dental no notable dental hx. (+) Partial Upper, Missing, Teeth Intact,    Pulmonary former smoker R lung nodules   Pulmonary exam normal breath sounds clear to auscultation       Cardiovascular hypertension, Pt. on medications and Pt. on home beta blockers (-) angina + CAD, + Cardiac Stents and + Peripheral Vascular Disease  (-) Past MI Normal cardiovascular exam Rhythm:Regular Rate:Normal  2022:Nuclear stress test  Nuclear stress EF: 62%.  The left ventricular ejection fraction is normal (55-65%).  T wave inversion was noted during stress in the V3 and V4 leads, and returning to baseline after less than 1 min of recovery.  The study is normal with no evidence of ischemia or infarction.  This is a low risk study.    Neuro/Psych  PSYCHIATRIC DISORDERS Anxiety Depression       GI/Hepatic ,GERD  Medicated and Controlled,,  Endo/Other  diabetes, Type 2    Renal/GU      Musculoskeletal  (+) Arthritis , Osteoarthritis,    Abdominal   Peds  Hematology Lab Results      Component                Value               Date                      WBC                      5.0                 06/04/2024                HGB                      10.8 (L)            06/04/2024                HCT                      33.1 (L)            06/04/2024                MCV                      89.2                06/04/2024                PLT                      291                 06/04/2024              Anesthesia Other Findings   Reproductive/Obstetrics                               Anesthesia  Physical Anesthesia Plan  ASA: 3  Anesthesia Plan: General   Post-op Pain Management:    Induction: Intravenous  PONV Risk Score and Plan: Propofol  infusion, TIVA, Treatment may vary due to age or medical condition, Midazolam  and Ondansetron   Airway Management Planned: Oral ETT  Additional Equipment: None  Intra-op Plan:   Post-operative Plan: Extubation in OR  Informed Consent: I have reviewed the patients History and Physical, chart, labs and discussed the procedure including the risks, benefits and alternatives for the proposed anesthesia with the patient or authorized representative who has indicated his/her understanding and acceptance.     Dental advisory given  Plan Discussed with: CRNA and Surgeon  Anesthesia Plan Comments:          Anesthesia Quick Evaluation

## 2024-06-03 NOTE — Progress Notes (Signed)
 Anesthesia Chart Review:  68 year old female follows with cardiology for history of HLD, PAD, CAD. She had PCI of RCA in 2002. Cardiac catheterization in 2010 showed her RCA was still subtotally occluded distally. She then had 2 overlapping drug-eluting stents placed in the distal RCA. She had a total of 3 stents in the RCA. Stress test in 2013 in the setting of chest pain showed possible lateral wall ischemia. Cardiac catheterization in October 2013 showed severe stenosis in the OM treated with bare-metal stent. Stress test in August 2016 showed no ischemia. Echocardiogram in December 2021 showed EF 60 to 65%, no significant valve issue. Myoview  in April 2022 showed no evidence of ischemia.  Last seen in follow-up by Scot Ford, PA on 11/15/2023.  At that time she reported improvement in PAD symptoms after structured exercise therapy.  Denied any chest pain, no cardiac complaints.  No changes to management, 6-7 month follow-up recommended.  Recently seen by her pulmonologist Dr. Zaida for evaluation of pulmonary nodules.  Per note, nodules were first identified in 2022 during a CT scan performed for shortness of breath and concern for pulmonary embolism. The initial scan revealed a 9 mm ground glass opacity in the right lower lobe. Subsequent CT scans in 2023 and 2024 demonstrated slow growth of the nodules, with the right lower lobe nodule increasing to 1.1 cm and a new 5 mm nodule appearing in the right upper lobe. By 2025, both nodules had grown to approximately 1.4 cm. The nodules are semi-solid.  Bronchoscopy with biopsy recommended.  Other pertinent history includes former smoker (quit 2013), non-insulin -dependent DM2 (A1c 8.2 on 04/22/2024), discoid lupus on Plaquenil , GERD on PPI.  Patient will need day of surgery labs and evaluation.  EKG 11/15/2023: Normal sinus rhythm.  Rate 86. Possible Left atrial enlargement  CT super D chest 05/20/2024: IMPRESSION: 1. Somewhat thick-walled right upper lobe  atypical pulmonary cyst and ground-glass right lower lobe nodule, stable in the short interval from 04/21/2024 but enlarged from 01/17/2022, worrisome for indolent adenocarcinomas. 2. Punctate left renal stones. 3. Aortic atherosclerosis (ICD10-I70.0). Coronary artery calcification. 4.  Emphysema (ICD10-J43.9).   Nuclear stress test 10/25/2020: Nuclear stress EF: 62%. The left ventricular ejection fraction is normal (55-65%). T wave inversion was noted during stress in the V3 and V4 leads, and returning to baseline after less than 1 min of recovery. The study is normal with no evidence of ischemia or infarction. This is a low risk study.  TTE 06/21/2020: 1. Left ventricular ejection fraction, by estimation, is 60 to 65%. Left  ventricular ejection fraction by PLAX is 62 %. The left ventricle has  normal function. The left ventricle demonstrates regional wall motion  abnormalities (see scoring  diagram/findings for description). Left ventricular diastolic parameters  are consistent with Grade I diastolic dysfunction (impaired relaxation).   2. Right ventricular systolic function is normal. The right ventricular  size is mildly enlarged. Mildly increased right ventricular wall  thickness.   3. The mitral valve is grossly normal. No evidence of mitral valve  regurgitation.   4. The aortic valve is normal in structure. Aortic valve regurgitation is  not visualized.   Comparison(s): A prior study was performed on 03/09/2015. No significant  change from prior study. Prior images reviewed side by side.     Lynwood Geofm RIGGERS [facility]/Anesthesiology Phone [phone] 06/03/2024 10:57 AM

## 2024-06-03 NOTE — Progress Notes (Addendum)
 SDW CALL  Patient was given pre-op instructions over the phone. The opportunity was given for the patient to ask questions. No further questions asked. Patient verbalized understanding of instructions given.   PCP - Elsie Lesches Cardiologist - Dr. Lonni Cash   PPM/ICD - denies Device Orders - n/a Rep Notified - n/a  Chest CT - 05/23/24 EKG - 11/15/23 Stress Test - 10/25/20  ECHO - 06/21/20 Cardiac Cath -  2013  Sleep Study - denies CPAP - n/a  Fasting Blood Sugar - 110-126 Checks Blood Sugar _1____ times a day  Patient instructed to check blood sugar in the morning and if less than 70 to drink 1/2 cup of apple or cranberry juice.  Last dose of GLP1 agonist-  n/a GLP1 instructions:  n/a  Blood Thinner Instructions: n/a Aspirin  Instructions: patient states she was instructed to hold for 48 hours - last dose was on Sunday, November 16th.  Dr. Zaida is aware that Aspirin  was last taken on Sunday.    Patient has not taken Nitroglycerin  in over 3 years.   ERAS Protcol - NPO PRE-SURGERY Ensure or G2- n/a  COVID TEST-  n/a   Anesthesia review: yes - cardiac history  Patient denies shortness of breath, fever, cough and chest pain over the phone call   All instructions explained to the patient, with a verbal understanding of the material. Patient agrees to go over the instructions while at home for a better understanding.

## 2024-06-04 ENCOUNTER — Encounter (HOSPITAL_COMMUNITY): Admission: RE | Disposition: A | Discharge status: Home / Self Care | Payer: Self-pay | Source: Home / Self Care

## 2024-06-04 ENCOUNTER — Encounter (HOSPITAL_COMMUNITY): Payer: Self-pay

## 2024-06-04 ENCOUNTER — Ambulatory Visit (HOSPITAL_COMMUNITY): Admission: RE | Admit: 2024-06-04 | Discharge: 2024-06-04 | Disposition: A | Discharge status: Home / Self Care

## 2024-06-04 ENCOUNTER — Ambulatory Visit (HOSPITAL_COMMUNITY): Admitting: Physician Assistant

## 2024-06-04 ENCOUNTER — Ambulatory Visit (HOSPITAL_COMMUNITY)

## 2024-06-04 DIAGNOSIS — C3431 Malignant neoplasm of lower lobe, right bronchus or lung: Secondary | ICD-10-CM | POA: Diagnosis not present

## 2024-06-04 DIAGNOSIS — Z79899 Other long term (current) drug therapy: Secondary | ICD-10-CM | POA: Diagnosis not present

## 2024-06-04 DIAGNOSIS — J439 Emphysema, unspecified: Secondary | ICD-10-CM | POA: Diagnosis not present

## 2024-06-04 DIAGNOSIS — M1990 Unspecified osteoarthritis, unspecified site: Secondary | ICD-10-CM | POA: Diagnosis not present

## 2024-06-04 DIAGNOSIS — Z8249 Family history of ischemic heart disease and other diseases of the circulatory system: Secondary | ICD-10-CM | POA: Diagnosis not present

## 2024-06-04 DIAGNOSIS — I700 Atherosclerosis of aorta: Secondary | ICD-10-CM | POA: Insufficient documentation

## 2024-06-04 DIAGNOSIS — E785 Hyperlipidemia, unspecified: Secondary | ICD-10-CM | POA: Diagnosis not present

## 2024-06-04 DIAGNOSIS — F419 Anxiety disorder, unspecified: Secondary | ICD-10-CM | POA: Diagnosis not present

## 2024-06-04 DIAGNOSIS — K219 Gastro-esophageal reflux disease without esophagitis: Secondary | ICD-10-CM | POA: Insufficient documentation

## 2024-06-04 DIAGNOSIS — Z7984 Long term (current) use of oral hypoglycemic drugs: Secondary | ICD-10-CM | POA: Diagnosis not present

## 2024-06-04 DIAGNOSIS — R918 Other nonspecific abnormal finding of lung field: Secondary | ICD-10-CM | POA: Diagnosis not present

## 2024-06-04 DIAGNOSIS — Z7982 Long term (current) use of aspirin: Secondary | ICD-10-CM | POA: Diagnosis not present

## 2024-06-04 DIAGNOSIS — I2510 Atherosclerotic heart disease of native coronary artery without angina pectoris: Secondary | ICD-10-CM | POA: Insufficient documentation

## 2024-06-04 DIAGNOSIS — Z87891 Personal history of nicotine dependence: Secondary | ICD-10-CM

## 2024-06-04 DIAGNOSIS — I10 Essential (primary) hypertension: Secondary | ICD-10-CM | POA: Insufficient documentation

## 2024-06-04 DIAGNOSIS — N200 Calculus of kidney: Secondary | ICD-10-CM | POA: Diagnosis not present

## 2024-06-04 DIAGNOSIS — Z955 Presence of coronary angioplasty implant and graft: Secondary | ICD-10-CM | POA: Insufficient documentation

## 2024-06-04 DIAGNOSIS — L930 Discoid lupus erythematosus: Secondary | ICD-10-CM | POA: Insufficient documentation

## 2024-06-04 DIAGNOSIS — E1151 Type 2 diabetes mellitus with diabetic peripheral angiopathy without gangrene: Secondary | ICD-10-CM | POA: Insufficient documentation

## 2024-06-04 HISTORY — PX: VIDEO BRONCHOSCOPY WITH ENDOBRONCHIAL NAVIGATION: SHX6175

## 2024-06-04 HISTORY — PX: BRONCHIAL NEEDLE ASPIRATION BIOPSY: SHX5106

## 2024-06-04 HISTORY — PX: BRONCHIAL BRUSHINGS: SHX5108

## 2024-06-04 HISTORY — PX: BRONCHIAL WASHINGS: SHX5105

## 2024-06-04 HISTORY — DX: Unspecified osteoarthritis, unspecified site: M19.90

## 2024-06-04 LAB — COMPREHENSIVE METABOLIC PANEL WITH GFR
ALT: 25 U/L (ref 0–44)
AST: 25 U/L (ref 15–41)
Albumin: 3.6 g/dL (ref 3.5–5.0)
Alkaline Phosphatase: 51 U/L (ref 38–126)
Anion gap: 14 (ref 5–15)
BUN: 11 mg/dL (ref 8–23)
CO2: 23 mmol/L (ref 22–32)
Calcium: 9.1 mg/dL (ref 8.9–10.3)
Chloride: 103 mmol/L (ref 98–111)
Creatinine, Ser: 0.86 mg/dL (ref 0.44–1.00)
GFR, Estimated: >60 mL/min (ref >60)
Glucose, Bld: 108 mg/dL — ABNORMAL HIGH (ref 70–99)
Potassium: 3.7 mmol/L (ref 3.5–5.1)
Sodium: 140 mmol/L (ref 135–145)
Total Bilirubin: 1 mg/dL (ref 0.0–1.2)
Total Protein: 6 g/dL — ABNORMAL LOW (ref 6.5–8.1)

## 2024-06-04 LAB — CBC
HCT: 33.1 % — ABNORMAL LOW (ref 36.0–46.0)
Hemoglobin: 10.8 g/dL — ABNORMAL LOW (ref 12.0–15.0)
MCH: 29.1 pg (ref 26.0–34.0)
MCHC: 32.6 g/dL (ref 30.0–36.0)
MCV: 89.2 fL (ref 80.0–100.0)
Platelets: 291 K/uL (ref 150–400)
RBC: 3.71 MIL/uL — ABNORMAL LOW (ref 3.87–5.11)
RDW: 14.9 % (ref 11.5–15.5)
WBC: 5 K/uL (ref 4.0–10.5)
nRBC: 0 % (ref 0.0–0.2)

## 2024-06-04 LAB — GLUCOSE, CAPILLARY
Glucose-Capillary: 98 mg/dL (ref 70–99)
Glucose-Capillary: 133 mg/dL — ABNORMAL HIGH (ref 70–99)

## 2024-06-04 SURGERY — VIDEO BRONCHOSCOPY WITH ENDOBRONCHIAL NAVIGATION
Anesthesia: General | Laterality: Right

## 2024-06-04 MED ORDER — FENTANYL CITRATE (PF) 100 MCG/2ML IJ SOLN
INTRAMUSCULAR | Status: AC
  Filled 2024-06-04: qty 2

## 2024-06-04 MED ORDER — PHENYLEPHRINE 80 MCG/ML (10ML) SYRINGE FOR IV PUSH (FOR BLOOD PRESSURE SUPPORT)
PREFILLED_SYRINGE | INTRAVENOUS | Status: DC | PRN
  Administered 2024-06-04: 160 ug via INTRAVENOUS

## 2024-06-04 MED ORDER — PHENYLEPHRINE HCL-NACL 20-0.9 MG/250ML-% IV SOLN
INTRAVENOUS | Status: DC | PRN
  Administered 2024-06-04: 30 ug/min via INTRAVENOUS

## 2024-06-04 MED ORDER — CHLORHEXIDINE GLUCONATE 0.12 % MT SOLN
15.0000 mL | Freq: Once | OROMUCOSAL | Status: AC
  Administered 2024-06-04: 15 mL via OROMUCOSAL
  Filled 2024-06-04: qty 15

## 2024-06-04 MED ORDER — FENTANYL CITRATE (PF) 250 MCG/5ML IJ SOLN
INTRAMUSCULAR | Status: DC | PRN
  Administered 2024-06-04: 100 ug via INTRAVENOUS

## 2024-06-04 MED ORDER — LACTATED RINGERS IV SOLN: INTRAVENOUS | Status: DC

## 2024-06-04 MED ORDER — PROPOFOL 10 MG/ML IV BOLUS
INTRAVENOUS | Status: DC | PRN
  Administered 2024-06-04: 180 mg via INTRAVENOUS

## 2024-06-04 MED ORDER — ROCURONIUM BROMIDE 10 MG/ML (PF) SYRINGE
PREFILLED_SYRINGE | INTRAVENOUS | Status: DC | PRN
  Administered 2024-06-04: 10 mg via INTRAVENOUS
  Administered 2024-06-04: 60 mg via INTRAVENOUS

## 2024-06-04 MED ORDER — LIDOCAINE 2% (20 MG/ML) 5 ML SYRINGE
INTRAMUSCULAR | Status: DC | PRN
  Administered 2024-06-04: 100 mg via INTRAVENOUS

## 2024-06-04 MED ORDER — SUGAMMADEX SODIUM 200 MG/2ML IV SOLN
INTRAVENOUS | Status: DC | PRN
  Administered 2024-06-04: 200 mg via INTRAVENOUS

## 2024-06-04 MED ORDER — ONDANSETRON HCL 4 MG/2ML IJ SOLN
INTRAMUSCULAR | Status: DC | PRN
  Administered 2024-06-04: 4 mg via INTRAVENOUS

## 2024-06-04 MED ORDER — PROPOFOL 500 MG/50ML IV EMUL
INTRAVENOUS | Status: DC | PRN
  Administered 2024-06-04: 150 ug/kg/min via INTRAVENOUS

## 2024-06-04 MED ORDER — DEXAMETHASONE SOD PHOSPHATE PF 10 MG/ML IJ SOLN
INTRAMUSCULAR | Status: DC | PRN
  Administered 2024-06-04: 10 mg via INTRAVENOUS

## 2024-06-04 NOTE — Anesthesia Postprocedure Evaluation (Signed)
 Anesthesia Post Note  Patient: Geraline GAILS Study  Procedure(s) Performed: VIDEO BRONCHOSCOPY WITH ENDOBRONCHIAL NAVIGATION (Right) BRONCHOSCOPY, WITH BRUSH BIOPSY BRONCHOSCOPY, WITH NEEDLE ASPIRATION BIOPSY IRRIGATION, BRONCHUS     Patient location during evaluation: Endoscopy Anesthesia Type: General Level of consciousness: awake and alert Pain management: pain level controlled Vital Signs Assessment: post-procedure vital signs reviewed and stable Respiratory status: spontaneous breathing, nonlabored ventilation, respiratory function stable and patient connected to nasal cannula oxygen Cardiovascular status: blood pressure returned to baseline and stable Postop Assessment: no apparent nausea or vomiting Anesthetic complications: no   No notable events documented.  Last Vitals:  Vitals:   06/04/24 1000 06/04/24 1013  BP: 101/60 109/73  Pulse: 81 81  Resp: 17 17  Temp:  37.1 C  SpO2: 91% 94%    Last Pain:  Vitals:   06/04/24 0945  TempSrc:   PainSc: 0-No pain                 Garnette DELENA Gab

## 2024-06-04 NOTE — Op Note (Signed)
 Video Bronchoscopy with Endobronchial Ultrasound and Electromagnetic Navigation Procedure Note  Date of Operation: 06/04/2024  Pre-op Diagnosis: Right upper lobe atypical cyst and right lower lobe GGO nodule  Surgeon: Zola Herter, MD   Anesthesia: General endotracheal anesthesia  Operation: Flexible video fiberoptic bronchoscopy with endobronchial ultrasound, robotic assisted navigation and biopsies.  Estimated Blood Loss: Minimal  Complications: None  Indications and History: [name] is a 68 y.o. female with a right upper lobe atypical cyst and a right lower lobe GGO.  Recommendation was made to achieve a tissue diagnosis using endobronchial ultrasound and robotic assisted navigational bronchoscopy. The risks, benefits, complications, treatment options and expected outcomes were discussed with the patient.  The possibilities of pneumothorax, pneumonia, reaction to medication, pulmonary aspiration, perforation of a viscus, bleeding, failure to diagnose a condition and creating a complication requiring transfusion or operation were discussed with the patient who freely signed the consent.    Description of Procedure: The patient was seen in the Preoperative Area, was examined and was deemed appropriate to proceed.  The patient was taken to [facility] Endoscopy room 3, identified as [name] and the procedure verified as Flexible Video Fiberoptic Bronchoscopy with robotic assisted navigation and endobronchial ultrasound.  A Time Out was held and the above information confirmed.   Robotic assisted navigation: Prior to the date of the procedure a high-resolution CT scan of the chest was performed. Utilizing ION software program a virtual tracheobronchial tree was generated to allow the creation of distinct navigation pathways to the patient's parenchymal abnormalities. After being taken to the operating room general anesthesia was initiated and the patient  was orally intubated. The  video fiberoptic bronchoscope was introduced via the endotracheal tube and a general inspection was performed which showed normal right and left lung anatomy. Aspiration of the bilateral mainstems was completed to remove any remaining secretions. Robotic catheter inserted into patient's endotracheal tube.   Target #1 Right upper lobe atypical cyst: The distinct navigation pathways prepared prior to this procedure were then utilized to navigate to patient's lesion identified on CT scan. The robotic catheter was secured into place and the vision probe was withdrawn.  Lesion location was approximated using fluoroscopy.  Local registration and targeting was performed using [employer] Cios mobile C-arm three-dimensional imaging. Radial EBUS used to confirm lesion and a concentric view was obtained. Under fluoroscopic guidance transbronchial needle brushings, transbronchial needle biopsies, and transbronchial forceps biopsies were performed to be sent for cytology and pathology.  Needle-in-lesion was confirmed using Cios mobile C-arm.      Target #2 Right lower lobe GGO : The distinct navigation pathways prepared prior to this procedure were then utilized to navigate to patient's lesion identified on CT scan. The robotic catheter was secured into place and the vision probe was withdrawn.  Lesion location was approximated using fluoroscopy.  Local registration and targeting was performed using [employer] Cios mobile C-arm three-dimensional imaging. REBUS probe used to confirm lesion with concentric view obtained. Under fluoroscopic guidance transbronchial needle brushings, transbronchial needle biopsies, and transbronchial cryobiopsies were performed to be sent for cytology and pathology.  Needle-in-lesion was confirmed using Cios mobile C-arm.     Samples Target #1: 1. Transbronchial needle brushings from right upper lobe nodule/cyst  2. Transbronchial Wang needle biopsies from right upper  lobe nodule 3. Transbronchial cryobiopsies from right upper lobe nodule   Samples Target #2:  1. Transbronchial Wang needle biopsies from right lower lobe ground glass nodule 2. Bronchoalveolar lavage from right  lower lobe      Zola Herter, MD 06/04/2024, 9:34 AM [facility]

## 2024-06-04 NOTE — Transfer of Care (Signed)
 Immediate Anesthesia Transfer of Care Note  Patient: [name]  Procedure(s) Performed: VIDEO BRONCHOSCOPY WITH ENDOBRONCHIAL NAVIGATION (Right) BRONCHOSCOPY, WITH EBUS  Patient Location: PACU  Anesthesia Type:General  Level of Consciousness: awake, alert , and oriented  Airway & Oxygen Therapy: Patient Spontanous Breathing and Patient connected to face mask oxygen  Post-op Assessment: Report given to RN, Post -op Vital signs reviewed and stable, and Patient moving all extremities X 4  Post vital signs: Reviewed and stable  Last Vitals:  Vitals Value Taken Time  BP 105/61 06/04/24 09:46  Temp 37.1 C 06/04/24 09:45  Pulse 83 06/04/24 09:53  Resp 22 06/04/24 09:53  SpO2 93 % 06/04/24 09:53  Vitals shown include unfiled device data.  Last Pain:  Vitals:   06/04/24 0945  TempSrc:   PainSc: 0-No pain         Complications: No notable events documented.

## 2024-06-04 NOTE — Anesthesia Procedure Notes (Signed)
 Procedure Name: Intubation Date/Time: 06/04/2024 7:51 AM  Performed by: Canda Augustin KIDD, CRNAPre-anesthesia Checklist: Patient identified, Emergency Drugs available, Suction available, Patient being monitored and Timeout performed Patient Re-evaluated:Patient Re-evaluated prior to induction Oxygen Delivery Method: Circle system utilized Preoxygenation: Pre-oxygenation with 100% oxygen Induction Type: IV induction Ventilation: Mask ventilation without difficulty Laryngoscope Size: Mac and 3 Grade View: Grade I Tube type: Oral Tube size: 8.5 mm Number of attempts: 1 Airway Equipment and Method: Stylet and Oral airway Placement Confirmation: ETT inserted through vocal cords under direct vision, positive ETCO2 and breath sounds checked- equal and bilateral Tube secured with: Tape Dental Injury: Teeth and Oropharynx as per pre-operative assessment  Comments: atramatic

## 2024-06-04 NOTE — Interval H&P Note (Signed)
 History and Physical Interval Note:  06/04/2024 7:23 AM  [name]  has presented today for surgery, with the diagnosis of Two right lung nodules.  The various methods of treatment have been discussed with the patient and family. After consideration of risks, benefits and other options for treatment, the patient has consented to  Procedure(s): VIDEO BRONCHOSCOPY WITH ENDOBRONCHIAL NAVIGATION (Right) BRONCHOSCOPY, WITH EBUS (N/A) as a surgical intervention.  The patient's history has been reviewed, patient examined, no change in status, stable for surgery.  I have reviewed the patient's chart and labs.  Questions were answered to the patient's satisfaction.     [name]

## 2024-06-05 ENCOUNTER — Encounter: Payer: Self-pay | Admitting: Cardiovascular Disease

## 2024-06-05 ENCOUNTER — Other Ambulatory Visit: Payer: Self-pay

## 2024-06-05 LAB — ACID FAST SMEAR (AFB, MYCOBACTERIA): Acid Fast Smear: NEGATIVE

## 2024-06-05 MED ORDER — NITROGLYCERIN 0.4 MG SL SUBL: SUBLINGUAL_TABLET | SUBLINGUAL | 3 refills | Status: AC

## 2024-06-05 NOTE — Progress Notes (Signed)
 Refill sent at patient request for expired nitroglycerin 

## 2024-06-08 ENCOUNTER — Telehealth: Payer: Self-pay

## 2024-06-08 DIAGNOSIS — C3491 Malignant neoplasm of unspecified part of right bronchus or lung: Secondary | ICD-10-CM

## 2024-06-08 LAB — CYTOLOGY - NON PAP

## 2024-06-08 NOTE — Telephone Encounter (Signed)
 Dr. Zaida pt is calling office again, can you please advise of CT results 11/5.  Pt has Bronch follow up 12/1.

## 2024-06-08 NOTE — Telephone Encounter (Signed)
 Ms Doubleday called the office after she saw her biopsy results on MyChart. I updated her about the results of her biopsy showing adenocarcinoma. We discussed potential treatment options based on PET scan and MRI and more information to be provided with her visit with Lauraine Lites, NP. Patient's questions were answered. MRI and PET scan ordered.   Zola Herter, MD [facility] Office: [phone]   See Amion for personal pager PCCM on call pager [phone] until 7pm. Please call Elink 7p-7a. [phone]

## 2024-06-09 ENCOUNTER — Ambulatory Visit: Admission: RE | Admit: 2024-06-09 | Discharge: 2024-06-09 | Disposition: A | Source: Ambulatory Visit

## 2024-06-09 DIAGNOSIS — C3491 Malignant neoplasm of unspecified part of right bronchus or lung: Secondary | ICD-10-CM

## 2024-06-09 LAB — AEROBIC/ANAEROBIC CULTURE W GRAM STAIN (SURGICAL/DEEP WOUND): Culture: NORMAL

## 2024-06-15 ENCOUNTER — Encounter: Payer: Self-pay | Admitting: Acute Care

## 2024-06-15 ENCOUNTER — Ambulatory Visit: Admitting: Acute Care

## 2024-06-15 VITALS — BP 108/80 | HR 72 | Temp 97.8°F | Ht 59.0 in | Wt 122.2 lb

## 2024-06-15 DIAGNOSIS — E119 Type 2 diabetes mellitus without complications: Secondary | ICD-10-CM | POA: Diagnosis not present

## 2024-06-15 DIAGNOSIS — C3481 Malignant neoplasm of overlapping sites of right bronchus and lung: Secondary | ICD-10-CM | POA: Diagnosis not present

## 2024-06-15 DIAGNOSIS — Z87891 Personal history of nicotine dependence: Secondary | ICD-10-CM

## 2024-06-15 DIAGNOSIS — Z7984 Long term (current) use of oral hypoglycemic drugs: Secondary | ICD-10-CM | POA: Diagnosis not present

## 2024-06-15 DIAGNOSIS — R911 Solitary pulmonary nodule: Secondary | ICD-10-CM

## 2024-06-15 DIAGNOSIS — Z9889 Other specified postprocedural states: Secondary | ICD-10-CM

## 2024-06-15 DIAGNOSIS — R9389 Abnormal findings on diagnostic imaging of other specified body structures: Secondary | ICD-10-CM

## 2024-06-15 NOTE — Patient Instructions (Addendum)
 It is good to see you today. I am glad you have done well after your bronchoscopy with biopsies.  We have reviewed the results of your biopsy. This shows adenocarcinoma of the Right upper lobe and Right Lower lobe.  This is a non small cell lung cancer.  Dr. Zaida has ordered both the PET scan and MRI Brain.  The MRI Brain has not been read yet. I have referred you to radiation oncology and medical oncology for consultation to go over options to treat your cancer. You will get calls to get these appointments scheduled.  Follow up with Lauraine NP on 06/26/2024 to review results of the MR brain and PET scan. If someone has already reviewed these with you, it is ok to cancer that appointment. Metta Sours with your treatment. Please contact office for sooner follow up if symptoms do not improve or worsen or seek emergency care

## 2024-06-15 NOTE — Progress Notes (Signed)
 History of Present Illness [name] is a 68 y.o. female former smoker with coronary artery disease who presents for evaluation of pulmonary nodules.She was seen by Dr. Zaida 05/05/2024. She underwent bronchoscopy with biopsies 06/04/2024.   06/15/2024 Discussed the use of AI scribe software for clinical note transcription with the patient, who gave verbal consent to proceed.  History of Present Illness Pt. Presents for  follow up after navigational bronchoscopy with biopsies.    [name] is a 68 year old female with pulmonary nodules who presents for follow-up after a bronchoscopy with biopsy on 06/04/2024.  She underwent a bronchoscopy with biopsy, which revealed adenocarcinoma in the right upper lobe and right lower lobe of the lung. Post-procedure, she experienced no significant bleeding, only a few flecks that resolved on her own. She had no fever or discolored secretions, although she felt febrile but confirmed her temperature was normal.  She feels congested with a headache around her temples. She has taken Tylenol  once daily for three days for relief. There is no significant nasal discharge, and when she blows her nose, the discharge is clear. She does not use Flonase or other decongestants but is considering using Mucinex for congestion relief.I have told her to call to be seen if her secretions change color.   Her past medical history includes peripheral arterial disease. She has known history of coronary artery disease, previous tobacco use, hyperlipidemia, diabetes, and percutaneous lupus  . She has undergone an MRI of the brain, which is pending review, and is scheduled for a PET scan to complete staging of her cancer.  She manages type 2 diabetes with metformin, she is compliant with this medication.  She mentions a previous incidental finding related to her rotator cuff, which is not currently being addressed due to the focus on cancer treatment.    Test  Results: Cytology 06/04/2024 FINAL MICROSCOPIC DIAGNOSIS:  A. LUNG, RUL NODULE, FINE NEEDLE ASPIRATION  BIOPSY:  - Positive for malignancy.  - Adenocarcinoma.   B. LUNG, RUL NODULE, BRUSHING:  - Positive for malignancy.  - Adenocarcinoma.   C. LUNG, RLL NODULE TARGET 2, FINE NEEDLE ASPIRATION:  - Positive for malignancy.  - Adenocarcinoma.   D. LUNG, RLL, LAVAGE:    FINAL MICROSCOPIC DIAGNOSIS:  - Malignant cells present  - Adenocarcinoma     Latest Ref Rng & Units 06/04/2024    6:29 AM 03/10/2021    6:37 PM 03/05/2021    5:11 PM  CBC  WBC 4.0 - 10.5 K/uL 5.0  5.6  5.5   Hemoglobin 12.0 - 15.0 g/dL 89.1  87.2  88.5   Hematocrit 36.0 - 46.0 % 33.1  40.0  35.9   Platelets 150 - 400 K/uL 291  359  329        Latest Ref Rng & Units 06/04/2024    6:29 AM 03/10/2021    6:37 PM 03/05/2021    5:11 PM  BMP  Glucose 70 - 99 mg/dL 891  884  877   BUN 8 - 23 mg/dL 11  14  22    Creatinine 0.44 - 1.00 mg/dL 9.13  9.20  8.91   Sodium 135 - 145 mmol/L 140  140  138   Potassium 3.5 - 5.1 mmol/L 3.7  4.0  4.1   Chloride 98 - 111 mmol/L 103  106  104   CO2 22 - 32 mmol/L 23  25  26    Calcium  8.9 - 10.3 mg/dL 9.1  9.8  9.5     BNP No results found for: BNP  ProBNP No results found for: PROBNP  PFT No results found for: FEV1PRE, FEV1POST, FVCPRE, FVCPOST, TLC, DLCOUNC, PREFEV1FVCRT, PSTFEV1FVCRT  DG Chest Port 1 View Result Date: 06/04/2024 EXAM: 1 VIEW(S) XRAY OF THE CHEST 06/04/2024 09:58:00 AM COMPARISON: CT chest 05/20/2024 and chest x ray 05/31/2021. CLINICAL HISTORY: [id number] S/P bronchoscopy [id number] S/P bronchoscopy. FINDINGS: LUNGS AND PLEURA: Lungs are adequately inflated. Mild focal opacification of the right upper lobe likely resulting from patient's recent bronchoscopy with biopsy. No pleural effusion. No pneumothorax. HEART AND MEDIASTINUM: No acute abnormality of the cardiac and mediastinal silhouettes. BONES AND SOFT TISSUES: No acute osseous  abnormality. No acute soft tissue abnormality. IMPRESSION: 1. Mild focal opacification of the right upper lobe, likely post-procedural from recent bronchoscopy with biopsy. 2. No acute airspace consolidation or pleural effusion. Electronically signed by: Toribio Agreste MD 06/04/2024 10:18 AM EST RP Workstation: [id number]   DG C-ARM BRONCHOSCOPY Result Date: 06/04/2024 C-ARM BRONCHOSCOPY: Fluoroscopy was utilized by the requesting physician.  No radiographic interpretation.   DG C-Arm 1-60 Min-No Report Result Date: 06/04/2024 Fluoroscopy was utilized by the requesting physician.  No radiographic interpretation.   CT SUPER D CHEST WO CONTRAST Result Date: 05/23/2024 CLINICAL DATA:  Lung nodule. EXAM: CT CHEST WITHOUT CONTRAST TECHNIQUE: Multidetector CT imaging of the chest was performed using thin slice collimation for electromagnetic bronchoscopy planning purposes, without intravenous contrast. RADIATION DOSE REDUCTION: This exam was performed according to the departmental dose-optimization program which includes automated exposure control, adjustment of the mA and/or kV according to patient size and/or use of iterative reconstruction technique. COMPARISON:  04/21/2024, 04/22/2023, 01/17/2022. FINDINGS: Cardiovascular: Atherosclerotic calcification of the aorta, aortic valve and coronary arteries. Heart size normal. No pericardial effusion. Mediastinum/Nodes: No pathologically enlarged mediastinal or axillary lymph nodes. Hilar regions are difficult to definitively evaluate without IV contrast. Esophagus is grossly unremarkable. Lungs/Pleura: Centrilobular and paraseptal emphysema. Somewhat thick-walled atypical pulmonary cyst in the right upper lobe measures 1.1 x 1.4 cm (5/40 8-49), enlarged from 5 x 6 mm on 01/17/2022. 1.2 x 1.3 cm ground-glass nodule in the anterior right lower lobe (5/86), slightly enlarged from 9 x 10 mm on 01/17/2022. No solid components. Additional scattered vague areas of  ground-glass nodularity bilaterally. Tiny subpleural nodule is considered benign. No pleural fluid. Airway is unremarkable. Upper Abdomen: Small low-attenuation lesion in the left kidney. No specific follow-up necessary. Punctate left renal stones. Visualized portions of the liver, adrenal glands, left kidney, spleen, pancreas, stomach and bowel are otherwise grossly unremarkable. Musculoskeletal: Degenerative changes in the spine. IMPRESSION: 1. Somewhat thick-walled right upper lobe atypical pulmonary cyst and ground-glass right lower lobe nodule, stable in the short interval from 04/21/2024 but enlarged from 01/17/2022, worrisome for indolent adenocarcinomas. 2. Punctate left renal stones. 3. Aortic atherosclerosis (ICD10-I70.0). Coronary artery calcification. 4.  Emphysema (ICD10-J43.9). Electronically Signed   By: Newell Eke M.D.   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   Depression    Diabetes mellitus without complication (  HCC)    type 2 dx july 2018   H/O: substance abuse (HCC)    Hyperlipidemia    Mass on back    Myocardial infarction [facility])    not sure; nobody ever tells me (04/23/2012)   Noncompliance    PVD (peripheral vascular disease)    MILD     Social History   Tobacco Use   Smoking status: Former    Current packs/day: 0.00    Average packs/day: 1.5 packs/day for 38.0 years (57.0 ttl pk-yrs)    Types: Cigarettes    Start date: 04/20/1974    Quit date: 04/20/2012    Years since quitting: 12.1   Smokeless tobacco: Never  Vaping Use   Vaping status: Never Used  Substance Use Topics    Alcohol use: Yes    Alcohol/week: 7.0 standard drinks of alcohol    Types: 7 Cans of beer per week   Drug use: Yes    Types: Marijuana    Comment: daily    Ms.Morel reports that she quit smoking about 12 years ago. Her smoking use included cigarettes. She started smoking about 50 years ago. She has a 57 pack-year smoking history. She has never used smokeless tobacco. She reports current alcohol use of about 7.0 standard drinks of alcohol per week. She reports current drug use. Drug: Marijuana.  Tobacco Cessation: Counseling given: Not Answered Former smoker with a 57 pack year smoking history.  Past surgical hx, Family hx, Social hx all reviewed.  Current Outpatient Medications on File Prior to Visit  Medication Sig   acetaminophen  (TYLENOL ) 500 MG tablet Take 1,000 mg by mouth every 6 (six) hours as needed for mild pain. For pain   aspirin  81 MG tablet Take 81 mg by mouth at bedtime.   atorvastatin  (LIPITOR) 20 MG tablet Take 1 tablet (20 mg total) by mouth daily.   betamethasone  dipropionate 0.05 % lotion Apply 1 Application topically as needed.   Cholecalciferol (HM VITAMIN D3) 100 MCG (4000 UT) CAPS Take by mouth daily.   cyclobenzaprine (FLEXERIL) 10 MG tablet Take 10 mg by mouth 3 (three) times daily as needed.   ezetimibe  (ZETIA ) 10 MG tablet TAKE 1 TABLET BY MOUTH EVERY DAY   hydroxychloroquine  (PLAQUENIL ) 200 MG tablet Take 200 mg by mouth at bedtime.   ibuprofen (ADVIL) 400 MG tablet Take 400 mg by mouth daily as needed for moderate pain (pain score 4-6).   LORazepam (ATIVAN) 0.5 MG tablet as needed.    metFORMIN (GLUCOPHAGE-XR) 500 MG 24 hr tablet Take 500 mg by mouth 3 (three) times daily. (Patient taking differently: Take 500 mg by mouth in the morning, at noon, in the evening, and at bedtime.)   metoprolol  succinate (TOPROL -XL) 25 MG 24 hr tablet TAKE 1 TABLET (25 MG TOTAL) BY MOUTH DAILY.   Multiple Vitamin (MULTIVITAMIN) tablet Take 1 tablet by mouth daily.    nitroGLYCERIN  (NITROSTAT ) 0.4 MG SL tablet PLACE 1 TABLET (0.4 MG TOTAL) UNDER THE TONGUE EVERY 5 (FIVE) MINUTES AS NEEDED. FOR CHEST PAIN   omeprazole (PRILOSEC) 40 MG capsule Take 40 mg by mouth daily as needed.   triamcinolone  ointment (KENALOG) 0.1 % Apply 1 application topically daily as needed (lupus flares).   zolpidem  (AMBIEN ) 10 MG tablet Take 10 mg by mouth at bedtime as needed for sleep.   No current facility-administered medications on file prior to visit.     Allergies  Allergen Reactions   Chantix [Varenicline] Other (See Comments)    Review Of  Systems:  Constitutional:   No  weight loss, night sweats,  Fevers, chills, fatigue, or  lassitude.  HEENT:   No headaches,  Difficulty swallowing,  Tooth/dental problems, or  Sore throat,                No sneezing, itching, ear ache, nasal congestion, post nasal drip,   CV:  No chest pain,  Orthopnea, PND, swelling in lower extremities, anasarca, dizziness, palpitations, syncope.   GI  No heartburn, indigestion, abdominal pain, nausea, vomiting, diarrhea, change in bowel habits, loss of appetite, bloody stools.   Resp: No shortness of breath with exertion or at rest.  No excess mucus, no productive cough,  No non-productive cough,  No coughing up of blood.  No change in color of mucus.  No wheezing.  No chest wall deformity  Skin: no rash or lesions.  GU: no dysuria, change in color of urine, no urgency or frequency.  No flank pain, no hematuria   MS:  No joint pain or swelling.  No decreased range of motion.  No back pain.  Psych:  No change in mood or affect. No depression or anxiety.  No memory loss.   Vital Signs BP 108/80   Pulse 72   Temp 97.8 F (36.6 C) (Oral)   Ht 4' 11 (1.499 m)   Wt 122 lb 3.2 oz (55.4 kg)   SpO2 100%   BMI 24.68 kg/m     Physical Exam GENERAL: No distress, alert and oriented times 3. EARS NOSE THROAT: No sinus tenderness, tympanic membranes clear, pale nasal mucosa, no oral  exudate, no post nasal drip, no lymphadenopathy. CHEST: Lungs clear to auscultation bilaterally. No wheeze, rales, dullness, no accessory muscle use, no nasal flaring, no sternal retractions. CARDIAC: S1, S2, regular rate and rhythm, no murmur. ABDOMINAL: Soft, non tender. ND, BS present, EXTREMITIES: No clubbing, cyanosis, edema. No obvious deformities. NEUROLOGICAL: Normal strength. Alert and oriented x 3, MAE x 4. SKIN: No rashes, warm and dry. No obvious skin lesions. PSYCHIATRIC: Normal mood and behavior.   Assessment/Plan  Assessment and Plan Assessment & Plan Non-small cell lung cancer of right upper and lower lobes PET scan and MRI brain pending.  Post bronchoscopy with biopsies - Ordered PET scan from base of skull to top of thighs. - Referred to radiation oncology for consultation. - Referred to medical oncology for consultation. - Await MRI brain results. - Schedule follow-up after PET scan to discuss results. - Consider surgery as treatment option after staging has been completed.   Type 2 diabetes mellitus Managed with metformin. Blood sugar levels well-controlled. Discussed monitoring around PET scan and metformin administration. - Continue metformin as prescribed. - Monitor blood sugar levels closely.   I spent 40 minutes dedicated to the care of this patient on the date of this encounter to include pre-visit review of records, face-to-face time with the patient discussing conditions above, post visit ordering of testing, clinical documentation with the electronic health record, making appropriate referrals as documented, and communicating necessary information to the patient's healthcare team.      Lauraine JULIANNA Lites, NP 06/15/2024  10:14 AM

## 2024-06-18 ENCOUNTER — Other Ambulatory Visit: Payer: Self-pay

## 2024-06-18 NOTE — Progress Notes (Signed)
 The proposed treatment discussed in conference is for discussion purpose only and is not a binding recommendation.  The patients have not been physically examined, or presented with their treatment options.  Therefore, final treatment plans cannot be decided.

## 2024-06-19 ENCOUNTER — Other Ambulatory Visit: Payer: Self-pay | Admitting: Medical Oncology

## 2024-06-19 ENCOUNTER — Ambulatory Visit (HOSPITAL_COMMUNITY): Admission: RE | Admit: 2024-06-19 | Discharge: 2024-06-19

## 2024-06-19 DIAGNOSIS — C3491 Malignant neoplasm of unspecified part of right bronchus or lung: Secondary | ICD-10-CM

## 2024-06-19 DIAGNOSIS — R918 Other nonspecific abnormal finding of lung field: Secondary | ICD-10-CM

## 2024-06-19 LAB — GLUCOSE, CAPILLARY: Glucose-Capillary: 101 mg/dL — ABNORMAL HIGH (ref 70–99)

## 2024-06-19 MED ORDER — FLUDEOXYGLUCOSE F - 18 (FDG) INJECTION
6.2000 | Freq: Once | INTRAVENOUS | Status: AC
  Administered 2024-06-19: 6.08 via INTRAVENOUS

## 2024-06-22 ENCOUNTER — Inpatient Hospital Stay: Attending: Internal Medicine | Admitting: Internal Medicine

## 2024-06-22 ENCOUNTER — Inpatient Hospital Stay

## 2024-06-22 VITALS — BP 123/85 | HR 70 | Temp 97.4°F | Resp 17 | Ht 59.0 in | Wt 123.0 lb

## 2024-06-22 DIAGNOSIS — C3490 Malignant neoplasm of unspecified part of unspecified bronchus or lung: Secondary | ICD-10-CM

## 2024-06-22 DIAGNOSIS — Z801 Family history of malignant neoplasm of trachea, bronchus and lung: Secondary | ICD-10-CM | POA: Diagnosis not present

## 2024-06-22 DIAGNOSIS — R918 Other nonspecific abnormal finding of lung field: Secondary | ICD-10-CM

## 2024-06-22 DIAGNOSIS — C3431 Malignant neoplasm of lower lobe, right bronchus or lung: Secondary | ICD-10-CM | POA: Diagnosis not present

## 2024-06-22 DIAGNOSIS — C3411 Malignant neoplasm of upper lobe, right bronchus or lung: Secondary | ICD-10-CM | POA: Diagnosis present

## 2024-06-22 DIAGNOSIS — Z87891 Personal history of nicotine dependence: Secondary | ICD-10-CM | POA: Diagnosis not present

## 2024-06-22 LAB — CMP (CANCER CENTER ONLY)
ALT: 27 U/L (ref 0–44)
AST: 26 U/L (ref 15–41)
Albumin: 4.5 g/dL (ref 3.5–5.0)
Alkaline Phosphatase: 59 U/L (ref 38–126)
Anion gap: 12 (ref 5–15)
BUN: 14 mg/dL (ref 8–23)
CO2: 25 mmol/L (ref 22–32)
Calcium: 9.6 mg/dL (ref 8.9–10.3)
Chloride: 105 mmol/L (ref 98–111)
Creatinine: 0.78 mg/dL (ref 0.44–1.00)
GFR, Estimated: >60 mL/min (ref >60)
Glucose, Bld: 158 mg/dL — ABNORMAL HIGH (ref 70–99)
Potassium: 4 mmol/L (ref 3.5–5.1)
Sodium: 142 mmol/L (ref 135–145)
Total Bilirubin: 0.3 mg/dL (ref 0.0–1.2)
Total Protein: 6.8 g/dL (ref 6.5–8.1)

## 2024-06-22 LAB — CBC WITH DIFFERENTIAL (CANCER CENTER ONLY)
Abs Immature Granulocytes: 0.01 K/uL (ref 0.00–0.07)
Basophils Absolute: 0 K/uL (ref 0.0–0.1)
Basophils Relative: 1 %
Eosinophils Absolute: 0 K/uL (ref 0.0–0.5)
Eosinophils Relative: 1 %
HCT: 34.6 % — ABNORMAL LOW (ref 36.0–46.0)
Hemoglobin: 11.2 g/dL — ABNORMAL LOW (ref 12.0–15.0)
Immature Granulocytes: 0 %
Lymphocytes Relative: 49 %
Lymphs Abs: 2.3 K/uL (ref 0.7–4.0)
MCH: 28.5 pg (ref 26.0–34.0)
MCHC: 32.4 g/dL (ref 30.0–36.0)
MCV: 88 fL (ref 80.0–100.0)
Monocytes Absolute: 0.4 K/uL (ref 0.1–1.0)
Monocytes Relative: 9 %
Neutro Abs: 1.8 K/uL (ref 1.7–7.7)
Neutrophils Relative %: 40 %
Platelet Count: 344 K/uL (ref 150–400)
RBC: 3.93 MIL/uL (ref 3.87–5.11)
RDW: 14.5 % (ref 11.5–15.5)
WBC Count: 4.6 K/uL (ref 4.0–10.5)
nRBC: 0 % (ref 0.0–0.2)

## 2024-06-22 NOTE — Progress Notes (Signed)
 This NN met with the pt today at his/her consult appt with Dr. Sherrod. Pt was accompanied by her sister, [name]. The plan for the pt is for consults with Rad Onc and CT surgery. Pt scheduled to see Rad Onc on 12/10. Orders for PFTs and consult with CT surgery placed. Plan for f/u in 4mos with CT scan. NN informs pt that she will arrange those appts for her. NN provided pt with direct contact information and encouraged pt call with any questions or concerns. NN escorted pt to scheduling to make f/u lab and MD appt. No questions or concerns voiced by pt at this time.

## 2024-06-22 NOTE — Progress Notes (Signed)
  [facility] [phone]   [phone]  CONSULT NOTE  REFERRING PHYSICIAN: Dr. Zola Herter  REASON FOR CONSULTATION:  68 years old African-American female recently diagnosed with lung cancer.  HPI [name] is a 68 y.o. female with past medical history significant for anxiety, osteoarthritis, coronary artery disease, depression, diabetes mellitus, dyslipidemia, myocardial infarction, peripheral vascular disease and long history of smoking but quit in October 2013.  The patient has a known right lower lobe lung nodule that was incidentally seen on CT angiogram scan of the chest on March 10, 2021 when she presented with shortness of breath and it was done to rule out pulmonary embolism.  It showed a 0.9 cm ground glass nodule in the right lower lobe.  This nodule was followed by regular imaging studies and on April 21, 2024 she had CT scan of the chest performed and that showed Interval enlargement of subsolid nodule of the peripheral right upper lobe measuring 1.4 x 1.0 cm.  There was also interval enlargement of a subsolid nodule of the anterior right lower lobe abutting the fissure and measuring 1.4 x 1.2 cm and the finding were consistent with multifocal adenocarcinoma.  On June 04, 2024 the patient Underwent video bronchoscopy with endobronchial ultrasound and electromagnetic navigation procedure under the care of Dr. Herter.  The final pathology ([id number]) of the fine-needle aspiration of the right upper lobe as well as the right lower lobe was consistent with adenocarcinoma.  MRI of the brain on June 10, 2024 showed no acute intracranial hemorrhage or acute infarction and no evidence for intracranial metastatic disease taken into consideration the limited assessment because of lack of IV contrast.  The patient had a PET scan performed few days ago but the final report is still pending.  She was referred to me today for evaluation and  recommendation regarding treatment of her condition.  HPI  Discussed the use of AI scribe software for clinical note transcription with the patient, who gave verbal consent to proceed.  History of Present Illness [name] is a 68 year old female with recently diagnosed lung cancer who presents for initial oncology consultation. She is accompanied by her sister, Rock.  In August 2022, she experienced shortness of breath, prompting a CT scan that revealed a 0.9 cm ground glass nodule in the right lower lobe of her lung. This nodule was monitored annually until October 2025, when it increased in size to 1.4 by 1.2 cm. Additionally, a new nodule measuring 1.4 by 1.0 cm was identified in the right upper lobe. Subsequent bronchoscopy and biopsy confirmed both nodules as adenocarcinoma, non-small cell lung cancer.  An MRI of the brain in November 2025 was performed without contrast. A PET scan was performed last week, but the report is still pending. She feels fine today with no significant complaints except for some chest congestion, which she attributes to the recent bronchoscopy. No chest pain, shortness of breath, hemoptysis, nausea, vomiting, diarrhea, or abdominal pain. She experiences frontal headaches but denies any changes in vision.  Her past medical history includes a heart attack in 2013, diabetes, high cholesterol, anxiety, depression, poor circulation, and discoid lupus. She takes metformin, which she associates with episodes of diarrhea. She has a significant smoking history, having smoked for 37 years before quitting 13 years ago.  She has a family history of lung cancer in her mother, who passed away at 34, and a half-sister with lung, colon, and kidney cancer. Her father  had a stroke, heart disease, and high blood pressure, and her maternal grandmother had colon cancer. The patient is single with no children.  She used to work in the [employer].  She has a history of smoking for  around 37 years and quit 13 years ago.  She also has a history of alcohol abuse as well as smoking marijuana but not recently.    Past Medical History:  Diagnosis Date   Anginal pain    Anxiety    Arthritis    Bence Jones proteinuria 02/29/2012   Random urine M spike 7.1% 02/13/12   CAD (coronary artery disease)    Remote PCI to RCA in 2001, stent to the RCA in 2002, with last cath in 2010 with placement of two overlapping drug eluting  stents to the RCA   Cutaneous lupus erythematosus 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   Depression    Diabetes mellitus without complication (HCC)    type 2 dx july 2018   H/O: substance abuse (HCC)    Hyperlipidemia    Mass on back    Myocardial infarction [facility])    not sure; nobody ever tells me (04/23/2012)   Noncompliance    PVD (peripheral vascular disease)    MILD      Past Surgical History:  Procedure Laterality Date   ABDOMINAL HYSTERECTOMY  1999   ovaries removed weeks later   ANTERIOR CRUCIATE LIGAMENT REPAIR  lasy February 02 2015   have had 15 surgeries on my right ACL   BREAST SURGERY     removed knot from one side   BRONCHIAL BRUSHINGS  06/04/2024   Procedure: BRONCHOSCOPY, WITH BRUSH BIOPSY;  Surgeon: Zaida Zola SAILOR, MD;  Location: [facility];  Service: Pulmonary;;   BRONCHIAL NEEDLE ASPIRATION BIOPSY  06/04/2024   Procedure: BRONCHOSCOPY, WITH NEEDLE ASPIRATION BIOPSY;  Surgeon: Zaida Zola SAILOR, MD;  Location: [facility];  Service: Pulmonary;;   BRONCHIAL WASHINGS  06/04/2024   Procedure: IRRIGATION, BRONCHUS;  Surgeon: Zaida Zola SAILOR, MD;  Location: [facility];  Service: Pulmonary;;   CORONARY ANGIOPLASTY WITH STENT PLACEMENT  2002; 2010; 04/23/2012   1 + 2 + 1; total of 4   EXCISIONAL HEMORRHOIDECTOMY  yrs ago   HERNIA REPAIR     points to stomach   LEFT HEART CATHETERIZATION WITH CORONARY ANGIOGRAM N/A 04/23/2012   Procedure: LEFT HEART CATHETERIZATION WITH CORONARY ANGIOGRAM;  Surgeon: Lonni JONETTA Cash, MD;  Location: [facility]  CATH LAB;  Service: Cardiovascular;  Laterality: N/A;   MASS EXCISION N/A 08/29/2017   Procedure: EXCISIONAL BIOPSY OF BACK SOFT TISSUE MASS;  Surgeon: Tanda Locus, MD;  Location: [facility] ORS;  Service: General;  Laterality: N/A;   TONSILLECTOMY  early teens   VIDEO BRONCHOSCOPY WITH ENDOBRONCHIAL NAVIGATION Right 06/04/2024   Procedure: VIDEO BRONCHOSCOPY WITH ENDOBRONCHIAL NAVIGATION;  Surgeon: Zaida Zola SAILOR, MD;  Location: [facility];  Service: Pulmonary;  Laterality: Right;    Family History  Problem Relation Age of Onset   Hypertension Mother    Lung cancer Mother    Heart disease Father    Hypertension Father    Stroke Father    Heart failure Father    Colon cancer Maternal Grandmother     Social History Social History   Tobacco Use   Smoking status: Former    Current packs/day: 0.00    Average packs/day: 1.5 packs/day for 38.0 years (57.0 ttl pk-yrs)    Types: Cigarettes    Start date: 04/20/1974    Quit date: 04/20/2012  Years since quitting: 12.1   Smokeless tobacco: Never  Vaping Use   Vaping status: Never Used  Substance Use Topics   Alcohol use: Yes    Alcohol/week: 7.0 standard drinks of alcohol    Types: 7 Cans of beer per week   Drug use: Yes    Types: Marijuana    Comment: daily    Allergies  Allergen Reactions   Chantix [Varenicline] Other (See Comments)    Current Outpatient Medications  Medication Sig Dispense Refill   acetaminophen  (TYLENOL ) 500 MG tablet Take 1,000 mg by mouth every 6 (six) hours as needed for mild pain. For pain     aspirin  81 MG tablet Take 81 mg by mouth at bedtime.     atorvastatin  (LIPITOR) 20 MG tablet Take 1 tablet (20 mg total) by mouth daily. 90 tablet 3   betamethasone  dipropionate 0.05 % lotion Apply 1 Application topically as needed.     Cholecalciferol (HM VITAMIN D3) 100 MCG (4000 UT) CAPS Take by mouth daily.     cyclobenzaprine (FLEXERIL) 10 MG tablet Take 10 mg by mouth 3 (three) times daily as needed.      ezetimibe  (ZETIA ) 10 MG tablet TAKE 1 TABLET BY MOUTH EVERY DAY 90 tablet 3   hydroxychloroquine  (PLAQUENIL ) 200 MG tablet Take 200 mg by mouth at bedtime.     ibuprofen (ADVIL) 400 MG tablet Take 400 mg by mouth daily as needed for moderate pain (pain score 4-6).     LORazepam (ATIVAN) 0.5 MG tablet as needed.      metFORMIN (GLUCOPHAGE-XR) 500 MG 24 hr tablet Take 500 mg by mouth 3 (three) times daily. (Patient taking differently: Take 500 mg by mouth in the morning, at noon, in the evening, and at bedtime.)     metoprolol  succinate (TOPROL -XL) 25 MG 24 hr tablet TAKE 1 TABLET (25 MG TOTAL) BY MOUTH DAILY. 90 tablet 3   Multiple Vitamin (MULTIVITAMIN) tablet Take 1 tablet by mouth daily.     nitroGLYCERIN  (NITROSTAT ) 0.4 MG SL tablet PLACE 1 TABLET (0.4 MG TOTAL) UNDER THE TONGUE EVERY 5 (FIVE) MINUTES AS NEEDED. FOR CHEST PAIN 25 tablet 3   omeprazole (PRILOSEC) 40 MG capsule Take 40 mg by mouth daily as needed.     triamcinolone  ointment (KENALOG) 0.1 % Apply 1 application topically daily as needed (lupus flares).     zolpidem  (AMBIEN ) 10 MG tablet Take 10 mg by mouth at bedtime as needed for sleep.     No current facility-administered medications for this visit.    Review of Systems  Constitutional: negative Eyes: negative Ears, nose, mouth, throat, and face: negative Respiratory: negative Cardiovascular: negative Gastrointestinal: negative Genitourinary:negative Integument/breast: negative Hematologic/lymphatic: negative Musculoskeletal:negative Neurological: negative Behavioral/Psych: negative Endocrine: negative Allergic/Immunologic: negative  Physical Exam  MJO:jozmu, healthy, no distress, well nourished, and well developed SKIN: skin color, texture, turgor are normal, no rashes or significant lesions HEAD: Normocephalic, No masses, lesions, tenderness or abnormalities EYES: normal, PERRLA, Conjunctiva are pink and non-injected EARS: External ears normal, Canals  clear OROPHARYNX:no exudate, no erythema, and lips, buccal mucosa, and tongue normal  NECK: supple, no adenopathy, no JVD LYMPH:  no palpable lymphadenopathy, no hepatosplenomegaly BREAST:not examined LUNGS: clear to auscultation , and palpation HEART: regular rate & rhythm, no murmurs, and no gallops ABDOMEN:abdomen soft, non-tender, normal bowel sounds, and no masses or organomegaly BACK: Back symmetric, no curvature., No CVA tenderness EXTREMITIES:no joint deformities, effusion, or inflammation, no edema  NEURO: alert & oriented x 3 with fluent  speech, no focal motor/sensory deficits  PERFORMANCE STATUS: ECOG 1  LABORATORY DATA: Lab Results  Component Value Date   WBC 4.6 06/22/2024   HGB 11.2 (L) 06/22/2024   HCT 34.6 (L) 06/22/2024   MCV 88.0 06/22/2024   PLT 344 06/22/2024      Chemistry      Component Value Date/Time   NA 142 06/22/2024 1334   NA 141 03/26/2012 1004   K 4.0 06/22/2024 1334   K 4.4 03/26/2012 1004   CL 105 06/22/2024 1334   CL 107 03/26/2012 1004   CO2 25 06/22/2024 1334   CO2 22 03/26/2012 1004   BUN 14 06/22/2024 1334   BUN 17.0 03/26/2012 1004   CREATININE 0.78 06/22/2024 1334   CREATININE 0.8 03/26/2012 1004      Component Value Date/Time   CALCIUM  9.6 06/22/2024 1334   CALCIUM  9.6 03/26/2012 1004   ALKPHOS 59 06/22/2024 1334   ALKPHOS 62 03/26/2012 1004   AST 26 06/22/2024 1334   AST 31 03/26/2012 1004   ALT 27 06/22/2024 1334   ALT 27 03/26/2012 1004   BILITOT 0.3 06/22/2024 1334   BILITOT 0.30 03/26/2012 1004       RADIOGRAPHIC STUDIES: MR BRAIN WO CONTRAST Result Date: 06/15/2024 EXAM: MRI BRAIN WITHOUT CONTRAST 06/09/2024 05:27:00 PM TECHNIQUE: Multiplanar multisequence MRI of the head/brain was performed without the administration of intravenous contrast. COMPARISON: CT head 01/31/2008. CLINICAL HISTORY: Primary adenocarcinoma of right lung. FINDINGS: BRAIN AND VENTRICLES: No acute infarct. No intracranial hemorrhage. No mass  effect. No midline shift. No hydrocephalus. Punctate focus of susceptibility left occipital lobe may represent the sequela of chronic microhemorrhage. There is mild subcortical and periventricular T2 and FLAIR signal hyperintensity likely reflecting sequelae of chronic microvascular ischemia. There are a couple additional small nonspecific foci of T2/FLAIR hyperintensity within the pons. There is mild generalized parenchymal volume loss. The sella is unremarkable. Normal flow voids. ORBITS: No acute abnormality. SINUSES AND MASTOIDS: No acute abnormality. BONES AND SOFT TISSUES: There is heterogeneous marrow signal of the clivus and partially visualized upper cervical spine. No acute soft tissue abnormality. IMPRESSION: 1. No acute intracranial hemorrhage or acute infarction. 2. Lack of IV contrast limits assessment for intracranial metastatic disease. No evidence of mass effect or cerebral edema. Electronically signed by: prentice spade 06/15/2024 01:29 PM EST RP Workstation: [id number]   DG Chest Port 1 View Result Date: 06/04/2024 EXAM: 1 VIEW(S) XRAY OF THE CHEST 06/04/2024 09:58:00 AM COMPARISON: CT chest 05/20/2024 and chest x ray 05/31/2021. CLINICAL HISTORY: [id number] S/P bronchoscopy [id number] S/P bronchoscopy. FINDINGS: LUNGS AND PLEURA: Lungs are adequately inflated. Mild focal opacification of the right upper lobe likely resulting from patient's recent bronchoscopy with biopsy. No pleural effusion. No pneumothorax. HEART AND MEDIASTINUM: No acute abnormality of the cardiac and mediastinal silhouettes. BONES AND SOFT TISSUES: No acute osseous abnormality. No acute soft tissue abnormality. IMPRESSION: 1. Mild focal opacification of the right upper lobe, likely post-procedural from recent bronchoscopy with biopsy. 2. No acute airspace consolidation or pleural effusion. Electronically signed by: Toribio Agreste MD 06/04/2024 10:18 AM EST RP Workstation: [id number]   DG C-ARM BRONCHOSCOPY Result Date:  06/04/2024 C-ARM BRONCHOSCOPY: Fluoroscopy was utilized by the requesting physician.  No radiographic interpretation.   DG C-Arm 1-60 Min-No Report Result Date: 06/04/2024 Fluoroscopy was utilized by the requesting physician.  No radiographic interpretation.    ASSESSMENT: This is a very pleasant 68 years old African-American female with a stage IIIa (T4, N0, M0) non-small cell lung cancer presented with  right upper lobe as well as right lower lobe lung nodule diagnosed in November 2025. This could be also considered synchronous stage IA (T1b, N0, M0 (adenocarcinoma involving the right upper lobe and right lower lobe. Molecular studies are still pending.  PLAN: I had a lengthy discussion with the patient and her sister today about her current disease stage, prognosis and treatment options.  I personally and independently reviewed the imaging studies as well as the pathology showed and discussed with them with the patient and her sister. Assessment and Plan Assessment & Plan Non-small cell lung cancer, right upper and lower lobes (adenocarcinoma) Non-small cell lung cancer, adenocarcinoma, with nodules in the right upper and lower lobes. Initially identified as ground glass nodules, now confirmed as adenocarcinoma via biopsy. The nodules have increased in size since initial detection. MRI brain showed no concerning findings for metastasis. PET scan results are pending. The cancer is classified as stage IIIA due to the presence of two nodules in different lobes, but will be treated as two stage I cancers. Surgical resection is preferred if feasible, but radiation is a viable alternative if surgery is not possible due to respiratory or cardiac concerns. She expressed reluctance towards surgery due to concerns about incisions, but is open to discussing options with the surgeon. The likelihood of radiation therapy is much higher than surgery. - Ordered pulmonary function test to assess surgical  candidacy. - Scheduled appointment with surgeon to discuss surgical options. - Scheduled appointment with radiation oncologist to discuss radiation therapy. - Ordered molecular marker testing on biopsy material. - Plan follow-up in four months with repeat imaging to assess treatment response. The patient was advised to call immediately if she has any other concerning symptoms in the interval.  The patient voices understanding of current disease status and treatment options and is in agreement with the current care plan.  All questions were answered. The patient knows to call the clinic with any problems, questions or concerns. We can certainly see the patient much sooner if necessary.  Thank you so much for allowing me to participate in the care of [name]. I will continue to follow up the patient with you and assist in her care.  The total time spent in the appointment was 60 minutes including review of chart and various tests results, discussions about plan of care and coordination of care plan .  Disclaimer: This note was dictated with voice recognition software. Similar sounding words can inadvertently be transcribed and may not be corrected upon review.   Sherrod MARLA Sherrod June 22, 2024, 2:11 PM

## 2024-06-23 NOTE — Progress Notes (Signed)
 Email sent to [facility] and [facility]en [facility], [facility] to arrange PFT appt.

## 2024-06-23 NOTE — Progress Notes (Signed)
 Radiation Oncology         [phone] ________________________________  Initial Outpatient Consultation  Name: [name] MRN: [id number]  Date: 06/24/2024  DOB: 03-Sep-1955  RR:[id number], Elsie SAUNDERS, MD  Hattar, Zola SAILOR, MD   REFERRING PHYSICIAN: Zaida Zola SAILOR, MD  DIAGNOSIS: There were no encounter diagnoses.  Primary adenocarcinoma of upper lobe of right lung (HCC)   HISTORY OF PRESENT ILLNESS::[name] is a 68 y.o. female who is accompanied by ***. she is seen as a courtesy of Dr. Zaida for an opinion concerning radiation therapy as part of management for her recently diagnosed lung cancer. Patient has a smoking history since she was 68 years old but had quit in 2013. She currently smokes marijuana.   The patient has a history of lung nodules first identified in 2022 during a CT scan performed for shortness of breath and concern for pulmonary embolism. The initial scan revealed a 9 mm ground glass opacity in the right lower lobe. Subsequent CT scans in 2023 and 2024 demonstrated slow growth of the nodules, with the right lower lobe nodule increasing to 1.1 cm and a new 5 mm nodule appearing in the right upper lobe. By 2025, both nodules had grown to approximately 1.4 cm. Dr. Zaida believes nodules are suggestive of non-aggressive malignancy.  A chest CT performed on 05/22/24 showed somewhat thick-walled right upper lobe atypical pulmonary cyst and ground-glass right lower lobe nodule, stable in the short interval from 04/21/2024 but enlarged from 01/17/2022, worrisome for indolent adenocarcinomas.   Subsequently, she underwent a video bronchoscopy with EBUS on 06/04/24 under the care of Dr. Zaida. Surgical pathology showing a malignant cells consistent with adenocarcinoma.   [place] MRI performed on 11//25/25 showed no acute intracranial hemorrhage or acute infarction however lack of IV contrast limits assessment for intracranial metastatic disease. No evidence of mass  effect or cerebral edema was indicated on scan. PET scan done on 06/19/24 showed the solid right upper lobe nodule demonstrates low level metabolic activity along with th ground-glass nodule anteriorly in the right lower lobe also demonstrates minimal metabolic activity, both consistent with biopsy-proven adenocarcinoma.   Most recent visit with Dr. Gatha on 06/15/24 they opted to proceed with surgical and radiation consults along with pulmonary function test to assess surgical candidacy and further molecular marker testing on tumor before deciding on a treatment plan.   Patient case was presented to the tumor board on 06/18/24.   PREVIOUS RADIATION THERAPY: No  PAST MEDICAL HISTORY:  Past Medical History:  Diagnosis Date   Anginal pain    Anxiety    Arthritis    Bence Jones proteinuria 02/29/2012   Random urine M spike 7.1% 02/13/12   CAD (coronary artery disease)    Remote PCI to RCA in 2001, stent to the RCA in 2002, with last cath in 2010 with placement of two overlapping drug eluting  stents to the RCA   Cutaneous lupus erythematosus 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   Depression    Diabetes mellitus without complication (HCC)    type 2 dx july 2018   H/O: substance abuse (HCC)    Hyperlipidemia    Mass on back    Myocardial infarction [facility])    not sure; nobody ever tells me (04/23/2012)   Noncompliance    PVD (peripheral vascular disease)    MILD    PAST SURGICAL HISTORY: Past Surgical History:  Procedure Laterality Date   ABDOMINAL HYSTERECTOMY  1999   ovaries removed weeks later  ANTERIOR CRUCIATE LIGAMENT REPAIR  lasy February 02 2015   have had 15 surgeries on my right ACL   BREAST SURGERY     removed knot from one side   BRONCHIAL BRUSHINGS  06/04/2024   Procedure: BRONCHOSCOPY, WITH BRUSH BIOPSY;  Surgeon: Zaida Zola SAILOR, MD;  Location: [facility];  Service: Pulmonary;;   BRONCHIAL NEEDLE ASPIRATION BIOPSY  06/04/2024   Procedure: BRONCHOSCOPY, WITH NEEDLE ASPIRATION  BIOPSY;  Surgeon: Zaida Zola SAILOR, MD;  Location: [facility];  Service: Pulmonary;;   BRONCHIAL WASHINGS  06/04/2024   Procedure: IRRIGATION, BRONCHUS;  Surgeon: Zaida Zola SAILOR, MD;  Location: [facility] ENDOS[facility];  Service: Pulmonary;;   CORONARY ANGIOPLASTY WITH STENT PLACEMENT  2002; 2010; 04/23/2012   1 + 2 + 1; total of 4   EXCISIONAL HEMORRHOIDECTOMY  yrs ago   HERNIA REPAIR     points to stomach   LEFT HEART CATHETERIZATION WITH CORONARY ANGIOGRAM N/A 04/23/2012   Procedure: LEFT HEART CATHETERIZATION WITH CORONARY ANGIOGRAM;  Surgeon: Lonni JONETTA Cash, MD;  Location: [facility] CATH LAB;  Service: Cardiovascular;  Laterality: N/A;   MASS EXCISION N/A 08/29/2017   Procedure: EXCISIONAL BIOPSY OF BACK SOFT TISSUE MASS;  Surgeon: Tanda Locus, MD;  Location: [facility] ORS;  Service: General;  Laterality: N/A;   TONSILLECTOMY  early teens   VIDEO BRONCHOSCOPY WITH ENDOBRONCHIAL NAVIGATION Right 06/04/2024   Procedure: VIDEO BRONCHOSCOPY WITH ENDOBRONCHIAL NAVIGATION;  Surgeon: Zaida Zola SAILOR, MD;  Location: [facility];  Service: Pulmonary;  Laterality: Right;    FAMILY HISTORY:  Family History  Problem Relation Age of Onset   Hypertension Mother    Lung cancer Mother    Heart disease Father    Hypertension Father    Stroke Father    Heart failure Father    Colon cancer Maternal Grandmother     SOCIAL HISTORY:  Social History   Tobacco Use   Smoking status: Former    Current packs/day: 0.00    Average packs/day: 1.5 packs/day for 38.0 years (57.0 ttl pk-yrs)    Types: Cigarettes    Start date: 04/20/1974    Quit date: 04/20/2012    Years since quitting: 12.1   Smokeless tobacco: Never  Vaping Use   Vaping status: Never Used  Substance Use Topics   Alcohol use: Yes    Alcohol/week: 7.0 standard drinks of alcohol    Types: 7 Cans of beer per week   Drug use: Yes    Types: Marijuana    Comment: daily    ALLERGIES:  Allergies  Allergen Reactions   Chantix [Varenicline] Other  (See Comments)    MEDICATIONS:  Current Outpatient Medications  Medication Sig Dispense Refill   acetaminophen  (TYLENOL ) 500 MG tablet Take 1,000 mg by mouth every 6 (six) hours as needed for mild pain. For pain     aspirin  81 MG tablet Take 81 mg by mouth at bedtime.     atorvastatin  (LIPITOR) 20 MG tablet Take 1 tablet (20 mg total) by mouth daily. 90 tablet 3   betamethasone  dipropionate 0.05 % lotion Apply 1 Application topically as needed.     Cholecalciferol (HM VITAMIN D3) 100 MCG (4000 UT) CAPS Take by mouth daily.     cyclobenzaprine (FLEXERIL) 10 MG tablet Take 10 mg by mouth 3 (three) times daily as needed.     ezetimibe  (ZETIA ) 10 MG tablet TAKE 1 TABLET BY MOUTH EVERY DAY 90 tablet 3   hydroxychloroquine  (PLAQUENIL ) 200 MG tablet Take 200 mg by mouth at bedtime.  ibuprofen (ADVIL) 400 MG tablet Take 400 mg by mouth daily as needed for moderate pain (pain score 4-6).     LORazepam (ATIVAN) 0.5 MG tablet as needed.      metFORMIN (GLUCOPHAGE-XR) 500 MG 24 hr tablet Take 500 mg by mouth 3 (three) times daily. (Patient taking differently: Take 500 mg by mouth in the morning, at noon, in the evening, and at bedtime.)     metoprolol  succinate (TOPROL -XL) 25 MG 24 hr tablet TAKE 1 TABLET (25 MG TOTAL) BY MOUTH DAILY. 90 tablet 3   Multiple Vitamin (MULTIVITAMIN) tablet Take 1 tablet by mouth daily.     nitroGLYCERIN  (NITROSTAT ) 0.4 MG SL tablet PLACE 1 TABLET (0.4 MG TOTAL) UNDER THE TONGUE EVERY 5 (FIVE) MINUTES AS NEEDED. FOR CHEST PAIN 25 tablet 3   omeprazole (PRILOSEC) 40 MG capsule Take 40 mg by mouth daily as needed.     triamcinolone  ointment (KENALOG) 0.1 % Apply 1 application topically daily as needed (lupus flares).     zolpidem  (AMBIEN ) 10 MG tablet Take 10 mg by mouth at bedtime as needed for sleep.     No current facility-administered medications for this visit.    REVIEW OF SYSTEMS:  A 10+ POINT REVIEW OF SYSTEMS WAS OBTAINED including neurology, dermatology,  psychiatry, cardiac, respiratory, lymph, extremities, GI, GU, musculoskeletal, constitutional, reproductive, HEENT. ***   PHYSICAL EXAM:  vitals were not taken for this visit.   General: Alert and oriented, in no acute distress HEENT: Head is normocephalic. Extraocular movements are intact. Oropharynx is clear. Neck: Neck is supple, no palpable cervical or supraclavicular lymphadenopathy. Heart: Regular in rate and rhythm with no murmurs, rubs, or gallops. Chest: Clear to auscultation bilaterally, with no rhonchi, wheezes, or rales. Abdomen: Soft, nontender, nondistended, with no rigidity or guarding. Extremities: No cyanosis or edema. Lymphatics: see Neck Exam Skin: No concerning lesions. Musculoskeletal: symmetric strength and muscle tone throughout. Neurologic: Cranial nerves II through XII are grossly intact. No obvious focalities. Speech is fluent. Coordination is intact. Psychiatric: Judgment and insight are intact. Affect is appropriate. ***  ECOG = ***  0 - Asymptomatic (Fully active, able to carry on all predisease activities without restriction)  1 - Symptomatic but completely ambulatory (Restricted in physically strenuous activity but ambulatory and able to carry out work of a light or sedentary nature. For example, light housework, office work)  2 - Symptomatic, <50% in bed during the day (Ambulatory and capable of all self care but unable to carry out any work activities. Up and about more than 50% of waking hours)  3 - Symptomatic, >50% in bed, but not bedbound (Capable of only limited self-care, confined to bed or chair 50% or more of waking hours)  4 - Bedbound (Completely disabled. Cannot carry on any self-care. Totally confined to bed or chair)  5 - Death   Raylene MM, Creech RH, Tormey DC, et al. [phone]). Toxicity and response criteria of the [facility]. Am. DOROTHA Bridges. Oncol. 5 (6): 649-55  LABORATORY DATA:  Lab Results  Component Value Date    WBC 4.6 06/22/2024   HGB 11.2 (L) 06/22/2024   HCT 34.6 (L) 06/22/2024   MCV 88.0 06/22/2024   PLT 344 06/22/2024   NEUTROABS 1.8 06/22/2024   Lab Results  Component Value Date   NA 142 06/22/2024   K 4.0 06/22/2024   CL 105 06/22/2024   CO2 25 06/22/2024   GLUCOSE 158 (H) 06/22/2024   BUN 14 06/22/2024   CREATININE  0.78 06/22/2024   CALCIUM  9.6 06/22/2024      RADIOGRAPHY: NM PET Image Initial (PI) Skull Base To Thigh Result Date: 06/23/2024 CLINICAL DATA:  Initial treatment strategy for right lung adenocarcinoma. Enlarging right upper and lower lobe lesions on recent CT, both positive for malignancy on bronchoscopy 06/04/2024. EXAM: NUCLEAR MEDICINE PET SKULL BASE TO THIGH TECHNIQUE: 6.08 mCi F-18 FDG was injected intravenously. Full-ring PET imaging was performed from the skull base to thigh after the radiotracer. CT data was obtained and used for attenuation correction and anatomic localization. Fasting blood glucose: 101 mg/dl COMPARISON:  Chest CT 88/94/7974, 04/21/2024 and 04/22/2023. No prior PET-CT. FINDINGS: Mediastinal blood pool activity: SUV max 2.0 NECK: No hypermetabolic cervical lymph nodes are identified. No suspicious activity identified within the pharyngeal mucosal space. Incidental CT findings: Bilateral carotid atherosclerosis. CHEST: There are no hypermetabolic mediastinal, hilar or axillary lymph nodes. Part solid right upper lobe nodule appears slightly more dense (possibly related to recent biopsy), measuring 1.2 x 1.0 cm on image 23/7. Associated low level metabolic activity (SUV max 1.7). Ground-glass nodule anteriorly in the right lower lobe appears similar, measuring 1.2 x 0.8 cm on image 37/7. This demonstrates minimal metabolic activity (SUV max 1.1). No hypermetabolic activity or suspicious activity within the left lung. Incidental CT findings: Diffuse atherosclerosis of the aorta, great vessels and coronary arteries. Stable bandlike scarring in the right  middle lobe. ABDOMEN/PELVIS: There is no hypermetabolic activity within the liver, adrenal glands, spleen or pancreas. There is no hypermetabolic nodal activity in the abdomen or pelvis. Prominent bowel activity, within physiologic limits. Incidental CT findings: Punctate nonobstructing bilateral renal calculi. No evidence of ureteral calculus or hydronephrosis. Distal colonic diverticulosis without evidence of acute inflammation. Aortic and branch vessel atherosclerosis. SKELETON: There is no hypermetabolic activity to suggest osseous metastatic disease. There is some contrast extravasation at the injection site in the right antecubital fossa. Incidental CT findings: Thoracolumbar scoliosis and mild spondylosis. IMPRESSION: 1. The part solid right upper lobe nodule demonstrates low level metabolic activity, consistent with biopsy-proven adenocarcinoma. 2. The ground-glass nodule anteriorly in the right lower lobe demonstrates minimal metabolic activity, also consistent with biopsy-proven adenocarcinoma. 3. No evidence of metastatic disease. 4. Coronary and aortic Atherosclerosis (ICD10-I70.0). Electronically Signed   By: Elsie Perone M.D.   On: 06/23/2024 11:39   MR BRAIN WO CONTRAST Result Date: 06/15/2024 EXAM: MRI BRAIN WITHOUT CONTRAST 06/09/2024 05:27:00 PM TECHNIQUE: Multiplanar multisequence MRI of the head/brain was performed without the administration of intravenous contrast. COMPARISON: CT head 01/31/2008. CLINICAL HISTORY: Primary adenocarcinoma of right lung. FINDINGS: BRAIN AND VENTRICLES: No acute infarct. No intracranial hemorrhage. No mass effect. No midline shift. No hydrocephalus. Punctate focus of susceptibility left occipital lobe may represent the sequela of chronic microhemorrhage. There is mild subcortical and periventricular T2 and FLAIR signal hyperintensity likely reflecting sequelae of chronic microvascular ischemia. There are a couple additional small nonspecific foci of T2/FLAIR  hyperintensity within the pons. There is mild generalized parenchymal volume loss. The sella is unremarkable. Normal flow voids. ORBITS: No acute abnormality. SINUSES AND MASTOIDS: No acute abnormality. BONES AND SOFT TISSUES: There is heterogeneous marrow signal of the clivus and partially visualized upper cervical spine. No acute soft tissue abnormality. IMPRESSION: 1. No acute intracranial hemorrhage or acute infarction. 2. Lack of IV contrast limits assessment for intracranial metastatic disease. No evidence of mass effect or cerebral edema. Electronically signed by: prentice spade 06/15/2024 01:29 PM EST RP Workstation: [id number]   DG Chest Port 1 View Result Date:  06/04/2024 EXAM: 1 VIEW(S) XRAY OF THE CHEST 06/04/2024 09:58:00 AM COMPARISON: CT chest 05/20/2024 and chest x ray 05/31/2021. CLINICAL HISTORY: [id number] S/P bronchoscopy [id number] S/P bronchoscopy. FINDINGS: LUNGS AND PLEURA: Lungs are adequately inflated. Mild focal opacification of the right upper lobe likely resulting from patient's recent bronchoscopy with biopsy. No pleural effusion. No pneumothorax. HEART AND MEDIASTINUM: No acute abnormality of the cardiac and mediastinal silhouettes. BONES AND SOFT TISSUES: No acute osseous abnormality. No acute soft tissue abnormality. IMPRESSION: 1. Mild focal opacification of the right upper lobe, likely post-procedural from recent bronchoscopy with biopsy. 2. No acute airspace consolidation or pleural effusion. Electronically signed by: Toribio Agreste MD 06/04/2024 10:18 AM EST RP Workstation: [id number]   DG C-ARM BRONCHOSCOPY Result Date: 06/04/2024 C-ARM BRONCHOSCOPY: Fluoroscopy was utilized by the requesting physician.  No radiographic interpretation.   DG C-Arm 1-60 Min-No Report Result Date: 06/04/2024 Fluoroscopy was utilized by the requesting physician.  No radiographic interpretation.      IMPRESSION: Primary adenocarcinoma of upper lobe of right lung (HCC)   ***  Today, I  talked to the patient and family about the findings and work-up thus far.  We discussed the natural history of *** and general treatment, highlighting the role of radiotherapy in the management.  We discussed the available radiation techniques, and focused on the details of logistics and delivery.  We reviewed the anticipated acute and late sequelae associated with radiation in this setting.  The patient was encouraged to ask questions that I answered to the best of my ability. *** A patient consent form was discussed and signed.  We retained a copy for our records.  The patient would like to proceed with radiation and will be scheduled for CT simulation.  PLAN: ***    *** minutes of total time was spent for this patient encounter, including preparation, face-to-face counseling with the patient and coordination of care, physical exam, and documentation of the encounter.   ------------------------------------------------  Lynwood CHARM Nasuti, PhD, MD  This document serves as a record of services personally performed by Lynwood Nasuti, MD. It was created on his behalf by Reymundo Cartwright, a trained medical scribe. The creation of this record is based on the scribe's personal observations and the provider's statements to them. This document has been checked and approved by the attending provider.

## 2024-06-23 NOTE — Progress Notes (Signed)
 Thoracic Location of Tumor / Histology: Right Lung upper/lower lobe  Patient presented as referral from Dr. Sherrod ([facility]-Medical Oncology)  Biopsies   Past/Anticipated interventions by pulmonary, if any: Lauraine Lites, NP   Past/Anticipated interventions by cardiothoracic surgery, if any: NA  Past/Anticipated interventions by medical oncology, if any:  Dr. Sherrod   Tobacco/Marijuana/Snuff/ETOH use: Former tobacco use, marijuana and alcohol use.  Signs/Symptoms Weight changes, if any: {:18581} Respiratory complaints, if any: {:18581} Hemoptysis, if any: {:18581} Pain issues, if any:  {:18581}  SAFETY ISSUES: Prior radiation? {:18581} Pacemaker/ICD? {:18581}  Possible current pregnancy?  Hysterectomy Is the patient on methotrexate? No  Current Complaints / other details:     30 minutes spent total, including time for meaningful use questions, reviewing medication, as well as spent in face-to-face time in nurse evaluation with the patient.

## 2024-06-23 NOTE — Progress Notes (Signed)
 NN reached out to reading room and requested PET scan be read and report be available in time for pt's Rad Onc consult on 12/10. Spoke to [place] who states she has changed the read status to Urgent.

## 2024-06-24 ENCOUNTER — Ambulatory Visit
Admission: RE | Admit: 2024-06-24 | Discharge: 2024-06-24 | Attending: Radiation Oncology | Admitting: Radiation Oncology

## 2024-06-24 ENCOUNTER — Encounter: Payer: Self-pay | Admitting: Radiation Oncology

## 2024-06-24 VITALS — BP 139/79 | HR 70 | Temp 97.8°F | Resp 20 | Ht 59.0 in | Wt 122.0 lb

## 2024-06-24 DIAGNOSIS — C3411 Malignant neoplasm of upper lobe, right bronchus or lung: Secondary | ICD-10-CM

## 2024-06-29 ENCOUNTER — Encounter (HOSPITAL_COMMUNITY): Payer: Self-pay

## 2024-07-01 ENCOUNTER — Encounter (HOSPITAL_COMMUNITY): Payer: Self-pay | Admitting: Internal Medicine

## 2024-07-03 LAB — FUNGUS CULTURE RESULT

## 2024-07-03 LAB — FUNGAL ORGANISM REFLEX

## 2024-07-03 LAB — FUNGUS CULTURE WITH STAIN

## 2024-07-06 ENCOUNTER — Ambulatory Visit: Payer: Self-pay | Admitting: Acute Care

## 2024-07-06 ENCOUNTER — Encounter: Payer: Self-pay | Admitting: Internal Medicine

## 2024-07-06 ENCOUNTER — Ambulatory Visit: Admitting: Internal Medicine

## 2024-07-06 VITALS — BP 126/85 | HR 81 | Temp 97.9°F | Ht 59.0 in | Wt 120.0 lb

## 2024-07-06 DIAGNOSIS — J069 Acute upper respiratory infection, unspecified: Secondary | ICD-10-CM

## 2024-07-06 DIAGNOSIS — Z87891 Personal history of nicotine dependence: Secondary | ICD-10-CM

## 2024-07-06 DIAGNOSIS — C3491 Malignant neoplasm of unspecified part of right bronchus or lung: Secondary | ICD-10-CM

## 2024-07-06 LAB — POCT INFLUENZA A/B
Influenza A, POC: NEGATIVE
Influenza B, POC: NEGATIVE

## 2024-07-06 MED ORDER — HYDROCOD POLI-CHLORPHE POLI ER 10-8 MG/5ML PO SUER: 5.0000 mL | Freq: Two times a day (BID) | ORAL | 0 refills | Status: AC | PRN

## 2024-07-06 MED ORDER — AZITHROMYCIN 250 MG PO TABS: 250.0000 mg | ORAL_TABLET | Freq: Every day | ORAL | 0 refills | Status: DC

## 2024-07-06 NOTE — Telephone Encounter (Signed)
 Patient has appointment today to see dr.desai will assess at visit

## 2024-07-06 NOTE — Addendum Note (Signed)
 Addended by: MOODY RANCHER on: 07/06/2024 10:56 AM   Modules accepted: Orders

## 2024-07-06 NOTE — Progress Notes (Signed)
 [address]              [name]    [id number]    Dec 12, 1955  Primary Care Physician:Harris, Elsie SAUNDERS, MD Date of Appointment: 07/06/2024 Established Patient Visit  Chief complaint:   Chief Complaint  Patient presents with   Cough    Coughing up mucus beige/brown in color with sore throat.     HPI: [name] is a 68 y.o. woman with CAD and history of tobacco use disorder. Recent bronchoscopy revealed adenocarcinoma of the RUL and RLL of the lung. Quit smoking 2012.  Interval Updates: Discussed the use of AI scribe software for clinical note transcription with the patient, who gave verbal consent to proceed.  History of Present Illness [name] is a 68 year old female with lung cancer who presents with a sore throat and cough.  She has been experiencing a sore throat with a burning sensation since Thursday, accompanied by a cough and brown mucus production. She reports that she does not smoke and quit 13 years ago. She also reports feeling cold and having a sensation of her tongue scraping, which she attributes to possibly being cut by her teeth.  She has been taking Mucinex since Saturday morning but did not take it today. No shortness of breath, chest tightness, or wheezing, but she mentions a headache and congestion in her head. 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  Her past medical history includes lung cancer, heart disease, and lupus. She expresses concern about taking medications due to her heart disease. She has not been around anyone known to be sick recently and has not taken any recent flu or COVID tests.   Past Medical History:  Diagnosis Date   Anginal pain    Anxiety    Arthritis    Bence Jones proteinuria 02/29/2012   Random urine M spike 7.1% 02/13/12   CAD (coronary artery disease)    Remote PCI to RCA in 2001, stent to the RCA in 2002, with last cath in 2010 with placement of two overlapping drug eluting   stents to the RCA   Cutaneous lupus erythematosus 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   Depression    Diabetes mellitus without complication (HCC)    type 2 dx july 2018   H/O: substance abuse (HCC)    Hyperlipidemia    Mass on back    Myocardial infarction [facility])    not sure; nobody ever tells me (04/23/2012)   Noncompliance    PVD (peripheral vascular disease)    MILD    Past Surgical History:  Procedure Laterality Date   ABDOMINAL HYSTERECTOMY  1999   ovaries removed weeks later   ANTERIOR CRUCIATE LIGAMENT REPAIR  lasy February 02 2015   have had 15 surgeries on my right ACL   BREAST SURGERY     removed knot from one side   BRONCHIAL BRUSHINGS  06/04/2024   Procedure: BRONCHOSCOPY, WITH BRUSH BIOPSY;  Surgeon: Zaida Zola SAILOR, MD;  Location: [facility];  Service: Pulmonary;;   BRONCHIAL NEEDLE ASPIRATION BIOPSY  06/04/2024   Procedure: BRONCHOSCOPY, WITH NEEDLE ASPIRATION BIOPSY;  Surgeon: Zaida Zola SAILOR, MD;  Location: [facility];  Service: Pulmonary;;   BRONCHIAL WASHINGS  06/04/2024   Procedure: IRRIGATION, BRONCHUS;  Surgeon: Zaida Zola SAILOR, MD;  Location: [facility];  Service: Pulmonary;;   CORONARY ANGIOPLASTY WITH STENT PLACEMENT  2002; 2010; 04/23/2012   1 + 2 + 1; total of 4  EXCISIONAL HEMORRHOIDECTOMY  yrs ago   HERNIA REPAIR     points to stomach   LEFT HEART CATHETERIZATION WITH CORONARY ANGIOGRAM N/A 04/23/2012   Procedure: LEFT HEART CATHETERIZATION WITH CORONARY ANGIOGRAM;  Surgeon: Lonni JONETTA Cash, MD;  Location: [facility] CATH LAB;  Service: Cardiovascular;  Laterality: N/A;   MASS EXCISION N/A 08/29/2017   Procedure: EXCISIONAL BIOPSY OF BACK SOFT TISSUE MASS;  Surgeon: Tanda Locus, MD;  Location: [facility] ORS;  Service: General;  Laterality: N/A;   TONSILLECTOMY  early teens   VIDEO BRONCHOSCOPY WITH ENDOBRONCHIAL NAVIGATION Right 06/04/2024   Procedure: VIDEO BRONCHOSCOPY WITH ENDOBRONCHIAL NAVIGATION;  Surgeon: Zaida Zola SAILOR, MD;  Location: [facility];   Service: Pulmonary;  Laterality: Right;    Family History  Problem Relation Age of Onset   Hypertension Mother    Lung cancer Mother    Heart disease Father    Hypertension Father    Stroke Father    Heart failure Father    Colon cancer Maternal Grandmother     Social History   Occupational History   Not on file  Tobacco Use   Smoking status: Former    Current packs/day: 0.00    Average packs/day: 1.5 packs/day for 38.0 years (57.0 ttl pk-yrs)    Types: Cigarettes    Start date: 04/20/1974    Quit date: 04/20/2012    Years since quitting: 12.2   Smokeless tobacco: Never  Vaping Use   Vaping status: Never Used  Substance and Sexual Activity   Alcohol use: Yes    Alcohol/week: 7.0 standard drinks of alcohol    Types: 7 Cans of beer per week   Drug use: Yes    Types: Marijuana    Comment: daily   Sexual activity: Never     Physical Exam: Blood pressure 126/85, pulse 81, temperature 97.9 F (36.6 C), temperature source Oral, height 4' 11 (1.499 m), weight 120 lb (54.4 kg), SpO2 97%.  Gen:      No acute distress ENT:  no nasal polyps, mucus membranes moist Lungs:    No increased respiratory effort, symmetric chest wall excursion, clear to auscultation bilaterally, no wheezes or crackles CV:         Regular rate and rhythm; no murmurs, rubs, or gallops.  No pedal edema   Data Reviewed: Imaging: I have personally reviewed the pet ct shows a RUL and RLL nodule  PFTs:      No data to display         I have personally reviewed the patient's PFTs and non one file  Labs: Lab Results  Component Value Date   NA 142 06/22/2024   K 4.0 06/22/2024   CO2 25 06/22/2024   GLUCOSE 158 (H) 06/22/2024   BUN 14 06/22/2024   CREATININE 0.78 06/22/2024   CALCIUM  9.6 06/22/2024   GFR 109.30 12/06/2014   GFRNONAA >60 06/22/2024   Lab Results  Component Value Date   WBC 4.6 06/22/2024   HGB 11.2 (L) 06/22/2024   HCT 34.6 (L) 06/22/2024   MCV 88.0 06/22/2024   PLT  344 06/22/2024    Immunization status: Immunization History  Administered Date(s) Administered   Influenza Inj Mdck Quad Pf 03/21/2018, 04/22/2019   Influenza, Seasonal, Injecte, Preservative Fre 04/16/2023   Influenza,inj,Quad PF,6+ Mos 04/22/2022   Influenza-Unspecified 04/04/2012, 04/15/2024   PFIZER(Purple Top)SARS-COV-2 Vaccination 04/30/2020   Pfizer(Comirnaty)Fall Seasonal Vaccine 12 years and older 04/22/2022, 04/16/2023   Pneumococcal-Unspecified 04/15/2024   Unspecified SARS-COV-2 Vaccination 04/15/2024  Assessment and Plan Assessment & Plan Acute upper respiratory infection Suspected viral etiology.  - Performed rapid viral test for flu which was negative - Prescribed cough syrup for sleep aid if beneficial. - Avoid decongestants like pseudoephedrine due to heart disease. - azithromycin  x 5 days  NSCLC, new diagnosis - follow with thoracic surgery, medical oncology, radiation oncology for treatment plan    Return to Care: Return in about 3 months (around 10/04/2024) for Dr Zaida.   Verdon Gore, MD Pulmonary and Critical Care Medicine [facility] [phone]      "

## 2024-07-06 NOTE — Patient Instructions (Signed)
 It was a pleasure to see you today!  Please schedule follow up with Dr Zaida in 3 months. Please call sooner [phone] if issues or concerns arise. You can also send us  a message through MyChart, but but aware that this is not to be used for urgent issues and it may take up to 5-7 days to receive a reply. Please be aware that you will likely be able to view your results before I have a chance to respond to them. Please give us  5 business days to respond to any non-urgent results.    -ACUTE UPPER RESPIRATORY INFECTION: An acute upper respiratory infection is a viral infection that affects the nose, throat, and airways. We performed rapid viral tests for flu and COVID-19 to determine the cause. You were prescribed cough syrup to help you sleep if needed. Please avoid decongestants like pseudoephedrine due to your heart disease. Your Flu test was negative. I have prescribed azithromyin antibiotic to your pharmacy.

## 2024-07-06 NOTE — Telephone Encounter (Signed)
 [email or web address] Pulmonary Triage - Initial Assessment Questions Chief Complaint (e.g., cough, sob, wheezing, fever, chills, sweat or additional symptoms) *Go to specific symptom protocol after initial questions. cough  How long have symptoms been present? 4 days  Have you tested for COVID or Flu? Note: If not, ask patient if a home test can be taken. If so, instruct patient to call back for positive results. No  MEDICINES:   Have you used any OTC meds to help with symptoms? Yes If yes, ask What medications? Mucinex DM  OXYGEN: Do you wear supplemental oxygen? No  FYI Only or Action Required?: FYI only for provider: appointment scheduled on today.  Called Nurse Triage reporting Cough.  Symptoms began several days ago.  Interventions attempted: OTC medications: mucinex DM.  Symptoms are: gradually worsening.  Triage Disposition: See HCP Within 4 Hours (Or PCP Triage)  Patient/caregiver understands and will follow disposition?: Copied from CRM #[id number]. Topic: Clinical - Red Word Triage >> Jul 06, 2024  8:13 AM Leila C wrote: Red Word that prompted transfer to Nurse Triage: Patient [phone] states sore throat started last Thursday, burning sensation, and finally was able to cough up phlegm is beige color to green, thick, and smells, raspy/hoarseness in the throat, body aches, headaches, tongue has been irritated like cut/scrapped, puffy and redness in eyes. Patient started using Mucinex. Patient denies fever, pain, swelling, shortness of breath, nor light headed. Patient states NP, Groce advised patient to contact the office if her phelgm has color. Patient wants to know if she needs to be seen with pulmonologist or primary care. Please advise. Reason for Disposition  [1] Fever > 100 F (37.8 C) AND [2] diabetes mellitus or weak immune system (e.g., HIV positive, cancer chemo, splenectomy, organ transplant, chronic steroids)  Answer Assessment - Initial Assessment Questions 1.  ONSET: When did the cough begin?      4 days ago started with a sore throat 2. SEVERITY: How bad is the cough today?      moderate 3. SPUTUM: Describe the color of your sputum (e.g., none, dry cough; clear, white, yellow, green)     Green, thick, odorous 4. HEMOPTYSIS: Are you coughing up any blood? If Yes, ask: How much? (e.g., flecks, streaks, tablespoons, etc.)     Denies 5. DIFFICULTY BREATHING: Are you having difficulty breathing? If Yes, ask: How bad is it? (e.g., mild, moderate, severe)      denies 6. FEVER: Do you have a fever? If Yes, ask: What is your temperature, how was it measured, and when did it start?     denies 7. CARDIAC HISTORY: Do you have any history of heart disease? (e.g., heart attack, congestive heart failure)      Heart disease, MI 8. LUNG HISTORY: Do you have any history of lung disease?  (e.g., pulmonary embolus, asthma, emphysema)     Lung CA 10. OTHER SYMPTOMS: Do you have any other symptoms? (e.g., runny nose, wheezing, chest pain)       Cough, sore throat  Mucinex DM has been helping, but not completely resolving s/s.  Protocols used: Cough - Acute Productive-A-AH

## 2024-07-13 ENCOUNTER — Other Ambulatory Visit: Payer: Self-pay

## 2024-07-13 DIAGNOSIS — I10 Essential (primary) hypertension: Secondary | ICD-10-CM

## 2024-07-14 MED ORDER — METOPROLOL SUCCINATE ER 25 MG PO TB24: 25.0000 mg | ORAL_TABLET | Freq: Every day | ORAL | 1 refills | Status: AC

## 2024-07-17 ENCOUNTER — Other Ambulatory Visit: Payer: Self-pay | Admitting: Thoracic Surgery (Cardiothoracic Vascular Surgery)

## 2024-07-17 ENCOUNTER — Ambulatory Visit
Attending: Thoracic Surgery (Cardiothoracic Vascular Surgery) | Admitting: Thoracic Surgery (Cardiothoracic Vascular Surgery)

## 2024-07-17 ENCOUNTER — Encounter: Payer: Self-pay | Admitting: Thoracic Surgery (Cardiothoracic Vascular Surgery)

## 2024-07-17 VITALS — BP 109/72 | HR 80 | Resp 20 | Ht 59.0 in | Wt 121.0 lb

## 2024-07-17 DIAGNOSIS — C3411 Malignant neoplasm of upper lobe, right bronchus or lung: Secondary | ICD-10-CM

## 2024-07-17 NOTE — Progress Notes (Signed)
 "     [address] [place] [address]             [phone]                   [name] [facility] Medical Record #[id number] Date of Birth: [birth year]  Referring: Sherrod Sherrod, MD Primary Care: Krista Elsie SAUNDERS, MD Primary Cardiologist: Lonni Cash, MD  Chief Complaint:    Chief Complaint  Patient presents with   Lung Cancer    New patient consult, review all studies    History of Present Illness:    [name] is a 69 y.o. female who presents for surgical evaluation of a right upper lobe, and right lower lobe biopsy-proven non-small cell lung cancer.  She quit smoking in 2013, and has been enrolled in lung cancer screening program, where these nodules were being surveilled.  And her most recent scans there was an increase in size of these nodules.  She denies any shortness of breath, weight changes or neurologic symptoms   Zubrod Score: At the time of surgery this patients most appropriate activity status/level should be described as: [x]     0    Normal activity, no symptoms []     1    Restricted in physical strenuous activity but ambulatory, able to do out light work []     2    Ambulatory and capable of self care, unable to do work activities, up and about               >50 % of waking hours                              []     3    Only limited self care, in bed greater than 50% of waking hours []     4    Completely disabled, no self care, confined to bed or chair []     5    Moribund     Past Medical History:  Diagnosis Date   Anginal pain    Anxiety    Arthritis    Bence Jones proteinuria 02/29/2012   Random urine M spike 7.1% 02/13/12   CAD (coronary artery disease)    Remote PCI to RCA in 2001, stent to the RCA in 2002, with last cath in 2010 with placement of two overlapping drug eluting  stents to the RCA   Cutaneous lupus erythematosus 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   Depression    Diabetes mellitus without complication  (HCC)    type 2 dx july 2018   H/O: substance abuse (HCC)    Hyperlipidemia    Mass on back    Myocardial infarction [facility])    not sure; nobody ever tells me (04/23/2012)   Noncompliance    PVD (peripheral vascular disease)    MILD    Past Surgical History:  Procedure Laterality Date   ABDOMINAL HYSTERECTOMY  1999   ovaries removed weeks later   ANTERIOR CRUCIATE LIGAMENT REPAIR  lasy February 02 2015   have had 15 surgeries on my right ACL   BREAST SURGERY     removed knot from one side   BRONCHIAL BRUSHINGS  06/04/2024   Procedure: BRONCHOSCOPY, WITH BRUSH BIOPSY;  Surgeon: Zaida Zola SAILOR, MD;  Location: [facility];  Service: Pulmonary;;   BRONCHIAL NEEDLE ASPIRATION BIOPSY  06/04/2024   Procedure: BRONCHOSCOPY,  WITH NEEDLE ASPIRATION BIOPSY;  Surgeon: Zaida Zola SAILOR, MD;  Location: [facility] ENDOSCOPY;  Service: Pulmonary;;   BRONCHIAL WASHINGS  06/04/2024   Procedure: IRRIGATION, BRONCHUS;  Surgeon: Zaida Zola SAILOR, MD;  Location: [facility];  Service: Pulmonary;;   CORONARY ANGIOPLASTY WITH STENT PLACEMENT  2002; 2010; 04/23/2012   1 + 2 + 1; total of 4   EXCISIONAL HEMORRHOIDECTOMY  yrs ago   HERNIA REPAIR     points to stomach   LEFT HEART CATHETERIZATION WITH CORONARY ANGIOGRAM N/A 04/23/2012   Procedure: LEFT HEART CATHETERIZATION WITH CORONARY ANGIOGRAM;  Surgeon: Lonni JONETTA Cash, MD;  Location: [facility] CATH LAB;  Service: Cardiovascular;  Laterality: N/A;   MASS EXCISION N/A 08/29/2017   Procedure: EXCISIONAL BIOPSY OF BACK SOFT TISSUE MASS;  Surgeon: Tanda Locus, MD;  Location: [facility];  Service: General;  Laterality: N/A;   TONSILLECTOMY  early teens   VIDEO BRONCHOSCOPY WITH ENDOBRONCHIAL NAVIGATION Right 06/04/2024   Procedure: VIDEO BRONCHOSCOPY WITH ENDOBRONCHIAL NAVIGATION;  Surgeon: Zaida Zola SAILOR, MD;  Location: [facility];  Service: Pulmonary;  Laterality: Right;    Family History  Problem Relation Age of Onset   Hypertension Mother    Lung cancer  Mother    Heart disease Father    Hypertension Father    Stroke Father    Heart failure Father    Colon cancer Maternal Grandmother      Tobacco Use History[1]  Social History   Substance and Sexual Activity  Alcohol Use Yes   Alcohol/week: 7.0 standard drinks of alcohol   Types: 7 Cans of beer per week     Allergies[2]  Current Outpatient Medications  Medication Sig Dispense Refill   acetaminophen  (TYLENOL ) 500 MG tablet Take 1,000 mg by mouth every 6 (six) hours as needed for mild pain. For pain     aspirin  81 MG tablet Take 81 mg by mouth at bedtime.     atorvastatin  (LIPITOR) 20 MG tablet Take 1 tablet (20 mg total) by mouth daily. 90 tablet 3   azithromycin  (ZITHROMAX ) 250 MG tablet Take 1 tablet (250 mg total) by mouth daily. 6 tablet 0   betamethasone  dipropionate 0.05 % lotion Apply 1 Application topically as needed.     chlorpheniramine-HYDROcodone  (TUSSIONEX) 10-8 MG/5ML Take 5 mLs by mouth every 12 (twelve) hours as needed for cough. 115 mL 0   Cholecalciferol (HM VITAMIN D3) 100 MCG (4000 UT) CAPS Take by mouth daily.     cyclobenzaprine (FLEXERIL) 10 MG tablet Take 10 mg by mouth 3 (three) times daily as needed.     ezetimibe  (ZETIA ) 10 MG tablet TAKE 1 TABLET BY MOUTH EVERY DAY 90 tablet 3   hydroxychloroquine  (PLAQUENIL ) 200 MG tablet Take 200 mg by mouth at bedtime.     ibuprofen (ADVIL) 400 MG tablet Take 400 mg by mouth daily as needed for moderate pain (pain score 4-6).     LORazepam (ATIVAN) 0.5 MG tablet as needed.      metFORMIN (GLUCOPHAGE-XR) 500 MG 24 hr tablet Take 500 mg by mouth 3 (three) times daily.     metoprolol  succinate (TOPROL -XL) 25 MG 24 hr tablet Take 1 tablet (25 mg total) by mouth daily. 90 tablet 1   Multiple Vitamin (MULTIVITAMIN) tablet Take 1 tablet by mouth daily.     nitroGLYCERIN  (NITROSTAT ) 0.4 MG SL tablet PLACE 1 TABLET (0.4 MG TOTAL) UNDER THE TONGUE EVERY 5 (FIVE) MINUTES AS NEEDED. FOR CHEST PAIN 25 tablet 3   omeprazole  (PRILOSEC) 40  MG capsule Take 40 mg by mouth daily as needed.     triamcinolone  ointment (KENALOG) 0.1 % Apply 1 application topically daily as needed (lupus flares).     zolpidem  (AMBIEN ) 10 MG tablet Take 10 mg by mouth at bedtime as needed for sleep.     No current facility-administered medications for this visit.    Review of Systems  Constitutional:  Negative for malaise/fatigue.  Respiratory:  Negative for cough and shortness of breath.   Cardiovascular:  Negative for chest pain.  Neurological: Negative.      PHYSICAL EXAMINATION: BP 109/72 (BP Location: Left Arm, Patient Position: Sitting, Cuff Size: Normal)   Pulse 80   Resp 20   Ht 4' 11 (1.499 m)   Wt 121 lb (54.9 kg)   SpO2 96% Comment: RA  BMI 24.44 kg/m  Physical Exam Constitutional:      General: She is not in acute distress.    Appearance: She is not ill-appearing.  HENT:     Head: Normocephalic and atraumatic.  Eyes:     Extraocular Movements: Extraocular movements intact.  Cardiovascular:     Rate and Rhythm: Normal rate.  Pulmonary:     Effort: Pulmonary effort is normal. No respiratory distress.  Abdominal:     General: Abdomen is flat. There is no distension.  Musculoskeletal:        General: Normal range of motion.     Cervical back: Normal range of motion.  Skin:    General: Skin is warm and dry.  Neurological:     General: No focal deficit present.     Mental Status: She is alert and oriented to person, place, and time.          I have independently reviewed the above radiology studies  and reviewed the findings with the patient.   Recent Lab Findings:  Diagnostic Studies & Laboratory data:     Recent Radiology Findings:   NM PET Image Initial (PI) Skull Base To Thigh Result Date: 06/23/2024 CLINICAL DATA:  Initial treatment strategy for right lung adenocarcinoma. Enlarging right upper and lower lobe lesions on recent CT, both positive for malignancy on bronchoscopy 06/04/2024.  EXAM: NUCLEAR MEDICINE PET SKULL BASE TO THIGH TECHNIQUE: 6.08 mCi F-18 FDG was injected intravenously. Full-ring PET imaging was performed from the skull base to thigh after the radiotracer. CT data was obtained and used for attenuation correction and anatomic localization. Fasting blood glucose: 101 mg/dl COMPARISON:  Chest CT 88/94/7974, 04/21/2024 and 04/22/2023. No prior PET-CT. FINDINGS: Mediastinal blood pool activity: SUV max 2.0 NECK: No hypermetabolic cervical lymph nodes are identified. No suspicious activity identified within the pharyngeal mucosal space. Incidental CT findings: Bilateral carotid atherosclerosis. CHEST: There are no hypermetabolic mediastinal, hilar or axillary lymph nodes. Part solid right upper lobe nodule appears slightly more dense (possibly related to recent biopsy), measuring 1.2 x 1.0 cm on image 23/7. Associated low level metabolic activity (SUV max 1.7). Ground-glass nodule anteriorly in the right lower lobe appears similar, measuring 1.2 x 0.8 cm on image 37/7. This demonstrates minimal metabolic activity (SUV max 1.1). No hypermetabolic activity or suspicious activity within the left lung. Incidental CT findings: Diffuse atherosclerosis of the aorta, great vessels and coronary arteries. Stable bandlike scarring in the right middle lobe. ABDOMEN/PELVIS: There is no hypermetabolic activity within the liver, adrenal glands, spleen or pancreas. There is no hypermetabolic nodal activity in the abdomen or pelvis. Prominent bowel activity, within physiologic limits. Incidental CT findings: Punctate nonobstructing bilateral renal  calculi. No evidence of ureteral calculus or hydronephrosis. Distal colonic diverticulosis without evidence of acute inflammation. Aortic and branch vessel atherosclerosis. SKELETON: There is no hypermetabolic activity to suggest osseous metastatic disease. There is some contrast extravasation at the injection site in the right antecubital fossa. Incidental  CT findings: Thoracolumbar scoliosis and mild spondylosis. IMPRESSION: 1. The part solid right upper lobe nodule demonstrates low level metabolic activity, consistent with biopsy-proven adenocarcinoma. 2. The ground-glass nodule anteriorly in the right lower lobe demonstrates minimal metabolic activity, also consistent with biopsy-proven adenocarcinoma. 3. No evidence of metastatic disease. 4. Coronary and aortic Atherosclerosis (ICD10-I70.0). Electronically Signed   By: Elsie Perone M.D.   On: 06/23/2024 11:39     PFTs:  Pending  FINAL MICROSCOPIC DIAGNOSIS:  A. LUNG, RUL NODULE, FINE NEEDLE ASPIRATION  BIOPSY:  - Positive for malignancy.  - Adenocarcinoma.   B. LUNG, RUL NODULE, BRUSHING:  - Positive for malignancy.  - Adenocarcinoma.   C. LUNG, RLL NODULE TARGET 2, FINE NEEDLE ASPIRATION:  - Positive for malignancy.  - Adenocarcinoma.   Assessment / Plan:   69 y.o. female with T4 N0 M0 non-small cell lung cancer of the right lung.  On my review of her imaging she appears to have 3 separate nodules in the right upper lobe, the largest of which was biopsied and consistent with adenocarcinoma.  She also had a solitary nodule in the right lower lobe which appears close to the fissure.  Pulmonary functions are pending, thus I cannot define her surgical candidacy at this point.  If she has good pulmonary functions, 1 option would be to perform a right upper lobectomy, and a wedge resection of the right lower lobe.  I will follow-up with her after her pulmonary function testing.  If she like to proceed with surgery she also would require stress testing.   I  spent 60 minutes with  the patient face to face in counseling and coordination of care.    [name] 07/17/2024 11:00 AM           [1]  Social History Tobacco Use  Smoking Status Former   Current packs/day: 0.00   Average packs/day: 1.5 packs/day for 38.0 years (57.0 ttl pk-yrs)   Types: Cigarettes   Start  date: 04/20/1974   Quit date: 04/20/2012   Years since quitting: 12.2  Smokeless Tobacco Never  [2]  Allergies Allergen Reactions   Chantix [Varenicline] Other (See Comments)   "

## 2024-07-18 LAB — ACID FAST CULTURE WITH REFLEXED SENSITIVITIES (MYCOBACTERIA): Acid Fast Culture: NEGATIVE

## 2024-07-31 ENCOUNTER — Ambulatory Visit (HOSPITAL_COMMUNITY)
Admission: RE | Admit: 2024-07-31 | Discharge: 2024-07-31 | Disposition: A | Discharge status: Home / Self Care | Source: Ambulatory Visit | Attending: Thoracic Surgery (Cardiothoracic Vascular Surgery) | Admitting: Thoracic Surgery (Cardiothoracic Vascular Surgery)

## 2024-07-31 DIAGNOSIS — C3411 Malignant neoplasm of upper lobe, right bronchus or lung: Secondary | ICD-10-CM | POA: Insufficient documentation

## 2024-07-31 LAB — PULMONARY FUNCTION TEST
DL/VA % pred: 70 %
DL/VA: 3.04 ml/min/mmHg/L
DLCO unc % pred: 53 %
DLCO unc: 8.79 ml/min/mmHg
FEF 25-75 Post: 1.14 L/s
FEF 25-75 Pre: 1.23 L/s
FEF2575-%Change-Post: -7 %
FEF2575-%Pred-Post: 62 %
FEF2575-%Pred-Pre: 68 %
FEV1-%Change-Post: 0 %
FEV1-%Pred-Post: 72 %
FEV1-%Pred-Pre: 72 %
FEV1-Post: 1.4 L
FEV1-Pre: 1.41 L
FEV1FVC-%Change-Post: 1 %
FEV1FVC-%Pred-Pre: 101 %
FEV6-%Change-Post: -1 %
FEV6-%Pred-Post: 73 %
FEV6-%Pred-Pre: 74 %
FEV6-Post: 1.78 L
FEV6-Pre: 1.81 L
FEV6FVC-%Pred-Post: 104 %
FEV6FVC-%Pred-Pre: 104 %
FVC-%Change-Post: -1 %
FVC-%Pred-Post: 69 %
FVC-%Pred-Pre: 71 %
FVC-Post: 1.78 L
FVC-Pre: 1.81 L
Post FEV1/FVC ratio: 79 %
Post FEV6/FVC ratio: 100 %
Pre FEV1/FVC ratio: 78 %
Pre FEV6/FVC Ratio: 100 %
RV % pred: 85 %
RV: 1.58 L
TLC % pred: 78 %
TLC: 3.37 L

## 2024-07-31 MED ORDER — ALBUTEROL SULFATE (2.5 MG/3ML) 0.083% IN NEBU
2.5000 mg | INHALATION_SOLUTION | Freq: Once | RESPIRATORY_TRACT | Status: AC
  Administered 2024-07-31: 2.5 mg via RESPIRATORY_TRACT

## 2024-08-11 ENCOUNTER — Ambulatory Visit: Admitting: Physician Assistant

## 2024-08-11 ENCOUNTER — Encounter: Payer: Self-pay | Admitting: Emergency Medicine

## 2024-08-11 ENCOUNTER — Ambulatory Visit: Attending: Emergency Medicine | Admitting: Emergency Medicine

## 2024-08-11 VITALS — BP 130/68 | HR 70 | Ht 59.0 in | Wt 116.0 lb

## 2024-08-11 DIAGNOSIS — Z01810 Encounter for preprocedural cardiovascular examination: Secondary | ICD-10-CM | POA: Diagnosis not present

## 2024-08-11 DIAGNOSIS — I739 Peripheral vascular disease, unspecified: Secondary | ICD-10-CM | POA: Diagnosis not present

## 2024-08-11 NOTE — Progress Notes (Signed)
 " Cardiology Office Note:    Date:  08/11/2024  ID:  [name], DOB 02-18-1956, MRN [id number] PCP: Arloa Elsie SAUNDERS, MD  [facility] Providers Cardiologist:  Lonni Cash, MD PV Cardiologist:  Deatrice Cage, MD       Patient Profile:       Chief Complaint: 1 year follow-up History of Present Illness:  [name] is a 69 y.o. female with visit-pertinent history of coronary artery disease, former tobacco abuse, cutaneous lupus, anxiety/depression, hyperlipidemia, peripheral arterial disease, T2DM  She has history of PCI of RCA in 2002.  Cardiac catheterization in 2010 showed her RCA was subtotally occluded distally.  She then had 2 overlapping drug-eluting stents placed to the distal RCA with a total of 3 stents in the RCA.  Stress test in 2013 in the setting of chest pain showed possible lateral wall ischemia.  Cardiac catheterization in October 2013 showed severe stenosis in the OM treated with bare-metal stent.  Stress test in August 2016 showed no ischemia.  Echocardiogram in December 2021 showed LVEF 60 to 65%, no significant valvular disease.  Myoview  in April 2022 showed no evidence of ischemia.  She has history of peripheral arterial disease followed by Dr. Cage.  She was initially seen in 2020 for mild bilateral calf claudication. Noninvasive study showed right ABI 0.88, left ABI 0.83.  Duplex showed a right SFA disease with two-vessel runoff below the knee.  On the left, there was borderline significant left SFA disease with three-vessel runoff below the knee.  Given the fact that her symptom was not lifestyle limiting, therefore it was recommended a walking program and treatment of risk factors.  Repeat Doppler study in May 2024 showed drop of ABI on the right side to 0.78 and is stable on the left side at 0.89.  Duplex of the right lower extremity showed moderate common femoral and the distal SFA disease.  Dr. Cage recommended a structured exercise therapy.    Last seen in clinic on 11/15/2023 by [place], [place].  She was without anginal symptoms.  Did have complaints of right foot pain.  Patient underwent ABIs on 12/13/2023 showing right ankle-brachial index indicated moderate right lower extremity arterial disease with ABIs unchanged compared to 2024 study.  Right arterial duplex 11/2023 showed 50-74% stenosis noted in the superficial femoral artery and total occlusion noted in the posterior tibial artery with no significant changes since previous study.   Discussed the use of AI scribe software for clinical note transcription with the patient, who gave verbal consent to proceed.  History of Present Illness [name] is a 69 year old female with coronary artery disease who presents for a one-year follow-up.  Today patient is doing well without acute cardiovascular concerns or complaints.  She denies chest pain, shortness of breath, orthopnea, lightheadedness, or dizziness.  She remains fairly active without any exertional symptoms.  She has since retired since last office visit and has been less active as previous.  She was recently diagnosed with lung cancer but denies any issues with shortness of breath.  Her medications include aspirin , atorvastatin , ezetimibe  (Zetia ), and metoprolol .  She has not needed nitroglycerin  recently and carries an unopened bottle.  Review of systems:  Please see the history of present illness. All other systems are reviewed and otherwise negative.      Studies Reviewed:    EKG Interpretation Date/Time:  Tuesday August 11 2024 11:38:03 EST Ventricular Rate:  67 PR Interval:  142 QRS Duration:  80 QT Interval:  390 QTC Calculation: 412 R Axis:   15  Text Interpretation: Normal sinus rhythm Possible Left atrial enlargement When compared with ECG of 15-Nov-2023 15:55, No significant change was found Confirmed by Rana Dixon [phone]) on 08/11/2024 12:29:43 PM    Lexiscan  Myoview  10/25/2020 Nuclear stress EF:  62%. The left ventricular ejection fraction is normal (55-65%). T wave inversion was noted during stress in the V3 and V4 leads, and returning to baseline after less than 1 min of recovery. The study is normal with no evidence of ischemia or infarction. This is a low risk study.  Echocardiogram 06/21/2020 1. Left ventricular ejection fraction, by estimation, is 60 to 65%. Left  ventricular ejection fraction by PLAX is 62 %. The left ventricle has  normal function. The left ventricle demonstrates regional wall motion  abnormalities (see scoring  diagram/findings for description). Left ventricular diastolic parameters  are consistent with Grade I diastolic dysfunction (impaired relaxation).   2. Right ventricular systolic function is normal. The right ventricular  size is mildly enlarged. Mildly increased right ventricular wall  thickness.   3. The mitral valve is grossly normal. No evidence of mitral valve  regurgitation.   4. The aortic valve is normal in structure. Aortic valve regurgitation is  not visualized.   Risk Assessment/Calculations:              Physical Exam:   VS:  BP 130/68 (BP Location: Left Arm, Patient Position: Sitting, Cuff Size: Normal)   Pulse 70   Ht 4' 11 (1.499 m)   Wt 116 lb (52.6 kg)   BMI 23.43 kg/m    Wt Readings from Last 3 Encounters:  08/11/24 116 lb (52.6 kg)  07/17/24 121 lb (54.9 kg)  07/06/24 120 lb (54.4 kg)    GEN: Well nourished, well developed in no acute distress NECK: No JVD; No carotid bruits CARDIAC: RRR, no murmurs, rubs, gallops RESPIRATORY:  Clear to auscultation without rales, wheezing or rhonchi  ABDOMEN: Soft, non-tender, non-distended EXTREMITIES:  No edema; No acute deformity      Assessment and Plan:  Coronary artery disease S/p PCI of RCA followed by stenting of RCA in 2002 S/p overlapping DES x 2 to distal RCA (total 3 stents to RCA) in 2010 S/p BMS to OM in 2013 Most recent stress test 10/2020 was without  evidence of ischemia or infarction Echo 06/2020 showed LVEF 60 to 65% - Today patient is stable without chest pains.  She is without symptoms suggestive of angina.  Remains fairly active without exertional symptoms.  No indication further ischemic evaluation at this time - EKG today is without acute ischemic features - Continue aspirin  81 mg daily, atorvastatin  20 mg daily, ezetimibe  10 mg daily, and metoprolol  succinate 25 mg daily  Peripheral arterial disease Most recent Dopplers on 11/2023 showed 50-74% stenosis noted in the superficial femoral artery and/or popliteal artery and total occlusion noted in the posterior tibial artery with no significant change compared to prior study in 2024 - Today she is stable without claudication and remains active - Managed by Dr. Darron  Hypertension Blood pressure today is well-controlled at 130/68 - Continue metoprolol  succinate 25 mg daily  Hyperlipidemia, LDL goal <70 LDL 62 on 04/2024 and well-controlled - Continue atorvastatin  20 mg daily  T2DM A1c 7.5% on 07/2024 - Managed on metformin 500 mg 3 times daily  Adenocarcinoma of right lung Recent bronchoscopy revealed adenocarcinoma of the RUL and RLL of the lung - Recently had surgical  consult with Dr. Shyrl is pending PFTs to define her surgical candidacy      Dispo: Follow-up with Dr. Darron in 6 months and Dr. Verlin in 1 year  Signed, Lum LITTIE Louis, NP  "

## 2024-08-11 NOTE — Patient Instructions (Signed)
 Medication Instructions:  NO CHANGES  Lab Work: NONE TO BE DONE TODAY.  Testing/Procedures: NONE  Follow-Up: At [facility], you and your health needs are our priority.  As part of our continuing mission to provide you with exceptional heart care, our providers are all part of one team.  This team includes your primary Cardiologist (physician) and Advanced Practice Providers or APPs (Physician Assistants and Nurse Practitioners) who all work together to provide you with the care you need, when you need it.  Your next appointment:   6 MONTHS WITH DR.ARIDA FOR PAD 1 YEAR WITH DR. VERLIN   Provider:   Lonni Verlin, MD    Other Instructions:

## 2024-08-14 ENCOUNTER — Ambulatory Visit
Attending: Thoracic Surgery (Cardiothoracic Vascular Surgery) | Admitting: Thoracic Surgery (Cardiothoracic Vascular Surgery)

## 2024-08-14 ENCOUNTER — Telehealth: Payer: Self-pay

## 2024-08-14 DIAGNOSIS — C3411 Malignant neoplasm of upper lobe, right bronchus or lung: Secondary | ICD-10-CM | POA: Diagnosis not present

## 2024-08-14 NOTE — Telephone Encounter (Signed)
 Pt called to inform Dr Sherrod of appt with surgeon today. Pt would like to proceed with radiation.

## 2024-08-14 NOTE — Progress Notes (Signed)
" °   °  [address] [place] [address]             [phone]     Patient: Home Provider: Office Consent for Telemedicine visit obtained.  Todays visit was completed via a real-time telehealth (see specific modality noted below). The patient/authorized person provided oral consent at the time of the visit to engage in a telemedicine encounter with the present provider at [facility]. The patient/authorized person was informed of the potential benefits, limitations, and risks of telemedicine. The patient/authorized person expressed understanding that the laws that protect confidentiality also apply to telemedicine. The patient/authorized person acknowledged understanding that telemedicine does not provide emergency services and that he or she would need to call 911 or proceed to the nearest hospital for help if such a need arose.   Total time spent in the clinical discussion 10 minutes.  Telehealth Modality: Phone visit (audio only)  I had a telephone visit with [name] We reviewed pulmonary functions.  Her DLCO was 53%.  I explained to her that given the fact that we would require a right upper lobectomy as well as has generous lower lobe wedge resection her risk of being on oxygen is significant.  She stated that she would rather proceed with radiation therapy.  She will contact medical oncology to let them know of her decision.  "

## 2024-10-13 ENCOUNTER — Inpatient Hospital Stay: Attending: Internal Medicine

## 2024-10-19 ENCOUNTER — Ambulatory Visit

## 2024-10-20 ENCOUNTER — Inpatient Hospital Stay: Attending: Internal Medicine | Admitting: Internal Medicine
